# Patient Record
Sex: Male | Born: 1935 | Race: White | Hispanic: No | Marital: Married | Smoking: Never smoker
Health system: Southern US, Community
[De-identification: ages and names within clinical notes are randomized; demographics above are authoritative.]

## PROBLEM LIST (undated history)

## (undated) DIAGNOSIS — E785 Hyperlipidemia, unspecified: Secondary | ICD-10-CM

## (undated) DIAGNOSIS — I2510 Atherosclerotic heart disease of native coronary artery without angina pectoris: Secondary | ICD-10-CM

## (undated) DIAGNOSIS — H269 Unspecified cataract: Secondary | ICD-10-CM

## (undated) DIAGNOSIS — K219 Gastro-esophageal reflux disease without esophagitis: Secondary | ICD-10-CM

## (undated) DIAGNOSIS — C801 Malignant (primary) neoplasm, unspecified: Secondary | ICD-10-CM

## (undated) DIAGNOSIS — I10 Essential (primary) hypertension: Secondary | ICD-10-CM

## (undated) DIAGNOSIS — C9110 Chronic lymphocytic leukemia of B-cell type not having achieved remission: Secondary | ICD-10-CM

## (undated) DIAGNOSIS — I509 Heart failure, unspecified: Secondary | ICD-10-CM

## (undated) HISTORY — DX: Heart failure, unspecified: I50.9

## (undated) HISTORY — PX: OTHER SURGICAL HISTORY: SHX169

## (undated) HISTORY — DX: Hyperlipidemia, unspecified: E78.5

## (undated) HISTORY — PX: HERNIA REPAIR: SHX51

## (undated) HISTORY — DX: Gastro-esophageal reflux disease without esophagitis: K21.9

## (undated) HISTORY — DX: Essential (primary) hypertension: I10

## (undated) HISTORY — DX: Malignant (primary) neoplasm, unspecified: C80.1

## (undated) HISTORY — PX: EYE SURGERY: SHX253

## (undated) HISTORY — PX: APPENDECTOMY: SHX54

## (undated) HISTORY — DX: Chronic lymphocytic leukemia of B-cell type not having achieved remission: C91.10

## (undated) HISTORY — DX: Unspecified cataract: H26.9

## (undated) HISTORY — DX: Atherosclerotic heart disease of native coronary artery without angina pectoris: I25.10

---

## 1978-05-30 DIAGNOSIS — M130 Polyarthritis, unspecified: Secondary | ICD-10-CM | POA: Insufficient documentation

## 1998-07-03 ENCOUNTER — Inpatient Hospital Stay (HOSPITAL_COMMUNITY): Admission: AD | Admit: 1998-07-03 | Discharge: 1998-07-08 | Payer: Self-pay | Admitting: Cardiology

## 1998-07-03 ENCOUNTER — Encounter: Payer: Self-pay | Admitting: Cardiology

## 1998-07-07 ENCOUNTER — Encounter: Payer: Self-pay | Admitting: Cardiology

## 2008-02-26 DIAGNOSIS — E559 Vitamin D deficiency, unspecified: Secondary | ICD-10-CM | POA: Insufficient documentation

## 2011-04-14 DIAGNOSIS — D7289 Other specified disorders of white blood cells: Secondary | ICD-10-CM | POA: Insufficient documentation

## 2014-12-09 DIAGNOSIS — C801 Malignant (primary) neoplasm, unspecified: Secondary | ICD-10-CM | POA: Insufficient documentation

## 2014-12-09 DIAGNOSIS — I428 Other cardiomyopathies: Secondary | ICD-10-CM | POA: Insufficient documentation

## 2014-12-09 DIAGNOSIS — E785 Hyperlipidemia, unspecified: Secondary | ICD-10-CM | POA: Insufficient documentation

## 2014-12-09 DIAGNOSIS — I10 Essential (primary) hypertension: Secondary | ICD-10-CM | POA: Insufficient documentation

## 2014-12-09 DIAGNOSIS — I2510 Atherosclerotic heart disease of native coronary artery without angina pectoris: Secondary | ICD-10-CM | POA: Insufficient documentation

## 2014-12-09 DIAGNOSIS — I429 Cardiomyopathy, unspecified: Secondary | ICD-10-CM | POA: Insufficient documentation

## 2014-12-09 HISTORY — DX: Atherosclerotic heart disease of native coronary artery without angina pectoris: I25.10

## 2014-12-09 HISTORY — DX: Hyperlipidemia, unspecified: E78.5

## 2014-12-09 HISTORY — DX: Essential (primary) hypertension: I10

## 2014-12-09 HISTORY — DX: Cardiomyopathy, unspecified: I42.9

## 2016-05-31 DIAGNOSIS — J0100 Acute maxillary sinusitis, unspecified: Secondary | ICD-10-CM | POA: Diagnosis not present

## 2016-09-05 DIAGNOSIS — I2510 Atherosclerotic heart disease of native coronary artery without angina pectoris: Secondary | ICD-10-CM | POA: Diagnosis not present

## 2016-09-05 DIAGNOSIS — E785 Hyperlipidemia, unspecified: Secondary | ICD-10-CM | POA: Diagnosis not present

## 2016-09-05 DIAGNOSIS — I429 Cardiomyopathy, unspecified: Secondary | ICD-10-CM | POA: Diagnosis not present

## 2016-09-05 DIAGNOSIS — I10 Essential (primary) hypertension: Secondary | ICD-10-CM | POA: Diagnosis not present

## 2016-09-13 DIAGNOSIS — E785 Hyperlipidemia, unspecified: Secondary | ICD-10-CM | POA: Diagnosis not present

## 2016-09-13 DIAGNOSIS — I429 Cardiomyopathy, unspecified: Secondary | ICD-10-CM | POA: Diagnosis not present

## 2016-09-13 DIAGNOSIS — I10 Essential (primary) hypertension: Secondary | ICD-10-CM | POA: Diagnosis not present

## 2016-09-13 DIAGNOSIS — I2510 Atherosclerotic heart disease of native coronary artery without angina pectoris: Secondary | ICD-10-CM | POA: Diagnosis not present

## 2016-09-19 DIAGNOSIS — E875 Hyperkalemia: Secondary | ICD-10-CM | POA: Diagnosis not present

## 2016-10-03 DIAGNOSIS — H353121 Nonexudative age-related macular degeneration, left eye, early dry stage: Secondary | ICD-10-CM | POA: Diagnosis not present

## 2016-10-04 DIAGNOSIS — K219 Gastro-esophageal reflux disease without esophagitis: Secondary | ICD-10-CM | POA: Diagnosis not present

## 2016-10-04 DIAGNOSIS — Z79899 Other long term (current) drug therapy: Secondary | ICD-10-CM | POA: Diagnosis not present

## 2016-10-04 DIAGNOSIS — I5023 Acute on chronic systolic (congestive) heart failure: Secondary | ICD-10-CM | POA: Diagnosis not present

## 2016-10-04 DIAGNOSIS — R200 Anesthesia of skin: Secondary | ICD-10-CM | POA: Diagnosis not present

## 2016-10-04 DIAGNOSIS — I429 Cardiomyopathy, unspecified: Secondary | ICD-10-CM | POA: Diagnosis not present

## 2016-10-04 DIAGNOSIS — Z7982 Long term (current) use of aspirin: Secondary | ICD-10-CM | POA: Diagnosis not present

## 2016-10-04 DIAGNOSIS — R0602 Shortness of breath: Secondary | ICD-10-CM | POA: Diagnosis not present

## 2016-10-04 DIAGNOSIS — I509 Heart failure, unspecified: Secondary | ICD-10-CM | POA: Diagnosis not present

## 2016-10-04 DIAGNOSIS — I110 Hypertensive heart disease with heart failure: Secondary | ICD-10-CM | POA: Diagnosis not present

## 2016-10-04 DIAGNOSIS — R008 Other abnormalities of heart beat: Secondary | ICD-10-CM | POA: Diagnosis not present

## 2016-10-04 DIAGNOSIS — I2510 Atherosclerotic heart disease of native coronary artery without angina pectoris: Secondary | ICD-10-CM | POA: Diagnosis not present

## 2016-10-04 DIAGNOSIS — I252 Old myocardial infarction: Secondary | ICD-10-CM | POA: Diagnosis not present

## 2016-10-04 DIAGNOSIS — E7800 Pure hypercholesterolemia, unspecified: Secondary | ICD-10-CM | POA: Diagnosis not present

## 2016-10-04 DIAGNOSIS — I493 Ventricular premature depolarization: Secondary | ICD-10-CM | POA: Diagnosis not present

## 2016-10-04 DIAGNOSIS — E876 Hypokalemia: Secondary | ICD-10-CM | POA: Diagnosis not present

## 2016-10-05 DIAGNOSIS — R079 Chest pain, unspecified: Secondary | ICD-10-CM | POA: Diagnosis not present

## 2016-10-05 DIAGNOSIS — Z8249 Family history of ischemic heart disease and other diseases of the circulatory system: Secondary | ICD-10-CM | POA: Diagnosis not present

## 2016-10-05 DIAGNOSIS — I2510 Atherosclerotic heart disease of native coronary artery without angina pectoris: Secondary | ICD-10-CM | POA: Diagnosis not present

## 2016-10-05 DIAGNOSIS — I493 Ventricular premature depolarization: Secondary | ICD-10-CM | POA: Diagnosis not present

## 2016-10-05 DIAGNOSIS — I252 Old myocardial infarction: Secondary | ICD-10-CM | POA: Diagnosis not present

## 2016-10-05 DIAGNOSIS — R0602 Shortness of breath: Secondary | ICD-10-CM | POA: Diagnosis not present

## 2016-10-05 DIAGNOSIS — E876 Hypokalemia: Secondary | ICD-10-CM | POA: Diagnosis not present

## 2016-10-05 DIAGNOSIS — I509 Heart failure, unspecified: Secondary | ICD-10-CM | POA: Diagnosis not present

## 2016-10-06 DIAGNOSIS — I2510 Atherosclerotic heart disease of native coronary artery without angina pectoris: Secondary | ICD-10-CM | POA: Diagnosis not present

## 2016-10-06 DIAGNOSIS — I493 Ventricular premature depolarization: Secondary | ICD-10-CM | POA: Diagnosis not present

## 2016-10-06 DIAGNOSIS — R079 Chest pain, unspecified: Secondary | ICD-10-CM | POA: Diagnosis not present

## 2016-10-06 DIAGNOSIS — E876 Hypokalemia: Secondary | ICD-10-CM | POA: Diagnosis not present

## 2016-10-06 DIAGNOSIS — R0602 Shortness of breath: Secondary | ICD-10-CM | POA: Diagnosis not present

## 2016-10-06 DIAGNOSIS — I509 Heart failure, unspecified: Secondary | ICD-10-CM | POA: Diagnosis not present

## 2016-10-06 DIAGNOSIS — I252 Old myocardial infarction: Secondary | ICD-10-CM | POA: Diagnosis not present

## 2016-10-06 DIAGNOSIS — I428 Other cardiomyopathies: Secondary | ICD-10-CM | POA: Diagnosis not present

## 2016-10-07 DIAGNOSIS — I509 Heart failure, unspecified: Secondary | ICD-10-CM | POA: Diagnosis not present

## 2016-10-07 DIAGNOSIS — Z79899 Other long term (current) drug therapy: Secondary | ICD-10-CM | POA: Diagnosis not present

## 2016-10-07 DIAGNOSIS — R9430 Abnormal result of cardiovascular function study, unspecified: Secondary | ICD-10-CM | POA: Diagnosis not present

## 2016-10-07 DIAGNOSIS — I252 Old myocardial infarction: Secondary | ICD-10-CM | POA: Diagnosis not present

## 2016-10-07 DIAGNOSIS — I5020 Unspecified systolic (congestive) heart failure: Secondary | ICD-10-CM | POA: Diagnosis not present

## 2016-10-07 DIAGNOSIS — R0602 Shortness of breath: Secondary | ICD-10-CM | POA: Diagnosis not present

## 2016-10-07 DIAGNOSIS — R9439 Abnormal result of other cardiovascular function study: Secondary | ICD-10-CM

## 2016-10-07 DIAGNOSIS — I429 Cardiomyopathy, unspecified: Secondary | ICD-10-CM | POA: Diagnosis not present

## 2016-10-07 DIAGNOSIS — E669 Obesity, unspecified: Secondary | ICD-10-CM | POA: Diagnosis not present

## 2016-10-07 DIAGNOSIS — H9190 Unspecified hearing loss, unspecified ear: Secondary | ICD-10-CM | POA: Diagnosis not present

## 2016-10-07 DIAGNOSIS — R9431 Abnormal electrocardiogram [ECG] [EKG]: Secondary | ICD-10-CM | POA: Diagnosis not present

## 2016-10-07 DIAGNOSIS — I493 Ventricular premature depolarization: Secondary | ICD-10-CM | POA: Insufficient documentation

## 2016-10-07 DIAGNOSIS — I2510 Atherosclerotic heart disease of native coronary artery without angina pectoris: Secondary | ICD-10-CM | POA: Diagnosis not present

## 2016-10-07 DIAGNOSIS — E785 Hyperlipidemia, unspecified: Secondary | ICD-10-CM | POA: Diagnosis not present

## 2016-10-07 DIAGNOSIS — Z7982 Long term (current) use of aspirin: Secondary | ICD-10-CM | POA: Diagnosis not present

## 2016-10-07 DIAGNOSIS — E876 Hypokalemia: Secondary | ICD-10-CM | POA: Diagnosis not present

## 2016-10-07 DIAGNOSIS — I447 Left bundle-branch block, unspecified: Secondary | ICD-10-CM | POA: Diagnosis not present

## 2016-10-07 DIAGNOSIS — Z8249 Family history of ischemic heart disease and other diseases of the circulatory system: Secondary | ICD-10-CM | POA: Diagnosis not present

## 2016-10-07 DIAGNOSIS — I5040 Unspecified combined systolic (congestive) and diastolic (congestive) heart failure: Secondary | ICD-10-CM | POA: Diagnosis not present

## 2016-10-07 DIAGNOSIS — I110 Hypertensive heart disease with heart failure: Secondary | ICD-10-CM | POA: Diagnosis not present

## 2016-10-07 HISTORY — DX: Abnormal result of other cardiovascular function study: R94.39

## 2016-10-07 HISTORY — DX: Ventricular premature depolarization: I49.3

## 2016-10-08 DIAGNOSIS — Z955 Presence of coronary angioplasty implant and graft: Secondary | ICD-10-CM | POA: Diagnosis not present

## 2016-10-08 DIAGNOSIS — E784 Other hyperlipidemia: Secondary | ICD-10-CM | POA: Diagnosis not present

## 2016-10-08 DIAGNOSIS — I428 Other cardiomyopathies: Secondary | ICD-10-CM | POA: Diagnosis not present

## 2016-10-08 DIAGNOSIS — I2510 Atherosclerotic heart disease of native coronary artery without angina pectoris: Secondary | ICD-10-CM | POA: Diagnosis not present

## 2016-10-08 DIAGNOSIS — I119 Hypertensive heart disease without heart failure: Secondary | ICD-10-CM | POA: Diagnosis not present

## 2016-10-08 DIAGNOSIS — I493 Ventricular premature depolarization: Secondary | ICD-10-CM | POA: Diagnosis not present

## 2016-10-11 ENCOUNTER — Encounter: Payer: Self-pay | Admitting: *Deleted

## 2016-10-11 ENCOUNTER — Other Ambulatory Visit: Payer: Self-pay | Admitting: *Deleted

## 2016-10-11 DIAGNOSIS — I2510 Atherosclerotic heart disease of native coronary artery without angina pectoris: Secondary | ICD-10-CM

## 2016-10-11 NOTE — Patient Outreach (Signed)
[facility]) Care Management  [place]  10/12/2016   Nathan Hall 03/28/1935 [id number]  Subjective:  HTA referral; recent discharge 10/08/2016 from [facility]:  Subjective/objective: Telephone call to patient who was advised of reason for call & [facility] care management services. HIPPA verification received from patient. Patient voices that he was recently in hospital for heart problem & had 2 stents placed. States he is feeling well now & is walking without problems. States he looks forward to going back to rehab center at hospital where he was going 4-5 times weekly and exercising on treadmill.   Voices that he has not had any problems since hospital discharge. States he has all of medications & he is taking as prescribed. States he has follow up appointment scheduled for heart follow up & plans to make appointment for primary care follow up. States spouse will provide transportation. Patient states he also attend appointments at [facility] in [place] twice yearly where he sees primary doctor & hematologist (states he has CLL which is under control). States other medical conditions include HTN, GERD, Macular degeneration, HF, CLL.  Patient voices that he is checking blood pressure, pulse, & weight and recording daily. States he has not had any chest pain or shortness of breath. Patient knows when to call 911 and when to reports symptoms that require that he call his MD.  Patient has support from spouse and other family members as needed.   Objective: See completed health assessments:  Encounter Medications:  Outpatient Encounter Prescriptions as of 10/11/2016  Medication Sig Note  . aspirin EC 81 MG tablet Take 81 mg by mouth daily.   Marland Kitchen atorvastatin (LIPITOR) 80 MG tablet Take 80 mg by mouth daily.   . carvedilol (COREG) 3.125 MG tablet Take 3.125 mg by mouth 2 (two) times daily with a meal.   . furosemide (LASIX) 40 MG tablet Take 40 mg by mouth  daily as needed for edema.   . multivitamin-lutein (OCUVITE-LUTEIN) CAPS capsule Take 1 capsule by mouth daily.   . Omega-3 Fatty Acids (FISH OIL) 1000 MG CAPS Take 1 capsule by mouth once.   . pantoprazole (PROTONIX) 40 MG tablet Take 40 mg by mouth daily.   . ranitidine (ZANTAC) 150 MG tablet Take 150 mg by mouth at bedtime.   Marland Kitchen spironolactone (ALDACTONE) 25 MG tablet Take 25 mg by mouth daily. 10/11/2016: Patient states he is taking once daily if needed  . ticagrelor (BRILINTA) 90 MG TABS tablet Take by mouth 2 (two) times daily.    No facility-administered encounter medications on file as of 10/11/2016.     Functional Status:  In your present state of health, do you have any difficulty performing the following activities: 10/11/2016 10/11/2016  Hearing? (No Data) Y  Vision? - N  Difficulty concentrating or making decisions? - N  Walking or climbing stairs? - N  Dressing or bathing? - N  Doing errands, shopping? - N  Some recent data might be hidden    Fall/Depression Screening: Fall Risk  10/11/2016  Falls in the past year? No  Risk for fall due to : Impaired balance/gait  Risk for fall due to (comments): some balance trouble   PHQ 2/9 Scores 10/11/2016  PHQ - 2 Score 0    Assessment: patient with CAD; stents placed during hospital stay. Transition of care calls would be of value to patient. Patient agrees to [facility] care management services.  Plan: Care plan completed; see below  [facility]  CM Care Plan Problem One     Most Recent Value  Care Plan Problem One  At risk for hospital readmission due to recent admission for heart procedure  Role Documenting the Problem One  Care Management Telephonic Coordinator  Care Plan for Problem One  Active  THN Long Term Goal (31-90 days)  Avoid readmission within 2 weeks of patient's recent discharge  THN Long Term Goal Start Date  10/11/16  Interventions for Problem One Long Term Goal  Explain to pt importance of hospital f/u MD appointments, med  adherence, early reporting of abnormal signs & symptoms  THN CM Short Term Goal #1 (0-30 days)  Pt will report f/u Appt date with PCP within 2 weeks   THN CM Short Term Goal #1 Start Date  10/11/16  Interventions for Short Term Goal #1  Verbal explanation of importance of f/u appt following hospital stay,  provide MD phone contact if needed  [facility] CM Short Term Goal #2 (0-30 days)  Pt will report that he is taking meds as prescribed within 2 weeks  THN CM Short Term Goal #2 Start Date  10/11/16  Interventions for Short Term Goal #2  review meds with pt,  advise of importance of med adherence  THN CM Short Term Goal #3 (0-30 days)  Patient will report 2-3 reportable signs to MD within 2 weeks  THN CM Short Term Goal #3 Start Date  10/11/16  Interventions for Short Tern Goal #3  Review abnormal signs that should be reported following stent placement      Involvement letter sent to MD. Introductory packet sent to patient; which includes consent form. Will follow up with patient next week. Appointment set with patient agreement.  Sherrin Daisy, RN BSN CCM Care Management Coordinator [facility] Care Management  [phone]     .

## 2016-10-12 ENCOUNTER — Encounter: Payer: Self-pay | Admitting: *Deleted

## 2016-10-12 DIAGNOSIS — R55 Syncope and collapse: Secondary | ICD-10-CM | POA: Diagnosis not present

## 2016-10-12 DIAGNOSIS — I2510 Atherosclerotic heart disease of native coronary artery without angina pectoris: Secondary | ICD-10-CM | POA: Diagnosis not present

## 2016-10-12 DIAGNOSIS — R0602 Shortness of breath: Secondary | ICD-10-CM | POA: Diagnosis not present

## 2016-10-12 DIAGNOSIS — R079 Chest pain, unspecified: Secondary | ICD-10-CM | POA: Diagnosis not present

## 2016-10-12 DIAGNOSIS — R002 Palpitations: Secondary | ICD-10-CM | POA: Diagnosis not present

## 2016-10-12 DIAGNOSIS — R42 Dizziness and giddiness: Secondary | ICD-10-CM | POA: Diagnosis not present

## 2016-10-12 DIAGNOSIS — I429 Cardiomyopathy, unspecified: Secondary | ICD-10-CM | POA: Diagnosis not present

## 2016-10-12 DIAGNOSIS — J329 Chronic sinusitis, unspecified: Secondary | ICD-10-CM | POA: Diagnosis not present

## 2016-10-12 DIAGNOSIS — H9190 Unspecified hearing loss, unspecified ear: Secondary | ICD-10-CM | POA: Diagnosis not present

## 2016-10-12 DIAGNOSIS — Z8249 Family history of ischemic heart disease and other diseases of the circulatory system: Secondary | ICD-10-CM | POA: Diagnosis not present

## 2016-10-12 DIAGNOSIS — I420 Dilated cardiomyopathy: Secondary | ICD-10-CM | POA: Diagnosis not present

## 2016-10-12 DIAGNOSIS — I252 Old myocardial infarction: Secondary | ICD-10-CM | POA: Diagnosis not present

## 2016-10-12 DIAGNOSIS — I5022 Chronic systolic (congestive) heart failure: Secondary | ICD-10-CM | POA: Diagnosis not present

## 2016-10-12 DIAGNOSIS — E785 Hyperlipidemia, unspecified: Secondary | ICD-10-CM | POA: Diagnosis not present

## 2016-10-12 DIAGNOSIS — Z6828 Body mass index (BMI) 28.0-28.9, adult: Secondary | ICD-10-CM | POA: Diagnosis not present

## 2016-10-12 DIAGNOSIS — Z7982 Long term (current) use of aspirin: Secondary | ICD-10-CM | POA: Diagnosis not present

## 2016-10-12 DIAGNOSIS — E669 Obesity, unspecified: Secondary | ICD-10-CM | POA: Diagnosis not present

## 2016-10-12 DIAGNOSIS — Z955 Presence of coronary angioplasty implant and graft: Secondary | ICD-10-CM | POA: Diagnosis not present

## 2016-10-12 DIAGNOSIS — E784 Other hyperlipidemia: Secondary | ICD-10-CM | POA: Diagnosis not present

## 2016-10-12 DIAGNOSIS — R008 Other abnormalities of heart beat: Secondary | ICD-10-CM | POA: Diagnosis not present

## 2016-10-12 DIAGNOSIS — I119 Hypertensive heart disease without heart failure: Secondary | ICD-10-CM | POA: Diagnosis not present

## 2016-10-12 DIAGNOSIS — R9431 Abnormal electrocardiogram [ECG] [EKG]: Secondary | ICD-10-CM | POA: Diagnosis not present

## 2016-10-12 DIAGNOSIS — I493 Ventricular premature depolarization: Secondary | ICD-10-CM | POA: Diagnosis not present

## 2016-10-12 DIAGNOSIS — I110 Hypertensive heart disease with heart failure: Secondary | ICD-10-CM | POA: Diagnosis not present

## 2016-10-12 DIAGNOSIS — J019 Acute sinusitis, unspecified: Secondary | ICD-10-CM | POA: Diagnosis not present

## 2016-10-12 DIAGNOSIS — I472 Ventricular tachycardia: Secondary | ICD-10-CM | POA: Diagnosis not present

## 2016-10-13 ENCOUNTER — Encounter: Payer: Self-pay | Admitting: *Deleted

## 2016-10-13 DIAGNOSIS — I493 Ventricular premature depolarization: Secondary | ICD-10-CM | POA: Diagnosis not present

## 2016-10-13 DIAGNOSIS — I501 Left ventricular failure: Secondary | ICD-10-CM | POA: Diagnosis not present

## 2016-10-13 DIAGNOSIS — Z8249 Family history of ischemic heart disease and other diseases of the circulatory system: Secondary | ICD-10-CM | POA: Diagnosis not present

## 2016-10-13 DIAGNOSIS — R42 Dizziness and giddiness: Secondary | ICD-10-CM | POA: Insufficient documentation

## 2016-10-13 DIAGNOSIS — Z955 Presence of coronary angioplasty implant and graft: Secondary | ICD-10-CM | POA: Diagnosis not present

## 2016-10-13 DIAGNOSIS — I2510 Atherosclerotic heart disease of native coronary artery without angina pectoris: Secondary | ICD-10-CM | POA: Diagnosis not present

## 2016-10-13 DIAGNOSIS — I6523 Occlusion and stenosis of bilateral carotid arteries: Secondary | ICD-10-CM | POA: Diagnosis not present

## 2016-10-13 DIAGNOSIS — I119 Hypertensive heart disease without heart failure: Secondary | ICD-10-CM | POA: Diagnosis not present

## 2016-10-13 DIAGNOSIS — E784 Other hyperlipidemia: Secondary | ICD-10-CM | POA: Diagnosis not present

## 2016-10-13 DIAGNOSIS — I252 Old myocardial infarction: Secondary | ICD-10-CM | POA: Diagnosis not present

## 2016-10-13 DIAGNOSIS — R55 Syncope and collapse: Secondary | ICD-10-CM | POA: Diagnosis not present

## 2016-10-13 HISTORY — DX: Dizziness and giddiness: R42

## 2016-10-14 DIAGNOSIS — R55 Syncope and collapse: Secondary | ICD-10-CM | POA: Diagnosis not present

## 2016-10-14 DIAGNOSIS — R42 Dizziness and giddiness: Secondary | ICD-10-CM | POA: Diagnosis not present

## 2016-10-14 DIAGNOSIS — I501 Left ventricular failure: Secondary | ICD-10-CM | POA: Diagnosis not present

## 2016-10-14 DIAGNOSIS — Z955 Presence of coronary angioplasty implant and graft: Secondary | ICD-10-CM | POA: Diagnosis not present

## 2016-10-14 DIAGNOSIS — I119 Hypertensive heart disease without heart failure: Secondary | ICD-10-CM | POA: Diagnosis not present

## 2016-10-14 DIAGNOSIS — I2510 Atherosclerotic heart disease of native coronary artery without angina pectoris: Secondary | ICD-10-CM | POA: Diagnosis not present

## 2016-10-14 DIAGNOSIS — I491 Atrial premature depolarization: Secondary | ICD-10-CM | POA: Diagnosis not present

## 2016-10-14 DIAGNOSIS — E784 Other hyperlipidemia: Secondary | ICD-10-CM | POA: Diagnosis not present

## 2016-10-17 DIAGNOSIS — Z6827 Body mass index (BMI) 27.0-27.9, adult: Secondary | ICD-10-CM | POA: Diagnosis not present

## 2016-10-17 DIAGNOSIS — I519 Heart disease, unspecified: Secondary | ICD-10-CM | POA: Diagnosis not present

## 2016-10-17 DIAGNOSIS — I499 Cardiac arrhythmia, unspecified: Secondary | ICD-10-CM | POA: Diagnosis not present

## 2016-10-19 ENCOUNTER — Other Ambulatory Visit: Payer: Self-pay | Admitting: *Deleted

## 2016-10-19 NOTE — Patient Outreach (Signed)
[facility]) Care Management  10/19/2016  Nathan Hall 1934-06-18 [id number]  #2 Transition of Care call follow up. Telephone call to patient: HIPPA verification received. Patient advised that he was recently readmitted to [facility] from 5/16-5/18/2018.  States he had symptoms of shortness of breath, lightheadedness, and irregular heart beat(PVC's).  States he went to [facility] in [place] then to emergency department at [facility] where he was admitted. States he is home now & has not had any problems with shortness of breath, dizziness or chest pain since discharge . States he had hospital follow up appointment with Dr. Ann Held 5/21 and heart monitor was placed which he will wear for 10 days to detect irregular heart beat.  He is currently restricted from driving, mowing grass, exercise at cardiac rehab classes. States does a lot of walking and has not had any problems with that.   Patient states several new medications were added to his list since last admission (Amoxicillin, Cozaar, Claritin, Buspirone  & he has had no problems getting medications.. States he is managing own medications. Reviewed medications with patient.    Patient states he continues to check blood pressure, pulse and weight daily.   Assessment:  Pt with hospital readmission (5/16-5/18) within 30 days of previous discharge (5/12). Recent cardiac incident that required stent placement at [facility] following transfer from [facility]. Currently wearing cardiac monitoring device x 10 days. Patient has support from spouse. Other medical hx includes -HTN, GERD, Macular degeneration, HF, CLL, CAD Patient agrees to [facility] care management services.  Plan: Close out previous telephonic Transition of care case due to readmission . Refer to [facility] care coordinator for complex case management. Send to care management assistant to assign.   Sherrin Daisy, RN BSN  [place] Management Coordinator [facility] Care Management  [phone]

## 2016-10-25 DIAGNOSIS — I493 Ventricular premature depolarization: Secondary | ICD-10-CM | POA: Diagnosis not present

## 2016-10-25 DIAGNOSIS — E785 Hyperlipidemia, unspecified: Secondary | ICD-10-CM | POA: Diagnosis not present

## 2016-10-25 DIAGNOSIS — I2510 Atherosclerotic heart disease of native coronary artery without angina pectoris: Secondary | ICD-10-CM | POA: Diagnosis not present

## 2016-10-25 DIAGNOSIS — I10 Essential (primary) hypertension: Secondary | ICD-10-CM | POA: Diagnosis not present

## 2016-10-25 DIAGNOSIS — I420 Dilated cardiomyopathy: Secondary | ICD-10-CM | POA: Diagnosis not present

## 2016-10-25 DIAGNOSIS — R42 Dizziness and giddiness: Secondary | ICD-10-CM | POA: Diagnosis not present

## 2016-10-26 ENCOUNTER — Other Ambulatory Visit: Payer: Self-pay

## 2016-10-26 NOTE — Patient Outreach (Signed)
Transition of care call: Patient returned call. States that he is doing well. Reports the he had a follow up appointment with cardiology yesterday. Reports that he has turned in his heart monitor. Reports he has another follow in 2 week. States that he is taking his medications as prescribed. States that he was drinking coffee with his medication to decrease his dizziness.  States the coffee caused PVCs.  Reports that he feels he is doing well.    Offered home visit for tomorrow. Patient has tentatively accepted but wants a call in the morning. States that his sister in law is real sick.  PLAN: will call patient in the am. If he plans to be home will see patient at 10:30am.  Tomasa Rand, RN, BSN, CEN [place] Coordinator [phone]

## 2016-10-26 NOTE — Patient Outreach (Signed)
Transition of care: New referral from telephonic case manager for readmission.  Placed call to patient. No answer. Left a message requesting a call back. PLAN: Will continue outreach efforts.  Tomasa Rand, RN, BSN, CEN [facility] [employer] [phone]

## 2016-10-27 ENCOUNTER — Other Ambulatory Visit: Payer: Self-pay

## 2016-10-27 NOTE — Patient Outreach (Signed)
[facility]) Care Management   10/27/2016  Nathan Hall [birth year] [id number]  JSON Nathan Hall is an 82 y.o. male  10:30 am Arrived for home visit. Wife [name] present. Subjective:  Patient reports that he was doing well after stent placements until he developed dizziness a couple days post discharge.  Patient reports that he thinks the [place].  States every time he takes this medications he gets dizzy. Reports he feels bad for about an hour after he takes it.  Also states that he was told at [facility] to take it with coffee.  He states that did not help. Reports that he took the brilinta last night with M&M's and felt like he was smoothering.  Reports that he saw primary MD and cardiology and reported his concerns. Primary MD placed patient on heart monitor which patient mailed back on 10/26/2016.   Objective:  Awake and alert. Home neat and clean. Ambulating without difficulty.  Vitals:   10/27/16 1059  BP: 138/60  Pulse: (!) 58  Resp: 18  SpO2: 97%  Weight: 176 lb (79.8 kg)  Height: 1.702 m (5\' 7" )    Review of Systems  Constitutional: Negative.   HENT: Negative.   Eyes:       Wears glasses  Respiratory:       Smothering feeling last night  Cardiovascular: Negative.   Gastrointestinal: Negative.   Genitourinary: Positive for frequency.  Musculoskeletal: Negative.        Recent right wrist pain  Skin: Negative.   Neurological: Positive for dizziness.  Endo/Heme/Allergies: Bruises/bleeds easily.  Psychiatric/Behavioral: Negative.     Physical Exam  Constitutional: He is oriented to person, place, and time. He appears well-developed and well-nourished.  Neck: Normal range of motion.  Cardiovascular: Intact distal pulses.   Irregular rate  Respiratory: Effort normal and breath sounds normal.  GI: Soft. Bowel sounds are normal.  Musculoskeletal: Normal range of motion. He exhibits no edema.  Neurological: He is alert and oriented to person, place,  and time.  Skin: Skin is warm and dry.  Psychiatric: He has a normal mood and affect. His behavior is normal. Judgment and thought content normal.    Encounter Medications:   Outpatient Encounter Prescriptions as of 10/27/2016  Medication Sig Note  . amoxicillin (AMOXIL) 875 MG tablet Take 875 mg by mouth 2 (two) times daily.   Marland Kitchen aspirin EC 81 MG tablet Take 81 mg by mouth daily.   Marland Kitchen atorvastatin (LIPITOR) 80 MG tablet Take 80 mg by mouth daily.   . busPIRone (BUSPAR) 5 MG tablet Take 5 mg by mouth 3 (three) times daily as needed.   . carvedilol (COREG) 3.125 MG tablet Take 6.25 mg by mouth 2 (two) times daily with a meal.    . furosemide (LASIX) 40 MG tablet Take 40 mg by mouth daily as needed for edema.   Marland Kitchen loratadine (CLARITIN) 10 MG tablet Take 10 mg by mouth daily.   Marland Kitchen losartan (COZAAR) 25 MG tablet Take 25 mg by mouth daily.   . multivitamin-lutein (OCUVITE-LUTEIN) CAPS capsule Take 1 capsule by mouth daily.   . Omega-3 Fatty Acids (FISH OIL) 1000 MG CAPS Take 1 capsule by mouth once.   . pantoprazole (PROTONIX) 40 MG tablet Take 40 mg by mouth daily.   . potassium chloride SA (K-DUR,KLOR-CON) 20 MEQ tablet Take 20 mEq by mouth daily.   . ranitidine (ZANTAC) 150 MG tablet Take 150 mg by mouth at bedtime.   . ranolazine (RANEXA)  500 MG 12 hr tablet Take 500 mg by mouth.   . spironolactone (ALDACTONE) 25 MG tablet Take 25 mg by mouth daily. 10/11/2016: Patient states he is taking once daily if needed  . ticagrelor (BRILINTA) 90 MG TABS tablet Take by mouth 2 (two) times daily.    No facility-administered encounter medications on file as of 10/27/2016.     Functional Status:   In your present state of health, do you have any difficulty performing the following activities: 10/27/2016 10/11/2016  Hearing? N (No Data)  Vision? N -  Difficulty concentrating or making decisions? N -  Walking or climbing stairs? Y -  Dressing or bathing? N -  Doing errands, shopping? N -  Preparing Food  and eating ? N -  Using the Toilet? N -  In the past six months, have you accidently leaked urine? N -  Do you have problems with loss of bowel control? N -  Managing your Medications? N -  Managing your Finances? N -  Some recent data might be hidden    Fall/Depression Screening:    Fall Risk  10/27/2016 10/11/2016  Falls in the past year? No No  Risk for fall due to : - Impaired balance/gait  Risk for fall due to (comments): - some balance trouble   PHQ 2/9 Scores 10/27/2016 10/11/2016  PHQ - 2 Score 0 0    Assessment:   (1) reviewed [facility]N transition of care program.  Provided new patient packet. Reviewed 24 hour nurse line and [facility] calendar.  Consent obtained.  (2) new onset of dizziness since hospital discharge.  (3) CHF:  Weighing daily and following low salt diet. (4) concerned about medication side effects and interactions.   Plan:  (1) consent scanned into chart. (2) will send this note to MD. Cardiology and Primary MD already aware.  Reviewed signs and symptoms of heart attack and stroke with patient. Encouraged patient to get emergency attention for any of those symptoms. (3) Reviewed importance of continuing daily weights and following low salt diet.  Reviewed heart failure zones. (4) placed referral to [place] to assist with medication concerns.   Care planning and goal setting during home visit and patients primary goal is to avoid a readmission.  Will contact patient in 1 week for transition of care follow up. Will send this note and barrier letter to MD.   [facility] CM Care Plan Problem One     Most Recent Value  Care Plan Problem One  At risk for hospital readmission due to recent admission for heart procedure  Role Documenting the Problem One  Care Management [place] for Problem One  Active  [facility] Long Term Goal   Patient will report no readmissions related to heart procedure in the next 60 days.   THN Long Term Goal Start Date  10/27/16  Interventions  for Problem One Long Term Goal  Reviewed THN transiton of care program. Reviewed importance of reporting signs and symptoms to MD.   [facility] CM Short Term Goal #1   Patient will report decrease in dizziness in the next 14 days.   THN CM Short Term Goal #1 Start Date  10/27/16  Interventions for Short Term Goal #1  Reviewed early recognition of problems and symptoms.  Pharmacy referral placed to review patients concern for medications. Reviewed signs and symptoms of MI and CVA.    THN CM Short Term Goal #2   Patient will weigh daily and record in [facility]  calendar for the next 30 days.   THN CM Short Term Goal #2 Start Date  10/27/16  Interventions for Short Term Goal #2  Reviewed CHF zones. Reviewed with patient the importance of taking all medications as prescribed.      Tomasa Rand, RN, BSN, CEN [facility] [employer] [phone]

## 2016-10-27 NOTE — Patient Outreach (Signed)
Care Coordination: 0900 Placed call to patient to confirm his appointment later today. Patient reports he will be home.  PLAN: will see patient for home visit today.   Tomasa Rand, RN, BSN, CEN [facility] [employer] [phone]

## 2016-10-28 ENCOUNTER — Other Ambulatory Visit: Payer: Self-pay

## 2016-10-28 ENCOUNTER — Ambulatory Visit: Payer: Self-pay | Admitting: Pharmacist

## 2016-10-28 ENCOUNTER — Other Ambulatory Visit: Payer: Self-pay | Admitting: Pharmacist

## 2016-10-28 NOTE — Patient Outreach (Addendum)
[facility]) Care Management  10/28/2016  Nathan Hall 02/10/1935 [id number]   82 year old male with CAD, cardiomyopathy (EF 30% 5/'18) PVCs, HTN, and HLD with recent cardiac catheterization s/p drug-eluting stents to LAD and circumflex artery in May 2018.  Patient started on ticagrelor and is now complaining of dizziness with administration.  Patient also started on several other new medications after hospitalization.  [facility] pharmacy consulted for medication review.    Per review of Care Everywhere notes:  Cardiologist office visit on 10/25/2016 due to concerns with SOB, pain, palpitations, and dizziness after catheterization.Patient had been told previously by someone to take coffee to prevent these side effects.  Cardiologist counseled patient to stop taking coffee.  Magnesium and potassium WNL.  Carvedilol dose increased. Patient has follow-up visit in 2 weeks.      Reviewed patient also with [facility] RN Tomasa Rand who visited patient yesterday.  Relayed that patient is very concerned about side effects from ticagrelor of feeling numb and dizzy.    Subjective:   10:37AM Successful outreach phone-call to patient this morning.  HIPAA identifiers verified. Medication reconciliation performed with patient. Patient stated he was still having side effects when taking ticagrelor including feeling "kind of numb" in the legs and "feeling funny, it's hard to describe."  He stated that these side effects used to last an hour after each dose but now they only lasted about 30 minutes.  Patient also stated he had tried taking his medication with or without food and it made no difference in how he felt.  He also said he had experienced shortness of breath initially with ticagrelor but it had resolved.  Patient unsure of how often he was taking spironolactone.     Objective:    Outpatient Encounter Prescriptions as of 10/28/2016  Medication Sig Note  . amoxicillin (AMOXIL) 875 MG tablet Take 875  mg by mouth 2 (two) times daily.   Marland Kitchen aspirin EC 81 MG tablet Take 81 mg by mouth daily.   Marland Kitchen atorvastatin (LIPITOR) 80 MG tablet Take 80 mg by mouth daily.   . busPIRone (BUSPAR) 5 MG tablet Take 5 mg by mouth 3 (three) times daily as needed.   . carvedilol (COREG) 3.125 MG tablet Take 6.25 mg by mouth 2 (two) times daily with a meal.    . furosemide (LASIX) 40 MG tablet Take 40 mg by mouth daily as needed for edema.   Marland Kitchen loratadine (CLARITIN) 10 MG tablet Take 10 mg by mouth daily.   Marland Kitchen losartan (COZAAR) 25 MG tablet Take 25 mg by mouth daily.   . multivitamin-lutein (OCUVITE-LUTEIN) CAPS capsule Take 1 capsule by mouth daily.   . Omega-3 Fatty Acids (FISH OIL) 1000 MG CAPS Take 1 capsule by mouth once.   . pantoprazole (PROTONIX) 40 MG tablet Take 40 mg by mouth daily.   . potassium chloride SA (K-DUR,KLOR-CON) 20 MEQ tablet Take 20 mEq by mouth daily.   . ranitidine (ZANTAC) 150 MG tablet Take 150 mg by mouth at bedtime.   . ranolazine (RANEXA) 500 MG 12 hr tablet Take 500 mg by mouth.   . spironolactone (ALDACTONE) 25 MG tablet Take 25 mg by mouth daily. 10/11/2016: Patient states he is taking once daily if needed  . ticagrelor (BRILINTA) 90 MG TABS tablet Take by mouth 2 (two) times daily.    No facility-administered encounter medications on file as of 10/28/2016.    Assessment:  Drugs sorted by system: Cardiovascular: Aspirin, ticagrelor, atorvastatin, carvedilol,  furosemide, losartan, fish oil, ranolazine, spironolactone  Pulmonary/Allergy: Loratadine  Gastrointestinal: Pantoprazole, ranitidine  Vitamins/Minerals: Ocuvite-lutein  Issues noted:   Patient unsure if taking antibiotic and buspirone.   Patient unsure how he takes spironolactone  Side effects from ticagrelor (dizziness, numbness, SOB)  I counseled patient on signs and symptoms of a stroke and to monitor for bleeding with ticagrelor.  We discussed that the side effects he was experiencing may continue to resolve over  time but if they do not, he should tell his doctor at his follow-up visit in 2 weeks or sooner if needed.  Counseled patient to review the spironolactone instructions on his bottle to clarify how often to take (per refill reports, the last instructions were BID).  Patient will check his bottles regarding antibiotic and buspirone.   Plan: Route note to cardiologist Call patient next week to follow-up on ticagrelor side effects and to complete medication reconciliation  Ralene Bathe, PharmD, [place] [phone]

## 2016-10-28 NOTE — Patient Outreach (Signed)
Telephone call from patient: Patient called and states that he is feeling worse today. Reports jaw pain and dizziness.  Reports BP 142/80's.  PLAN: recommend patient call MD or go to the ED.  Tomasa Rand, RN, BSN, CEN [facility] [employer] [phone]

## 2016-10-31 DIAGNOSIS — Z955 Presence of coronary angioplasty implant and graft: Secondary | ICD-10-CM | POA: Diagnosis not present

## 2016-10-31 DIAGNOSIS — Z789 Other specified health status: Secondary | ICD-10-CM | POA: Diagnosis not present

## 2016-11-01 ENCOUNTER — Other Ambulatory Visit: Payer: Self-pay | Admitting: Pharmacist

## 2016-11-01 ENCOUNTER — Ambulatory Visit: Payer: Self-pay | Admitting: Pharmacist

## 2016-11-01 NOTE — Patient Outreach (Signed)
[facility]) Care Management  11/01/2016  Nathan Hall 11-10-1934 [id number]  Successful phone-call to patient this afternoon. HIPAA identifiers verified. Patient stated he had an office visit with his primary provider, Dr. Nicki Reaper, yesterday, June 4, and relayed his concerns regarding side effects from ticagrelor (dizziness, SOB, numbness).  Per patient, his provider is going to discuss with his cardiologist about optimizing therapy for him.  I was not able to find any progress notes in Care Everywhere or [facility] from his office visit yesterday.  Patient states he has a cardiologist appointment on June 14th as well to follow-up on his ticagrelor.    Patient stated he felt very bad last Friday afternoon with neck numbness and pain and considered going to the ER but his symptoms improved after taking his "anxiety" medication.  He also noticed he has a new bruise after bumping his arm a few days ago.    Patient states that over the weekend, his side effects from ticagrelor were about the same but that last night and today, he actually felt much better after taking it.  He stated it "didn't bother him too much."  His main concern is that he wants to be up and moving around and dislikes being so inactive.    Counseled patient on resting after his recent stent placement and monitoring closely for signs and symptoms of bleeding with his aspirin and ticagrelor.  Patient aware he can call me with any questions or concerns regarding his medications.      Plan: Call patient in the next 1-2 weeks to follow-up on ticagrelor and updates from his cardiology appointment.    Ralene Bathe, PharmD, [place] [phone]

## 2016-11-03 ENCOUNTER — Other Ambulatory Visit: Payer: Self-pay

## 2016-11-03 DIAGNOSIS — R002 Palpitations: Secondary | ICD-10-CM | POA: Diagnosis not present

## 2016-11-03 NOTE — Patient Outreach (Signed)
Transition of care: Placed call to patient who reports that he is doing great. States that his brilinta was discontinued yesterday and he started taking effient 10mg  once a day. ( starting today)  Reports he feels GREAT. Did not take a dose of brilinta last night.  States he is so happy he is feeling back to normal.   Reports that he saw Dr. Nicki Reaper this week and Dr. Nicki Reaper call the cardiologist.   Patient reports no new concerns.  PLAN: will continue weekly transition of care calls. Encouraged patient to report any concerns to MD.  Reviewed with patient a different case manager would call him next week. Will update [facility] pharmacist.  Tomasa Rand, RN, BSN, CEN [place] Coordinator [phone]

## 2016-11-10 ENCOUNTER — Ambulatory Visit: Payer: Self-pay

## 2016-11-10 ENCOUNTER — Other Ambulatory Visit: Payer: Self-pay | Admitting: *Deleted

## 2016-11-10 DIAGNOSIS — E785 Hyperlipidemia, unspecified: Secondary | ICD-10-CM | POA: Diagnosis not present

## 2016-11-10 DIAGNOSIS — I10 Essential (primary) hypertension: Secondary | ICD-10-CM | POA: Diagnosis not present

## 2016-11-10 DIAGNOSIS — I2510 Atherosclerotic heart disease of native coronary artery without angina pectoris: Secondary | ICD-10-CM | POA: Diagnosis not present

## 2016-11-10 DIAGNOSIS — I429 Cardiomyopathy, unspecified: Secondary | ICD-10-CM | POA: Diagnosis not present

## 2016-11-10 DIAGNOSIS — I493 Ventricular premature depolarization: Secondary | ICD-10-CM | POA: Diagnosis not present

## 2016-11-10 NOTE — Patient Outreach (Signed)
[facility]) Care Management  11/10/2016  Nathan Hall 1935/05/30 [id number]   RN outreached to pt today and introduced RN case Freight forwarder and coverage for Tomasa Rand, Therapist, sports. Pt identifiers confirmed prior to the the transition of care update.   Pt very receptive to case manager and reiterated on the recent medication changes indicating he feels "a whole lot better". Pt states he feels well enough to start her cardiac rehabilitation starting the 1st week of July now that the price has been reduced to $40 a month. Pt will follow up with the cardiologist at 1:30pm. Confirmed pt adherent to all his medications and medical appointments with sufficient transportation. No complaints or request today as RN informed pt his RN case manager Tomasa Rand, RN would follow up with him next week and continue to assist him with managing his medical issues. Will update CM accordingly.  Raina Mina, RN Care Management Coordinator [place] Office [phone]

## 2016-11-11 ENCOUNTER — Other Ambulatory Visit: Payer: Self-pay | Admitting: Pharmacist

## 2016-11-11 ENCOUNTER — Ambulatory Visit: Payer: Self-pay | Admitting: Pharmacist

## 2016-11-11 NOTE — Patient Outreach (Signed)
[facility]) Care Management  11/11/2016  Nathan Hall 11-Apr-1935 [id number]   Unsuccessful telephone call to patient today to follow-up on his antiplatelet therapy.  Left HIPAA compliant voicemail.   Will reach out to patient again next week.   Ralene Bathe, PharmD, [place] [phone]

## 2016-11-14 ENCOUNTER — Ambulatory Visit: Payer: Self-pay | Admitting: Pharmacist

## 2016-11-14 ENCOUNTER — Other Ambulatory Visit: Payer: Self-pay | Admitting: Pharmacist

## 2016-11-14 NOTE — Patient Outreach (Signed)
[facility]) Care Management  11/14/2016  Nathan Hall [birth year] [id number]   Successful outreach phone call to patient this morning.  HIPAA identifiers verified. Reviewed medications telephonically and updated medication list.  Patient's cardiologist, Dr. Donnetta Hutching, has changed patient from [place] to [place] due to side effects from [facility].  Patient reports that he feels much better with his new anti-platelet medication and is able to afford it.  He states his cardiologist also stopped the ranolazine and increased the carvedilol dose. He has an ECHO scheduled for tomorrow.  Patient states he still has occasional episodes of neck pain and stiffness which goes away after 30-60 minutes.  He takes a buspirone tablet which he thinks help.  I recommended that he report these symptoms to his provider and to not push himself too hard so soon after his heart procedure.  Patient is looking forward to starting back on cardiac rehab in July.    [facility] pharmacy will close case but am happy to help in the future as needed.    Ralene Bathe, PharmD, [place] [phone]

## 2016-11-15 DIAGNOSIS — R9431 Abnormal electrocardiogram [ECG] [EKG]: Secondary | ICD-10-CM | POA: Diagnosis not present

## 2016-11-18 ENCOUNTER — Other Ambulatory Visit: Payer: Self-pay

## 2016-11-18 NOTE — Patient Outreach (Signed)
Transition of care: Patient returned call and states that [place] is doing well[place]  States no shortness of breath and no swelling.  States that he is slowly getting his energy back.  Denies any new problems or concerns today.  PLAN: Will continue weekly transition of care calls.  Tomasa Rand, RN, BSN, CEN [facility] [employer] [phone]

## 2016-11-24 ENCOUNTER — Other Ambulatory Visit: Payer: Self-pay

## 2016-11-24 NOTE — Patient Outreach (Signed)
Transition of care: Placed call to patient for weekly transition of care. No answer. Left a message requesting a call back.  PLAN: Will await a call back. If no response will attempt again.  Tomasa Rand, RN, BSN, CEN [facility] [employer] [phone]

## 2016-11-25 ENCOUNTER — Other Ambulatory Visit: Payer: Self-pay

## 2016-11-25 NOTE — Patient Outreach (Signed)
Transition of care:  Unsuccessful attempt to reach patient for transition of care.  Left a message requesting a call back.  PLAN: Will continue outreach efforts.  Tomasa Rand, RN, BSN, CEN [facility] [employer] [phone]

## 2016-11-28 DIAGNOSIS — J0100 Acute maxillary sinusitis, unspecified: Secondary | ICD-10-CM | POA: Diagnosis not present

## 2016-11-28 DIAGNOSIS — Z6828 Body mass index (BMI) 28.0-28.9, adult: Secondary | ICD-10-CM | POA: Diagnosis not present

## 2016-11-29 ENCOUNTER — Other Ambulatory Visit: Payer: Self-pay

## 2016-11-29 NOTE — Patient Outreach (Signed)
Transition of care: Voicemail left from patient to call [place] another phone number[place]  Returned call to patient who reports that he is doing well. Reports that he has a sinus infection and has been at the MD office. Reports bad allergies. States today's weight of 181. Denies any swelling or shortness of breath. States no new problems with his medications.   PLAN: reviewed with patient that he has completed his 30 day transition of care program.  Reviewed with patient that I would follow up with him via phone in 30 days. Requested patient to call me sooner if needed. He agreed.   Tomasa Rand, RN, BSN, CEN [facility] [employer] [phone]

## 2016-12-05 DIAGNOSIS — Z6828 Body mass index (BMI) 28.0-28.9, adult: Secondary | ICD-10-CM | POA: Diagnosis not present

## 2016-12-05 DIAGNOSIS — R042 Hemoptysis: Secondary | ICD-10-CM | POA: Diagnosis not present

## 2016-12-26 DIAGNOSIS — I493 Ventricular premature depolarization: Secondary | ICD-10-CM | POA: Diagnosis not present

## 2016-12-26 DIAGNOSIS — E785 Hyperlipidemia, unspecified: Secondary | ICD-10-CM | POA: Diagnosis not present

## 2016-12-26 DIAGNOSIS — I2510 Atherosclerotic heart disease of native coronary artery without angina pectoris: Secondary | ICD-10-CM | POA: Diagnosis not present

## 2016-12-26 DIAGNOSIS — I429 Cardiomyopathy, unspecified: Secondary | ICD-10-CM | POA: Diagnosis not present

## 2016-12-26 DIAGNOSIS — I10 Essential (primary) hypertension: Secondary | ICD-10-CM | POA: Diagnosis not present

## 2016-12-27 ENCOUNTER — Other Ambulatory Visit: Payer: Self-pay

## 2016-12-27 NOTE — Patient Outreach (Signed)
Case closure- 60 day follow up  Vitals:   12/27/16 1136  Weight: 179 lb (81.2 kg)    Placed call to patient who reports that he is doing well. Reports that he is managing his medications without problems.  States is has started cardiac rehab and that it is going well.  Reports no new problems or concerns. Reports recent follow up with cardiology.  PLAN: Reviewed plan of care and that patient has met his goals. Denies any new goals at this time.   Will plan to close case and patient is in agreement to call for any future needs. Will notify MD of case closure. Will notify [facility] care management assistant to close case.   THN CM Care Plan Problem One     Most Recent Value  Care Plan Problem One  At risk for hospital readmission due to recent admission for heart procedure  Role Documenting the Problem One  Care Management [place] for Problem One  Active  [facility] Long Term Goal   Patient will report no readmissions related to heart procedure in the next 60 days.   [facility] Long Term Goal Start Date  10/27/16  [facility] Long Term Goal Met Date  12/27/16  Interventions for Problem One Long Term Goal  Reviewed completion of transiton of care program. Reviewed plan to call patient back in 30 days. reviewed heart failure zones and when to call MD.   [facility] CM Short Term Goal #1   Patient will report decrease in dizziness in the next 14 days.   [facility]N CM Short Term Goal #1 Start Date  10/27/16  [facility] CM Short Term Goal #1 Met Date  11/03/16  Interventions for Short Term Goal #1  Reviewed early recognition of problems and symptoms.  Pharmacy referral placed to review patients concern for medications. Reviewed signs and symptoms of MI and CVA.    [facility] CM Short Term Goal #2   Patient will weigh daily and record in [facility] calendar for the next 30 days.   THN CM Short Term Goal #2 Start Date  10/27/16  [facility] CM Short Term Goal #2 Met Date  11/29/16 Barrie Folk met]  Interventions for Short Term Goal #2  Encouraged patient  to continue to weigh daily.       Tomasa Rand, RN, BSN, CEN [facility] [employer] [phone]

## 2017-01-18 DIAGNOSIS — L821 Other seborrheic keratosis: Secondary | ICD-10-CM | POA: Diagnosis not present

## 2017-01-18 DIAGNOSIS — D485 Neoplasm of uncertain behavior of skin: Secondary | ICD-10-CM | POA: Diagnosis not present

## 2017-01-18 DIAGNOSIS — D1801 Hemangioma of skin and subcutaneous tissue: Secondary | ICD-10-CM | POA: Diagnosis not present

## 2017-01-18 DIAGNOSIS — L570 Actinic keratosis: Secondary | ICD-10-CM | POA: Diagnosis not present

## 2017-01-26 ENCOUNTER — Ambulatory Visit (INDEPENDENT_AMBULATORY_CARE_PROVIDER_SITE_OTHER): Payer: PPO | Admitting: Internal Medicine

## 2017-01-26 ENCOUNTER — Other Ambulatory Visit (INDEPENDENT_AMBULATORY_CARE_PROVIDER_SITE_OTHER): Payer: PPO

## 2017-01-26 ENCOUNTER — Ambulatory Visit (INDEPENDENT_AMBULATORY_CARE_PROVIDER_SITE_OTHER)
Admission: RE | Admit: 2017-01-26 | Discharge: 2017-01-26 | Disposition: A | Discharge status: Home / Self Care | Payer: PPO | Source: Ambulatory Visit | Attending: Internal Medicine | Admitting: Internal Medicine

## 2017-01-26 ENCOUNTER — Encounter: Payer: Self-pay | Admitting: Internal Medicine

## 2017-01-26 VITALS — BP 120/60 | HR 75 | Ht 67.0 in | Wt 183.8 lb

## 2017-01-26 DIAGNOSIS — R05 Cough: Secondary | ICD-10-CM | POA: Diagnosis not present

## 2017-01-26 DIAGNOSIS — R918 Other nonspecific abnormal finding of lung field: Secondary | ICD-10-CM | POA: Diagnosis not present

## 2017-01-26 DIAGNOSIS — J310 Chronic rhinitis: Secondary | ICD-10-CM | POA: Diagnosis not present

## 2017-01-26 HISTORY — DX: Other nonspecific abnormal finding of lung field: R91.8

## 2017-01-26 LAB — CBC WITH DIFFERENTIAL/PLATELET
BASOS PCT: 1.4 % (ref 0.0–3.0)
Basophils Absolute: 0.2 10*3/uL — ABNORMAL HIGH (ref 0.0–0.1)
EOS PCT: 0.6 % (ref 0.0–5.0)
Eosinophils Absolute: 0.1 10*3/uL (ref 0.0–0.7)
HEMATOCRIT: 43.8 % (ref 39.0–52.0)
HEMOGLOBIN: 14.5 g/dL (ref 13.0–17.0)
Lymphocytes Relative: 39.5 % (ref 12.0–46.0)
Lymphs Abs: 4.8 10*3/uL — ABNORMAL HIGH (ref 0.7–4.0)
MCHC: 33.1 g/dL (ref 30.0–36.0)
MCV: 95.7 fl (ref 78.0–100.0)
MONOS PCT: 8.6 % (ref 3.0–12.0)
Monocytes Absolute: 1.1 10*3/uL — ABNORMAL HIGH (ref 0.1–1.0)
Neutro Abs: 6.1 10*3/uL (ref 1.4–7.7)
Neutrophils Relative %: 49.9 % (ref 43.0–77.0)
Platelets: 219 10*3/uL (ref 150.0–400.0)
RBC: 4.58 Mil/uL (ref 4.22–5.81)
RDW: 14 % (ref 11.5–15.5)
WBC: 12.2 10*3/uL — AB (ref 4.0–10.5)

## 2017-01-26 LAB — SEDIMENTATION RATE: Sed Rate: 4 mm/hr (ref 0–20)

## 2017-01-26 NOTE — Progress Notes (Signed)
Spoke with pt and notified of results per Dr. Wert. Pt verbalized understanding and denied any questions. 

## 2017-01-26 NOTE — Progress Notes (Signed)
Subjective:     Patient ID: Nathan Hall, male   DOB: 13-Oct-1934,     MRN: [id number]  HPI  43 yowm never smoker  With IHD s/p MI around 2000 and participating in rehab sev times a week and doing fine until May 2018 onset of doe > dx as IHD and 100% back to baseline and doing rehab then "typical cough" assoc nose getting clogged up that  usually comes on June/july yearly "forever" and attributes to allergy but improves with abx "or I end up with Pna"  - this year abrupt onset late June early July 2018 with nasal congestion / cough > initially yellow then slt bloody failed first abx then took levaquin and 100% back to baseline since   last CT scan was done 12/05/16 suggesting LLL as dz  referred to pulmonary clinic 01/26/2017 by Dr   Venetia Maxon    01/26/2017 1st [facility] office visit/ Amitai Delaughter   Chief Complaint  Patient presents with  . Pulmonary Consult    Referred by Dr. Venetia Maxon. Pt states "I had an allergy"- caused him to cough and produced some dark red sputum for 3 consecutive days in July 2018.   says 100% back to baseline p treated for "allergies" and extremely vague and difficult hx as pt doesn't easily focus on symptoms but rather diagnoses rendered by previous md's but at this point I could not detect any ongoing sympotms at all and back doing the same level rehab ex he was prior to his "allergies"  Note amt of hemoptysis was very small, only lasted a few days, and occurred while on asa and effient which he has continued s recurrence   No obvious day to day or daytime variability or assoc excess/ purulent sputum or mucus plugs or hemoptysis or cp or chest tightness, subjective wheeze or overt sinus or hb symptoms. No unusual exp hx or h/o childhood pna/ asthma or knowledge of premature birth.  Sleeping ok without nocturnal  or early am exacerbation  of respiratory  c/o's or need for noct saba. Also denies any obvious fluctuation of symptoms with weather or environmental changes or other  aggravating or alleviating factors except as outlined above   Current Medications, Allergies, Complete Past Medical History, Past Surgical History, Family History, and Social History were reviewed in [employer] record.  ROS  The following are not active complaints unless bolded sore throat, dysphagia, dental problems, itching, sneezing,  nasal congestion or excess/ purulent secretions, ear ache,   fever, chills, sweats, unintended wt loss, classically pleuritic or exertional cp,  orthopnea pnd or leg swelling, presyncope, palpitations, abdominal pain, anorexia, nausea, vomiting, diarrhea  or change in bowel or bladder habits, change in stools or urine, dysuria,hematuria,  rash, arthralgias, visual complaints, headache, numbness, weakness or ataxia or problems with walking or coordination,  change in mood/affect or memory.         Review of Systems     Objective:   Physical Exam     amb wm slt nasal tone to voice  Wt Readings from Last 3 Encounters:  01/26/17 183 lb 12.8 oz (83.4 kg)  12/27/16 179 lb (81.2 kg)  11/29/16 181 lb (82.1 kg)    Vital signs reviewed  - Note on arrival 02 sats  96% on RA     HEENT: nl dentition, turbinates bilaterally, and oropharynx. Nl external ear canals without cough reflex   NECK :  without JVD/Nodes/TM/ nl carotid upstrokes bilaterally   LUNGS: no  acc muscle use,  Nl contour chest which is clear to A and P bilaterally without cough on insp or exp maneuvers   CV:  RRR  no s3 or murmur or increase in P2, and  Trace bilateral pitting sym lower ext edema   ABD:  soft and nontender with nl inspiratory excursion in the supine position. No bruits or organomegaly appreciated, bowel sounds nl  MS:  Nl gait/ ext warm without deformities, calf tenderness, cyanosis or clubbing No obvious joint restrictions   SKIN: warm and dry with ecchymoses both forearms   NEURO:  alert, approp, nl sensorium with  no motor or cerebellar  deficits apparent.    CXR PA and Lateral:   01/26/2017 :    I personally reviewed images and agree with radiology impression as follows:    The heart size and mediastinal contours are within normal limits. No pneumothorax or pleural effusion is noted. Stable eventration of right hemidiaphragm is noted. Mild bibasilar subsegmental atelectasis or scarring is noted. The visualized skeletal structures are unremarkable. My impression:  Eventration is on the Left  and the area of concern seen on lateral previously is not viz though there is still evidence of some sreaky atx bilaterally in bases     Labs ordered 01/26/2017   Allergy profile   Lab Results  Component Value Date   WBC 12.2 (H) 01/26/2017   HGB 14.5 01/26/2017   HCT 43.8 01/26/2017   MCV 95.7 01/26/2017   PLT 219.0 01/26/2017       EOS                       0.1                                                                   01/26/2017     Lab Results  Component Value Date   ESRSEDRATE 4 01/26/2017       Assessment:

## 2017-01-26 NOTE — Patient Instructions (Signed)
Please remember to go to the lab and x-ray department downstairs in the basement  for your tests - we will call you with the results when they are available.     Call if worse breathing/ coughing  Or pain with breathing

## 2017-01-27 DIAGNOSIS — J310 Chronic rhinitis: Secondary | ICD-10-CM | POA: Insufficient documentation

## 2017-01-27 HISTORY — DX: Chronic rhinitis: J31.0

## 2017-01-27 LAB — RESPIRATORY ALLERGY PROFILE REGION II ~~LOC~~
Allergen, A. alternata, m6: <0.1 kU/L
Allergen, Cedar tree, t12: <0.1 kU/L
Allergen, D pternoyssinus,d7: 0.43 kU/L — ABNORMAL HIGH
Allergen, Mouse Urine Protein, e78: <0.1 kU/L
Allergen, Oak,t7: <0.1 kU/L
Allergen, P. notatum, m1: <0.1 kU/L
Aspergillus fumigatus, m3: <0.1 kU/L
Box Elder IgE: <0.1 kU/L
Cat Dander: <0.1 kU/L
Common Ragweed: <0.1 kU/L
D. FARINAE: 0.46 kU/L — AB
IgE (Immunoglobulin E), Serum: 53 kU/L (ref <115)
Rough Pigweed  IgE: <0.1 kU/L
Sheep Sorrel IgE: <0.1 kU/L

## 2017-01-27 NOTE — Assessment & Plan Note (Addendum)
Onset symptoms was late June 2018 and CT scan 12/05/16 c/w as dz LLL/ clear on f/u 01/26/2017   He is a never smoker with apparent chronic changes on cxr c/w eventration on L with air space dz/ hemptysis that cleared in a few days p rx for presumed CAP and no residual acute changes on cxr so no need for directed f/u at this point/ confirmed with Dr Nyoka Cowden who read the cxr (mislabeled as R eventration when it is on the L)  His h/o "allergies" every summer suggest perhaps rhinitis/sinusitis as the initial recurrent sinus dz as the primary problem so see chronic rhinitis a/p  Total time devoted to counseling  > 50 % of initial 45 min office visit:  review case with pt/ discussion of options/alternatives/ personally creating written customized instructions  in presence of pt  then going over those specific  Instructions directly with the pt including how to use all of the meds but in particular covering each new medication in detail and the difference between the maintenance= "automatic" meds and the prns using an action plan format for the latter (If this problem/symptom => do that organization reading Left to right).  Please see AVS from this visit for a full list of these instructions which I personally wrote for this pt and  are unique to this visit.

## 2017-01-27 NOTE — Assessment & Plan Note (Signed)
Allergy profile 01/26/2017 >  Eos 0.1 /  IgE pending   May be able to rx seasonally for rhinitis and pre-empt his recurrent "allergies" in future from "pna every time" and would consider sinus ct as well if this pattern which pt says "is forever" continues

## 2017-01-31 NOTE — Progress Notes (Signed)
Spoke with pt and notified of results per Dr. Wert. Pt verbalized understanding and denied any questions. 

## 2017-03-29 DIAGNOSIS — Z23 Encounter for immunization: Secondary | ICD-10-CM | POA: Diagnosis not present

## 2017-04-11 DIAGNOSIS — I519 Heart disease, unspecified: Secondary | ICD-10-CM

## 2017-04-11 DIAGNOSIS — I2510 Atherosclerotic heart disease of native coronary artery without angina pectoris: Secondary | ICD-10-CM | POA: Diagnosis not present

## 2017-04-11 DIAGNOSIS — I110 Hypertensive heart disease with heart failure: Secondary | ICD-10-CM | POA: Diagnosis not present

## 2017-04-11 DIAGNOSIS — I493 Ventricular premature depolarization: Secondary | ICD-10-CM | POA: Diagnosis not present

## 2017-04-11 HISTORY — DX: Heart disease, unspecified: I51.9

## 2017-05-09 DIAGNOSIS — H524 Presbyopia: Secondary | ICD-10-CM | POA: Diagnosis not present

## 2017-05-09 DIAGNOSIS — H31012 Macula scars of posterior pole (postinflammatory) (post-traumatic), left eye: Secondary | ICD-10-CM | POA: Diagnosis not present

## 2017-05-09 DIAGNOSIS — H353121 Nonexudative age-related macular degeneration, left eye, early dry stage: Secondary | ICD-10-CM | POA: Diagnosis not present

## 2017-08-10 DIAGNOSIS — E785 Hyperlipidemia, unspecified: Secondary | ICD-10-CM | POA: Diagnosis not present

## 2017-08-10 DIAGNOSIS — I493 Ventricular premature depolarization: Secondary | ICD-10-CM | POA: Diagnosis not present

## 2017-08-10 DIAGNOSIS — I119 Hypertensive heart disease without heart failure: Secondary | ICD-10-CM | POA: Diagnosis not present

## 2017-08-10 DIAGNOSIS — I2510 Atherosclerotic heart disease of native coronary artery without angina pectoris: Secondary | ICD-10-CM | POA: Diagnosis not present

## 2017-08-10 DIAGNOSIS — I519 Heart disease, unspecified: Secondary | ICD-10-CM | POA: Diagnosis not present

## 2017-10-09 DIAGNOSIS — I493 Ventricular premature depolarization: Secondary | ICD-10-CM | POA: Diagnosis not present

## 2017-10-09 DIAGNOSIS — I2510 Atherosclerotic heart disease of native coronary artery without angina pectoris: Secondary | ICD-10-CM | POA: Diagnosis not present

## 2017-10-09 DIAGNOSIS — I519 Heart disease, unspecified: Secondary | ICD-10-CM | POA: Diagnosis not present

## 2017-10-31 DIAGNOSIS — H353121 Nonexudative age-related macular degeneration, left eye, early dry stage: Secondary | ICD-10-CM | POA: Diagnosis not present

## 2017-12-04 DIAGNOSIS — Z79899 Other long term (current) drug therapy: Secondary | ICD-10-CM | POA: Diagnosis not present

## 2017-12-04 DIAGNOSIS — E782 Mixed hyperlipidemia: Secondary | ICD-10-CM | POA: Diagnosis not present

## 2017-12-06 DIAGNOSIS — Z0000 Encounter for general adult medical examination without abnormal findings: Secondary | ICD-10-CM | POA: Diagnosis not present

## 2017-12-06 DIAGNOSIS — Z6827 Body mass index (BMI) 27.0-27.9, adult: Secondary | ICD-10-CM | POA: Diagnosis not present

## 2017-12-06 DIAGNOSIS — Z1331 Encounter for screening for depression: Secondary | ICD-10-CM | POA: Diagnosis not present

## 2017-12-06 DIAGNOSIS — M47812 Spondylosis without myelopathy or radiculopathy, cervical region: Secondary | ICD-10-CM | POA: Diagnosis not present

## 2017-12-06 DIAGNOSIS — I420 Dilated cardiomyopathy: Secondary | ICD-10-CM | POA: Diagnosis not present

## 2017-12-06 DIAGNOSIS — M791 Myalgia, unspecified site: Secondary | ICD-10-CM | POA: Diagnosis not present

## 2017-12-06 DIAGNOSIS — Z1339 Encounter for screening examination for other mental health and behavioral disorders: Secondary | ICD-10-CM | POA: Diagnosis not present

## 2017-12-12 DIAGNOSIS — I493 Ventricular premature depolarization: Secondary | ICD-10-CM | POA: Diagnosis not present

## 2017-12-12 DIAGNOSIS — I420 Dilated cardiomyopathy: Secondary | ICD-10-CM | POA: Diagnosis not present

## 2017-12-12 DIAGNOSIS — I2510 Atherosclerotic heart disease of native coronary artery without angina pectoris: Secondary | ICD-10-CM | POA: Diagnosis not present

## 2017-12-12 DIAGNOSIS — I445 Left posterior fascicular block: Secondary | ICD-10-CM | POA: Diagnosis not present

## 2017-12-12 DIAGNOSIS — I119 Hypertensive heart disease without heart failure: Secondary | ICD-10-CM | POA: Diagnosis not present

## 2017-12-12 DIAGNOSIS — E785 Hyperlipidemia, unspecified: Secondary | ICD-10-CM | POA: Diagnosis not present

## 2017-12-12 DIAGNOSIS — I519 Heart disease, unspecified: Secondary | ICD-10-CM | POA: Diagnosis not present

## 2017-12-12 DIAGNOSIS — R002 Palpitations: Secondary | ICD-10-CM | POA: Diagnosis not present

## 2017-12-27 DIAGNOSIS — I499 Cardiac arrhythmia, unspecified: Secondary | ICD-10-CM | POA: Diagnosis not present

## 2017-12-27 DIAGNOSIS — M791 Myalgia, unspecified site: Secondary | ICD-10-CM | POA: Diagnosis not present

## 2017-12-27 DIAGNOSIS — M47812 Spondylosis without myelopathy or radiculopathy, cervical region: Secondary | ICD-10-CM | POA: Diagnosis not present

## 2018-01-01 DIAGNOSIS — I499 Cardiac arrhythmia, unspecified: Secondary | ICD-10-CM | POA: Diagnosis not present

## 2018-01-08 DIAGNOSIS — M791 Myalgia, unspecified site: Secondary | ICD-10-CM | POA: Diagnosis not present

## 2018-01-18 DIAGNOSIS — I499 Cardiac arrhythmia, unspecified: Secondary | ICD-10-CM | POA: Diagnosis not present

## 2018-01-22 DIAGNOSIS — G720 Drug-induced myopathy: Secondary | ICD-10-CM | POA: Diagnosis not present

## 2018-01-22 DIAGNOSIS — Z23 Encounter for immunization: Secondary | ICD-10-CM | POA: Diagnosis not present

## 2018-01-22 DIAGNOSIS — Z6826 Body mass index (BMI) 26.0-26.9, adult: Secondary | ICD-10-CM | POA: Diagnosis not present

## 2018-01-22 DIAGNOSIS — Z9181 History of falling: Secondary | ICD-10-CM | POA: Diagnosis not present

## 2018-01-22 DIAGNOSIS — I472 Ventricular tachycardia: Secondary | ICD-10-CM | POA: Diagnosis not present

## 2018-01-23 DIAGNOSIS — R233 Spontaneous ecchymoses: Secondary | ICD-10-CM | POA: Diagnosis not present

## 2018-01-23 DIAGNOSIS — L821 Other seborrheic keratosis: Secondary | ICD-10-CM | POA: Diagnosis not present

## 2018-01-23 DIAGNOSIS — C44319 Basal cell carcinoma of skin of other parts of face: Secondary | ICD-10-CM | POA: Diagnosis not present

## 2018-01-23 DIAGNOSIS — L578 Other skin changes due to chronic exposure to nonionizing radiation: Secondary | ICD-10-CM | POA: Diagnosis not present

## 2018-01-23 DIAGNOSIS — L570 Actinic keratosis: Secondary | ICD-10-CM | POA: Diagnosis not present

## 2018-01-24 ENCOUNTER — Encounter: Payer: Self-pay | Admitting: Cardiology

## 2018-01-25 ENCOUNTER — Encounter: Payer: Self-pay | Admitting: Cardiology

## 2018-01-25 ENCOUNTER — Ambulatory Visit: Payer: PPO | Admitting: Cardiology

## 2018-01-25 VITALS — BP 124/70 | HR 67 | Ht 67.0 in | Wt 177.4 lb

## 2018-01-25 DIAGNOSIS — I2510 Atherosclerotic heart disease of native coronary artery without angina pectoris: Secondary | ICD-10-CM | POA: Diagnosis not present

## 2018-01-25 DIAGNOSIS — I10 Essential (primary) hypertension: Secondary | ICD-10-CM | POA: Diagnosis not present

## 2018-01-25 DIAGNOSIS — I493 Ventricular premature depolarization: Secondary | ICD-10-CM

## 2018-01-25 DIAGNOSIS — I5022 Chronic systolic (congestive) heart failure: Secondary | ICD-10-CM

## 2018-01-25 MED ORDER — MEXILETINE HCL 150 MG PO CAPS: 150.0000 mg | ORAL_CAPSULE | Freq: Two times a day (BID) | ORAL | 3 refills | Status: DC

## 2018-01-25 NOTE — Progress Notes (Signed)
Electrophysiology Office Note   Date:  01/25/2018   ID:  Nathan Hall, DOB 1934/07/08, MRN [id number]  PCP:  Myer Peer, MD  Cardiologist:  Andrey Spearman Primary Electrophysiologist:  Erol Flanagin Meredith Leeds, MD    No chief complaint on file.    History of Present Illness: Nathan Hall is a 83 y.o. male who is being seen today for the evaluation of PVCs at the request of [name]. Presenting today for electrophysiology evaluation.  He has a history of CHF, CLL, coronary artery disease status post circumflex and LAD stents, hypertension, and hyperlipidemia.  He also has palpitations and was noted to have frequent PVCs.  He was found to be fatigued and thus his carvedilol was decreased.  Echo shows some improvement in LV function.  His main complaint today is of palpitations, weakness, and fatigue.  He brings in heart rate and blood pressure results and his heart rate gets into the 40s at times.  He feels these occasionally.  He wore a cardiac monitor that showed episodic ventricular bigeminy with some short ventricular runs.  He stopped his losartan recently due to hypotension and weakness as well.  He had continued to have issues with his palpitations.  Today, he denies symptoms of palpitations, chest pain, shortness of breath, orthopnea, PND, lower extremity edema, claudication, dizziness, presyncope, syncope, bleeding, or neurologic sequela. [name].    Past Medical History:  Diagnosis Date  . Cancer ([place])   . Cataract    were removed  . CHF (congestive heart failure) ([place])   . CLL (chronic lymphocytic leukemia) ([place])   . Coronary artery disease   . GERD (gastroesophageal reflux disease)   . Hyperlipidemia   . Hypertension    Past Surgical History:  Procedure Laterality Date  . APPENDECTOMY    . cardiac stents    . EYE SURGERY    . HERNIA REPAIR       Current Outpatient Medications  Medication Sig  Dispense Refill  . aspirin EC 81 MG tablet Take 81 mg by mouth daily.    Marland Kitchen atorvastatin (LIPITOR) 80 MG tablet Take 80 mg by mouth daily.    . busPIRone (BUSPAR) 5 MG tablet Take 5 mg by mouth 3 (three) times daily as needed.    . carvedilol (COREG) 12.5 MG tablet Take 12.5 mg by mouth 2 (two) times daily with a meal.    . Cholecalciferol (VITAMIN D3) 2000 units capsule Take 1 capsule by mouth daily.    . clopidogrel (PLAVIX) 75 MG tablet Take 1 tablet by mouth daily.    . CO ENZYME Q-10 PO Take 200 mg by mouth daily.    . furosemide (LASIX) 40 MG tablet Take 40 mg by mouth daily as needed for edema.    . multivitamin-lutein (OCUVITE-LUTEIN) CAPS capsule Take 1 capsule by mouth daily.    . nitroGLYCERIN (NITROSTAT) 0.4 MG SL tablet Place 1 tablet under the tongue as needed for chest pain.    . pantoprazole (PROTONIX) 40 MG tablet Take 40 mg by mouth daily.    . potassium chloride SA (K-DUR,KLOR-CON) 20 MEQ tablet Take 20 mEq by mouth daily.    . prasugrel (EFFIENT) 10 MG TABS tablet Take 10 mg by mouth daily.    . ranitidine (ZANTAC) 150 MG capsule Take 150 mg by mouth daily.    Marland Kitchen spironolactone (ALDACTONE) 25 MG tablet Take 12.5 mg by mouth daily.     No current  facility-administered medications for this visit.     Allergies:   Lisinopril; Niaspan [niacin er]; and Simvastatin   Social History:  The patient  reports that he has never smoked. He has never used smokeless tobacco. He reports that he does not drink alcohol or use drugs.   Family History:  The patient's family history includes Alzheimer's disease in his maternal grandfather; Cancer in his paternal grandmother; Congestive Heart Failure in his father; Heart Problems in his sister; Heart attack in his maternal grandmother; Pneumonia in his mother; Stomach cancer in his father.    ROS:  Please see the history of present illness.   Otherwise, review of systems is positive for patient's, anxiety.   All other systems are reviewed and  negative.    PHYSICAL EXAM: VS:  BP 124/70   Pulse 67   Ht 5\' 7"  (1.702 m)   Wt 177 lb 6.4 oz (80.5 kg)   SpO2 96%   BMI 27.78 kg/m  , BMI Body mass index is 27.78 kg/m. GEN: Well nourished, well developed, in no acute distress  HEENT: normal  Neck: no JVD, carotid bruits, or masses Cardiac: RRR; no murmurs, rubs, or gallops,no edema  Respiratory:  clear to auscultation bilaterally, normal work of breathing GI: soft, nontender, nondistended, + BS MS: no deformity or atrophy  Skin: warm and dry Neuro:  Strength and sensation are intact Psych: euthymic mood, full affect  EKG:  EKG is ordered today. Personal review of the ekg ordered shows this rhythm, rate 67, lateral T wave inversions   Recent Labs: 01/26/2017: Hemoglobin 14.5; Platelets 219.0    Lipid Panel  No results found for: CHOL, TRIG, HDL, CHOLHDL, VLDL, LDLCALC, LDLDIRECT   Wt Readings from Last 3 Encounters:  01/25/18 177 lb 6.4 oz (80.5 kg)  01/26/17 183 lb 12.8 oz (83.4 kg)  12/27/16 179 lb (81.2 kg)      Other studies Reviewed: Additional studies/ records that were reviewed today include: TTE  10/09/17 Review of the above records today demonstrates:  Normal left ventricular systolic function Ejection fraction is visually estimated at 55% Moderately dilated left atrium. Mild mitral, aortic and tricuspid regurgitation. RVSP 51 mm Hg.   ASSESSMENT AND PLAN:  1.  PVCs: Has seen bigeminy on EKGs.  Holter monitor shows episodes of ventricular bigeminy with symptoms of weakness and fatigue.  Due to that, start him on mexiletine 150 mg twice a day.  2.  Congestive heart failure: Ejection fraction has improved to normal on most recent echo.  No changes.  3.  Coronary artery disease: Status post circumflex and LAD stenting.  No current chest pain  4.  Hypertension: Well-controlled today    Current medicines are reviewed at length with the patient today.   The patient does not have concerns regarding  his medicines.  The following changes were made today:  none  Labs/ tests ordered today include:  Orders Placed This Encounter  Procedures  . EKG 12-Lead     Disposition:   FU with Wandalene Abrams 3 months  Signed, Daison Braxton Meredith Leeds, MD  01/25/2018 10:13 AM     [facility] [address] [place] [address] [phone] (office) [phone] (fax)

## 2018-01-25 NOTE — Patient Instructions (Addendum)
Medication Instructions:  Your physician has recommended you make the following change in your medication:  1. START Mexiletine 150 mg twice daily  * If you need a refill on your cardiac medications before your next appointment, please call your pharmacy.   Labwork: None ordered  Testing/Procedures: None ordered  Follow-Up: Your physician recommends that you schedule a follow-up appointment in: 3 months with Dr. Curt Bears in [place]  *Please note that any paperwork needing to be filled out by the provider will need to be addressed at the front desk prior to seeing the provider. Please note that any FMLA, disability or other documents regarding health condition is subject to a $25.00 charge that must be received prior to completion of paperwork in the form of a money order or check.  Thank you for choosing [facility]!!   Trinidad Curet, RN [phone]  Any Other Special Instructions Will Be Listed Below (If Applicable).       Mexiletine capsules What is this medicine? MEXILETINE (mex [place] e teen) is an antiarrhythmic agent. This medicine is used to treat irregular heart rhythm and can slow rapid heartbeats. It can help your heart to return to and maintain a normal rhythm. Because of the side effects caused by this medicine, it is usually used for heartbeat problems that may be life-threatening. This medicine may be used for other purposes; ask your health care provider or pharmacist if you have questions. COMMON BRAND NAME(S): Mexitil What should I tell my health care provider before I take this medicine? They need to know if you have any of these conditions: -liver disease -other heart problems -previous heart attack -an unusual or allergic reaction to mexiletine, other medicines, foods, dyes, or preservatives -pregnant or trying to get pregnant -breast-feeding How should I use this medicine? Take this medicine by mouth with a glass of water. Follow the directions on the  prescription label. It is recommended that you take this medicine with food or an antacid. Take your doses at regular intervals. Do not take your medicine more often than directed. Do not stop taking except on the advice of your doctor or health care professional. Talk to your pediatrician regarding the use of this medicine in children. Special care may be needed. Overdosage: If you think you have taken too much of this medicine contact a poison control center or emergency room at once. NOTE: This medicine is only for you. Do not share this medicine with others. What if I miss a dose? If you miss a dose, take it as soon as you can. If it is almost time for your next dose, take only that dose. Do not take double or extra doses. What may interact with this medicine? Do not take this medicine with any of the following medications: -dofetilide This medicine may also interact with the following medications: -caffeine -cimetidine -medicines for depression, anxiety, or psychotic disturbances -medicines to control heart rhythm -phenobarbital -phenytoin -rifampin -theophylline This list may not describe all possible interactions. Give your health care provider a list of all the medicines, herbs, non-prescription drugs, or dietary supplements you use. Also tell them if you smoke, drink alcohol, or use illegal drugs. Some items may interact with your medicine. What should I watch for while using this medicine? Your condition will be monitored closely when you first begin therapy. Often, this drug is first started in a hospital or other monitored health care setting. Once you are on maintenance therapy, visit your doctor or health care professional for  regular checks on your progress. Because your condition and use of this medicine carry some risk, it is a good idea to carry an identification card, necklace or bracelet with details of your condition, medications, and doctor or health care professional. Dennis Bast  may get drowsy or dizzy. Do not drive, use machinery, or do anything that needs mental alertness until you know how this medicine affects you. Do not stand or sit up quickly, especially if you are an older patient. This reduces the risk of dizzy or fainting spells. Alcohol can make you more dizzy, increase flushing and rapid heartbeats. Avoid alcoholic drinks. What side effects may I notice from receiving this medicine? Side effects that you should report to your doctor or health care professional as soon as possible: -allergic reactions like skin rash, itching or hives, swelling of the face, lips, or tongue -breathing problems -chest pain, continued irregular heartbeats -redness, blistering, peeling or loosening of the skin, including inside the mouth -seizures -skin rash -trembling, shaking -unusual bleeding or bruising -unusually weak or tired Side effects that usually do not require medical attention (report to your doctor or health care professional if they continue or are bothersome): -blurred vision -difficulty walking -heartburn -nausea, vomiting -nervousness -numbness, or tingling in the fingers or toes This list may not describe all possible side effects. Call your doctor for medical advice about side effects. You may report side effects to FDA at 1-800-FDA-1088. Where should I keep my medicine? Keep out of reach of children. Store at room temperature between 15 and 30 degrees C (59 and 86 degrees F). Throw away any unused medicine after the expiration date. NOTE: This sheet is a summary. It may not cover all possible information. If you have questions about this medicine, talk to your doctor, pharmacist, or health care provider.  2018 Elsevier/Gold Standard (2007-12-03 13:59:49)

## 2018-02-05 DIAGNOSIS — I472 Ventricular tachycardia: Secondary | ICD-10-CM | POA: Diagnosis not present

## 2018-02-05 DIAGNOSIS — Z6826 Body mass index (BMI) 26.0-26.9, adult: Secondary | ICD-10-CM | POA: Diagnosis not present

## 2018-02-05 DIAGNOSIS — I493 Ventricular premature depolarization: Secondary | ICD-10-CM | POA: Diagnosis not present

## 2018-02-05 DIAGNOSIS — F419 Anxiety disorder, unspecified: Secondary | ICD-10-CM | POA: Diagnosis not present

## 2018-02-06 DIAGNOSIS — Z23 Encounter for immunization: Secondary | ICD-10-CM | POA: Diagnosis not present

## 2018-02-12 ENCOUNTER — Telehealth: Payer: Self-pay | Admitting: Cardiology

## 2018-02-12 NOTE — Telephone Encounter (Signed)
Reviewed w/ Dr. Curt Bears. Pt advised his c/o is not r/t Mexiletine. Pt will follow up w/ PCP. He appreciates the call.

## 2018-02-12 NOTE — Telephone Encounter (Signed)
New message   Pt c/o medication issue:  1. Name of Medication:mexiletine (MEXITIL) 150 MG capsule  2. How are you currently taking this medication (dosage and times per day)? Twice daily in morning and at night  3. Are you having a reaction (difficulty breathing--STAT)?no   4. What is your medication issue?patient states that he is congested in throat. He wants to know if this a side effect of the medication?

## 2018-02-13 DIAGNOSIS — Z6825 Body mass index (BMI) 25.0-25.9, adult: Secondary | ICD-10-CM | POA: Diagnosis not present

## 2018-02-13 DIAGNOSIS — J329 Chronic sinusitis, unspecified: Secondary | ICD-10-CM | POA: Diagnosis not present

## 2018-02-15 DIAGNOSIS — L600 Ingrowing nail: Secondary | ICD-10-CM

## 2018-02-15 HISTORY — DX: Ingrowing nail: L60.0

## 2018-02-21 DIAGNOSIS — C441121 Basal cell carcinoma of skin of right upper eyelid, including canthus: Secondary | ICD-10-CM | POA: Diagnosis not present

## 2018-02-22 DIAGNOSIS — Z4889 Encounter for other specified surgical aftercare: Secondary | ICD-10-CM | POA: Insufficient documentation

## 2018-02-22 HISTORY — DX: Encounter for other specified surgical aftercare: Z48.89

## 2018-02-26 DIAGNOSIS — Z6825 Body mass index (BMI) 25.0-25.9, adult: Secondary | ICD-10-CM | POA: Diagnosis not present

## 2018-02-26 DIAGNOSIS — J302 Other seasonal allergic rhinitis: Secondary | ICD-10-CM | POA: Diagnosis not present

## 2018-02-26 DIAGNOSIS — I420 Dilated cardiomyopathy: Secondary | ICD-10-CM | POA: Diagnosis not present

## 2018-03-29 ENCOUNTER — Encounter

## 2018-03-29 ENCOUNTER — Encounter: Payer: Self-pay | Admitting: Cardiology

## 2018-03-29 ENCOUNTER — Ambulatory Visit (INDEPENDENT_AMBULATORY_CARE_PROVIDER_SITE_OTHER): Payer: PPO | Admitting: Cardiology

## 2018-03-29 VITALS — BP 142/60 | HR 76 | Ht 67.0 in | Wt 163.8 lb

## 2018-03-29 DIAGNOSIS — I493 Ventricular premature depolarization: Secondary | ICD-10-CM | POA: Diagnosis not present

## 2018-03-29 DIAGNOSIS — I2510 Atherosclerotic heart disease of native coronary artery without angina pectoris: Secondary | ICD-10-CM

## 2018-03-29 DIAGNOSIS — I519 Heart disease, unspecified: Secondary | ICD-10-CM | POA: Diagnosis not present

## 2018-03-29 DIAGNOSIS — I10 Essential (primary) hypertension: Secondary | ICD-10-CM

## 2018-03-29 DIAGNOSIS — E785 Hyperlipidemia, unspecified: Secondary | ICD-10-CM | POA: Diagnosis not present

## 2018-03-29 NOTE — Progress Notes (Signed)
Cardiology Office Note:    Date:  03/29/2018   ID:  Nathan Hall, DOB [birth year], MRN [id number]  PCP:  Myer Peer, MD  Cardiologist:  Jenne Campus, MD    Referring MD: Myer Peer, MD   Chief Complaint  Patient presents with  . Follow-up  Doing better  History of Present Illness:    Nathan Hall is a 83 y.o. male with coronary artery disease, cardiomyopathy, frequent ventricular ectopy.  He was referred to EP team for frequent ventricle ectopy he was put on mexiletine and doing very well he said it helps a great deal he does not feel any palpitations there is no dizziness no passing out.  He still very active he goes to cardiac rehab few times a week and enjoyed no chest pain tightness squeezing pressure burning chest.  He does have CLL and he is concerned about the fact that he lost some weight since mexiletine has been started.  For some reason his cholesterol medication has been withdrawn by primary care physician we will try to investigate what it happened I will ask him to have fasting lipid profile as well as liver function test checked today he also asked me to check his CBC which I will do.  Past Medical History:  Diagnosis Date  . Cancer ([place])   . Cataract    were removed  . CHF (congestive heart failure) ([place])   . CLL (chronic lymphocytic leukemia) ([place])   . Coronary artery disease   . GERD (gastroesophageal reflux disease)   . Hyperlipidemia   . Hypertension     Past Surgical History:  Procedure Laterality Date  . APPENDECTOMY    . cardiac stents    . EYE SURGERY    . HERNIA REPAIR      Current Medications: Current Meds  Medication Sig  . aspirin EC 81 MG tablet Take 81 mg by mouth daily.  . busPIRone (BUSPAR) 5 MG tablet Take 5 mg by mouth 3 (three) times daily as needed.  . carvedilol (COREG) 12.5 MG tablet Take 12.5 mg by mouth 2 (two) times daily with a meal.  . Cholecalciferol (VITAMIN D3) 2000 units capsule Take 1 capsule by mouth  daily.  . CO ENZYME Q-10 PO Take 200 mg by mouth daily.  . furosemide (LASIX) 40 MG tablet Take 40 mg by mouth daily as needed for edema.  . mexiletine (MEXITIL) 150 MG capsule Take 1 capsule (150 mg total) by mouth 2 (two) times daily.  . multivitamin-lutein (OCUVITE-LUTEIN) CAPS capsule Take 1 capsule by mouth daily.  . nitroGLYCERIN (NITROSTAT) 0.4 MG SL tablet Place 1 tablet under the tongue as needed for chest pain.  . pantoprazole (PROTONIX) 40 MG tablet Take 40 mg by mouth daily.  . potassium chloride SA (K-DUR,KLOR-CON) 20 MEQ tablet Take 20 mEq by mouth daily.  . ranitidine (ZANTAC) 150 MG capsule Take 150 mg by mouth daily.  Marland Kitchen spironolactone (ALDACTONE) 25 MG tablet Take 12.5 mg by mouth daily.     Allergies:   Lisinopril; Niaspan [niacin er]; and Simvastatin   Social History   Socioeconomic History  . Marital status: Married    Spouse name: Not on file  . Number of children: Not on file  . Years of education: Not on file  . Highest education level: Not on file  Occupational History  . Not on file  Social Needs  . Financial resource strain: Not on file  . Food insecurity:    Worry:  Not on file    Inability: Not on file  . Transportation needs:    Medical: Not on file    Non-medical: Not on file  Tobacco Use  . Smoking status: Never Smoker  . Smokeless tobacco: Never Used  . Tobacco comment: States years ago/did not inhale/brief period/can't remeber dates  Substance and Sexual Activity  . Alcohol use: No  . Drug use: No  . Sexual activity: Not on file  Lifestyle  . Physical activity:    Days per week: Not on file    Minutes per session: Not on file  . Stress: Not on file  Relationships  . Social connections:    Talks on phone: Not on file    Gets together: Not on file    Attends religious service: Not on file    Active member of club or organization: Not on file    Attends meetings of clubs or organizations: Not on file    Relationship status: Not on file    Other Topics Concern  . Not on file  Social History Narrative  . Not on file     Family History: The patient's family history includes Alzheimer's disease in his maternal grandfather; Cancer in his paternal grandmother; Congestive Heart Failure in his father; Heart Problems in his sister; Heart attack in his maternal grandmother; Pneumonia in his mother; Stomach cancer in his father. ROS:   Please see the history of present illness.    All 14 point review of systems negative except as described per history of present illness  EKGs/Labs/Other Studies Reviewed:      Recent Labs: No results found for requested labs within last 8760 hours.  Recent Lipid Panel No results found for: CHOL, TRIG, HDL, CHOLHDL, VLDL, LDLCALC, LDLDIRECT  Physical Exam:    VS:  BP (!) 142/60   Pulse 76   Ht 5\' 7"  (1.702 m)   Wt 163 lb 12.8 oz (74.3 kg)   SpO2 98%   BMI 25.65 kg/m     Wt Readings from Last 3 Encounters:  03/29/18 163 lb 12.8 oz (74.3 kg)  01/25/18 177 lb 6.4 oz (80.5 kg)  01/26/17 183 lb 12.8 oz (83.4 kg)     GEN:  Well nourished, well developed in no acute distress HEENT: Normal NECK: No JVD; No carotid bruits LYMPHATICS: No lymphadenopathy CARDIAC: RRR, no murmurs, no rubs, no gallops RESPIRATORY:  Clear to auscultation without rales, wheezing or rhonchi  ABDOMEN: Soft, non-tender, non-distended MUSCULOSKELETAL:  No edema; No deformity  SKIN: Warm and dry LOWER EXTREMITIES: no swelling NEUROLOGIC:  Alert and oriented x 3 PSYCHIATRIC:  Normal affect   ASSESSMENT:    1. Coronary artery disease involving native coronary artery of native heart without angina pectoris   2. PVC's (premature ventricular contractions)   3. Essential hypertension   4. LV dysfunction   5. Dyslipidemia    PLAN:    In order of problems listed above:  1. Coronary artery disease doing well from that point review, asymptomatic we will continue present management. 2. PVCs successfully  suppressed with mexiletine which I will continue.  I will ask him to have liver function test done today 3. Essential hypertension blood pressure well controlled we will continue present management. 4. Dyslipidemia off his statin which is surprising to me we will check his liver function test as well as a fasting lipid profile.   Medication Adjustments/Labs and Tests Ordered: Current medicines are reviewed at length with the patient today.  Concerns  regarding medicines are outlined above.  No orders of the defined types were placed in this encounter.  Medication changes: No orders of the defined types were placed in this encounter.   Signed, Park Liter, MD, [facility] 03/29/2018 10:50 AM    [place]

## 2018-03-29 NOTE — Patient Instructions (Signed)
Medication Instructions:  Your physician recommends that you continue on your current medications as directed. Please refer to the Current Medication list given to you today.  If you need a refill on your cardiac medications before your next appointment, please call your pharmacy.   Lab work: Your physician recommends that you return for lab work today: Lipids, cbc w/ diff, tsh, CMP If you have labs (blood work) drawn today and your tests are completely normal, you will receive your results only by: Marland Kitchen MyChart Message (if you have MyChart) OR . A paper copy in the mail If you have any lab test that is abnormal or we need to change your treatment, we will call you to review the results.  Testing/Procedures: None.   Follow-Up: At [facility], you and your health needs are our priority.  As part of our continuing mission to provide you with exceptional heart care, we have created designated Provider Care Teams.  These Care Teams include your primary Cardiologist (physician) and Advanced Practice Providers (APPs -  Physician Assistants and Nurse Practitioners) who all work together to provide you with the care you need, when you need it. You will need a follow up appointment in 5 months.  Please call our office 2 months in advance to schedule this appointment.  You may see No primary care provider on file. or another member of our [employer] Provider Team in [place]: Shirlee More, MD . Jyl Heinz, MD  Any Other Special Instructions Will Be Listed Below (If Applicable).

## 2018-03-30 LAB — CBC WITH DIFFERENTIAL/PLATELET
BASOS: 0 %
Basophils Absolute: 0 10*3/uL (ref 0.0–0.2)
EOS (ABSOLUTE): 0.2 10*3/uL (ref 0.0–0.4)
EOS: 1 %
HEMOGLOBIN: 14.4 g/dL (ref 13.0–17.7)
Hematocrit: 43.6 % (ref 37.5–51.0)
IMMATURE GRANS (ABS): 0 10*3/uL (ref 0.0–0.1)
IMMATURE GRANULOCYTES: 0 %
LYMPHS: 56 %
Lymphocytes Absolute: 8 10*3/uL — ABNORMAL HIGH (ref 0.7–3.1)
MCH: 30.9 pg (ref 26.6–33.0)
MCHC: 33 g/dL (ref 31.5–35.7)
MCV: 94 fL (ref 79–97)
MONOCYTES: 6 %
Monocytes Absolute: 0.9 10*3/uL (ref 0.1–0.9)
NEUTROS PCT: 37 %
Neutrophils Absolute: 5.2 10*3/uL (ref 1.4–7.0)
Platelets: 244 10*3/uL (ref 150–450)
RBC: 4.66 x10E6/uL (ref 4.14–5.80)
RDW: 13 % (ref 12.3–15.4)
WBC: 14.3 10*3/uL — ABNORMAL HIGH (ref 3.4–10.8)

## 2018-03-30 LAB — LIPID PANEL
CHOL/HDL RATIO: 3.4 ratio (ref 0.0–5.0)
Cholesterol, Total: 188 mg/dL (ref 100–199)
HDL: 56 mg/dL (ref >39)
LDL CALC: 110 mg/dL — AB (ref 0–99)
Triglycerides: 109 mg/dL (ref 0–149)
VLDL CHOLESTEROL CAL: 22 mg/dL (ref 5–40)

## 2018-03-30 LAB — COMPREHENSIVE METABOLIC PANEL
ALT: 22 IU/L (ref 0–44)
AST: 29 IU/L (ref 0–40)
Albumin/Globulin Ratio: 2.2 (ref 1.2–2.2)
Albumin: 4.1 g/dL (ref 3.5–4.7)
Alkaline Phosphatase: 55 IU/L (ref 39–117)
BUN/Creatinine Ratio: 15 (ref 10–24)
BUN: 15 mg/dL (ref 8–27)
Bilirubin Total: 0.5 mg/dL (ref 0.0–1.2)
CALCIUM: 9.8 mg/dL (ref 8.6–10.2)
CO2: 27 mmol/L (ref 20–29)
CREATININE: 1 mg/dL (ref 0.76–1.27)
Chloride: 100 mmol/L (ref 96–106)
GFR calc non Af Amer: 70 mL/min/{1.73_m2} (ref >59)
GFR, EST AFRICAN AMERICAN: 81 mL/min/{1.73_m2} (ref >59)
GLUCOSE: 101 mg/dL — AB (ref 65–99)
Globulin, Total: 1.9 g/dL (ref 1.5–4.5)
Potassium: 4.9 mmol/L (ref 3.5–5.2)
Sodium: 140 mmol/L (ref 134–144)
TOTAL PROTEIN: 6 g/dL (ref 6.0–8.5)

## 2018-03-30 LAB — TSH: TSH: 1.28 u[IU]/mL (ref 0.450–4.500)

## 2018-04-03 ENCOUNTER — Telehealth: Payer: Self-pay | Admitting: Emergency Medicine

## 2018-04-03 DIAGNOSIS — I2510 Atherosclerotic heart disease of native coronary artery without angina pectoris: Secondary | ICD-10-CM

## 2018-04-03 DIAGNOSIS — E785 Hyperlipidemia, unspecified: Secondary | ICD-10-CM

## 2018-04-04 ENCOUNTER — Encounter: Payer: Self-pay | Admitting: Emergency Medicine

## 2018-04-04 MED ORDER — ATORVASTATIN CALCIUM 10 MG PO TABS: 10.0000 mg | ORAL_TABLET | Freq: Every day | ORAL | 3 refills | Status: DC

## 2018-04-04 NOTE — Telephone Encounter (Signed)
Patient informed to start lipitor 10 mg daily and to have fasting labs drawn in 4 week.s Patient verbally understands.

## 2018-04-22 NOTE — Progress Notes (Signed)
Electrophysiology Office Note   Date:  04/23/2018   ID:  Nathan Hall, DOB 09/22/1934, MRN [id number]  PCP:  Myer Peer, MD  Cardiologist:  Andrey Spearman Primary Electrophysiologist:  Milaya Hora Meredith Leeds, MD    No chief complaint on file.    History of Present Illness: Nathan Hall is a 83 y.o. male who is being seen today for the evaluation of PVCs at the request of [name]. Presenting today for electrophysiology evaluation.  He has a history of CHF, CLL, coronary artery disease status post circumflex and LAD stents, hypertension, and hyperlipidemia.  He also has palpitations and was noted to have frequent PVCs.  He was found to be fatigued and thus his carvedilol was decreased.  Echo shows some improvement in LV function.  His main complaint today is of palpitations, weakness, and fatigue.  He brings in heart rate and blood pressure results and his heart rate gets into the 40s at times.  He feels these occasionally.  He wore a cardiac monitor that showed episodic ventricular bigeminy with some short ventricular runs.  He stopped his losartan recently due to hypotension and weakness as well.  He had continued to have issues with his palpitations.  Today, denies symptoms of palpitations, chest pain, shortness of breath, orthopnea, PND, lower extremity edema, claudication, dizziness, presyncope, syncope, bleeding, or neurologic sequela. [name].  He is feeling well.  He is noted no further episodes since his mexiletine was started.  He has had much more energy and has been able to do work around the house.   Past Medical History:  Diagnosis Date  . Cancer ([place])   . Cataract    were removed  . CHF (congestive heart failure) ([place])   . CLL (chronic lymphocytic leukemia) ([place])   . Coronary artery disease   . GERD (gastroesophageal reflux disease)   . Hyperlipidemia   . Hypertension    Past Surgical History:    Procedure Laterality Date  . APPENDECTOMY    . cardiac stents    . EYE SURGERY    . HERNIA REPAIR       Current Outpatient Medications  Medication Sig Dispense Refill  . aspirin EC 81 MG tablet Take 81 mg by mouth daily.    Marland Kitchen atorvastatin (LIPITOR) 10 MG tablet Take 1 tablet (10 mg total) by mouth daily. 90 tablet 3  . busPIRone (BUSPAR) 5 MG tablet Take 5 mg by mouth 3 (three) times daily as needed.    . carvedilol (COREG) 12.5 MG tablet Take 12.5 mg by mouth 2 (two) times daily with a meal.    . Cholecalciferol (VITAMIN D3) 2000 units capsule Take 1 capsule by mouth daily.    . CO ENZYME Q-10 PO Take 200 mg by mouth daily.    . furosemide (LASIX) 40 MG tablet Take 40 mg by mouth daily as needed for edema.    . mexiletine (MEXITIL) 150 MG capsule Take 1 capsule (150 mg total) by mouth 2 (two) times daily. 60 capsule 3  . multivitamin-lutein (OCUVITE-LUTEIN) CAPS capsule Take 1 capsule by mouth daily.    . nitroGLYCERIN (NITROSTAT) 0.4 MG SL tablet Place 1 tablet under the tongue as needed for chest pain.    . pantoprazole (PROTONIX) 40 MG tablet Take 40 mg by mouth daily.    . potassium chloride SA (K-DUR,KLOR-CON) 20 MEQ tablet Take 20 mEq by mouth daily.    . ranitidine (ZANTAC) 150 MG  capsule Take 150 mg by mouth daily.    Marland Kitchen spironolactone (ALDACTONE) 25 MG tablet Take 12.5 mg by mouth daily.     No current facility-administered medications for this visit.     Allergies:   Lisinopril; Niaspan [niacin er]; and Simvastatin   Social History:  The patient  reports that he has never smoked. He has never used smokeless tobacco. He reports that he does not drink alcohol or use drugs.   Family History:  The patient's family history includes Alzheimer's disease in his maternal grandfather; Cancer in his paternal grandmother; Congestive Heart Failure in his father; Heart Problems in his sister; Heart attack in his maternal grandmother; Pneumonia in his mother; Stomach cancer in his  father.    ROS:  Please see the history of present illness.   Otherwise, review of systems is positive for none.   All other systems are reviewed and negative.   PHYSICAL EXAM: VS:  BP 126/60   Pulse 77   Ht 5\' 7"  (1.702 m)   Wt 165 lb (74.8 kg)   BMI 25.84 kg/m  , BMI Body mass index is 25.84 kg/m. GEN: Well nourished, well developed, in no acute distress  HEENT: normal  Neck: no JVD, carotid bruits, or masses Cardiac: RRR; no murmurs, rubs, or gallops,no edema  Respiratory:  clear to auscultation bilaterally, normal work of breathing GI: soft, nontender, nondistended, + BS MS: no deformity or atrophy  Skin: warm and dry Neuro:  Strength and sensation are intact Psych: euthymic mood, full affect  EKG:  EKG is ordered today. Personal review of the ekg ordered shows SR, rate 77, IVCD    Recent Labs: 03/29/2018: ALT 22; BUN 15; Creatinine, Ser 1.00; Hemoglobin 14.4; Platelets 244; Potassium 4.9; Sodium 140; TSH 1.280    Lipid Panel     Component Value Date/Time   CHOL 188 03/29/2018 1103   TRIG 109 03/29/2018 1103   HDL 56 03/29/2018 1103   CHOLHDL 3.4 03/29/2018 1103   LDLCALC 110 (H) 03/29/2018 1103     Wt Readings from Last 3 Encounters:  04/23/18 165 lb (74.8 kg)  03/29/18 163 lb 12.8 oz (74.3 kg)  01/25/18 177 lb 6.4 oz (80.5 kg)      Other studies Reviewed: Additional studies/ records that were reviewed today include: TTE  10/09/17 Review of the above records today demonstrates:  Normal left ventricular systolic function Ejection fraction is visually estimated at 55% Moderately dilated left atrium. Mild mitral, aortic and tricuspid regurgitation. RVSP 51 mm Hg.   ASSESSMENT AND PLAN:  1.  PVCs: Previously in bigeminy.  He was put on mexiletine at his last visit.  He has had a significant improvement in his symptoms.  No PVCs noted on EKG today.  2.  Congestive heart failure: Ejection fraction has improved on most recent echo.  No changes.  3.   Coronary artery disease: No current chest pain.  No changes.  4.  Hypertension: Well-controlled today.  No changes.    Current medicines are reviewed at length with the patient today.   The patient does not have concerns regarding his medicines.  The following changes were made today:  none  Labs/ tests ordered today include:  Orders Placed This Encounter  Procedures  . EKG 12-Lead     Disposition:   FU with Roxine [place] 6 months  Signed, Kayslee Furey Meredith Leeds, MD  04/23/2018 9:27 AM     [facility] [address] [place] [address] [phone] (  office) [phone] (fax)

## 2018-04-23 ENCOUNTER — Ambulatory Visit (INDEPENDENT_AMBULATORY_CARE_PROVIDER_SITE_OTHER): Payer: PPO | Admitting: Cardiology

## 2018-04-23 ENCOUNTER — Encounter: Payer: Self-pay | Admitting: Cardiology

## 2018-04-23 VITALS — BP 126/60 | HR 77 | Ht 67.0 in | Wt 165.0 lb

## 2018-04-23 DIAGNOSIS — I2510 Atherosclerotic heart disease of native coronary artery without angina pectoris: Secondary | ICD-10-CM | POA: Diagnosis not present

## 2018-04-23 DIAGNOSIS — I428 Other cardiomyopathies: Secondary | ICD-10-CM

## 2018-04-23 DIAGNOSIS — I493 Ventricular premature depolarization: Secondary | ICD-10-CM | POA: Diagnosis not present

## 2018-04-23 DIAGNOSIS — I10 Essential (primary) hypertension: Secondary | ICD-10-CM

## 2018-04-23 MED ORDER — MEXILETINE HCL 150 MG PO CAPS: 150.0000 mg | ORAL_CAPSULE | Freq: Two times a day (BID) | ORAL | 6 refills | Status: DC

## 2018-04-23 NOTE — Addendum Note (Signed)
Addended by: Stanton Kidney on: 04/23/2018 09:28 AM   Modules accepted: Orders

## 2018-04-23 NOTE — Patient Instructions (Signed)
Medication Instructions:  Your physician recommends that you continue on your current medications as directed. Please refer to the Current Medication list given to you today.  * If you need a refill on your cardiac medications before your next appointment, please call your pharmacy.   Labwork: None ordered  Testing/Procedures: None ordered  Follow-Up: Your physician wants you to follow-up in: 6 months with Dr. Camnitz.  You will receive a reminder letter in the mail two months in advance. If you don't receive a letter, please call our office to schedule the follow-up appointment.  *Please note that any paperwork needing to be filled out by the provider will need to be addressed at the front desk prior to seeing the provider. Please note that any FMLA, disability or other documents regarding health condition is subject to a $25.00 charge that must be received prior to completion of paperwork in the form of a money order or check.  Thank you for choosing [facility]!!   Ismahan Lippman, RN [phone]        

## 2018-05-02 DIAGNOSIS — E785 Hyperlipidemia, unspecified: Secondary | ICD-10-CM | POA: Diagnosis not present

## 2018-05-03 LAB — LIPID PANEL
Chol/HDL Ratio: 2.2 ratio (ref 0.0–5.0)
Cholesterol, Total: 126 mg/dL (ref 100–199)
HDL: 58 mg/dL (ref >39)
LDL Calculated: 56 mg/dL (ref 0–99)
TRIGLYCERIDES: 58 mg/dL (ref 0–149)
VLDL CHOLESTEROL CAL: 12 mg/dL (ref 5–40)

## 2018-05-14 DIAGNOSIS — M79644 Pain in right finger(s): Secondary | ICD-10-CM | POA: Diagnosis not present

## 2018-05-14 DIAGNOSIS — M7989 Other specified soft tissue disorders: Secondary | ICD-10-CM | POA: Diagnosis not present

## 2018-07-02 DIAGNOSIS — L578 Other skin changes due to chronic exposure to nonionizing radiation: Secondary | ICD-10-CM | POA: Diagnosis not present

## 2018-07-02 DIAGNOSIS — C44319 Basal cell carcinoma of skin of other parts of face: Secondary | ICD-10-CM | POA: Diagnosis not present

## 2018-07-02 DIAGNOSIS — L821 Other seborrheic keratosis: Secondary | ICD-10-CM | POA: Diagnosis not present

## 2018-07-02 DIAGNOSIS — L570 Actinic keratosis: Secondary | ICD-10-CM | POA: Diagnosis not present

## 2018-07-28 DIAGNOSIS — F419 Anxiety disorder, unspecified: Secondary | ICD-10-CM | POA: Diagnosis not present

## 2018-07-28 DIAGNOSIS — J302 Other seasonal allergic rhinitis: Secondary | ICD-10-CM | POA: Diagnosis not present

## 2018-08-14 DIAGNOSIS — H353122 Nonexudative age-related macular degeneration, left eye, intermediate dry stage: Secondary | ICD-10-CM | POA: Diagnosis not present

## 2018-08-14 DIAGNOSIS — H26493 Other secondary cataract, bilateral: Secondary | ICD-10-CM | POA: Diagnosis not present

## 2018-08-15 DIAGNOSIS — H26491 Other secondary cataract, right eye: Secondary | ICD-10-CM | POA: Diagnosis not present

## 2018-08-28 DIAGNOSIS — E782 Mixed hyperlipidemia: Secondary | ICD-10-CM | POA: Diagnosis not present

## 2018-08-28 DIAGNOSIS — I420 Dilated cardiomyopathy: Secondary | ICD-10-CM | POA: Diagnosis not present

## 2018-08-29 ENCOUNTER — Telehealth: Payer: Self-pay | Admitting: Cardiology

## 2018-08-29 NOTE — Telephone Encounter (Signed)
Left message for patient to call office for poss TELEVISIT/LBW 4/1

## 2018-08-30 ENCOUNTER — Telehealth: Payer: Self-pay | Admitting: Cardiology

## 2018-08-30 NOTE — Telephone Encounter (Signed)
Virtual Visit Pre-Appointment Phone Call  Steps For Call:  1. Confirm consent - "In the setting of the current Covid19 crisis, you are scheduled for a (phone or video) visit with your provider on (date) at (time).  Just as we do with many in-office visits, in order for you to participate in this visit, we must obtain consent.  If you'd like, I can send this to your mychart (if signed up) or email for you to review.  Otherwise, I can obtain your verbal consent now.  All virtual visits are billed to your insurance company just like a normal visit would be.  By agreeing to a virtual visit, we'd like you to understand that the technology does not allow for your provider to perform an examination, and thus may limit your provider's ability to fully assess your condition.  Finally, though the technology is pretty good, we cannot assure that it will always work on either your or our end, and in the setting of a video visit, we may have to convert it to a phone-only visit.  In either situation, we cannot ensure that we have a secure connection.  Are you willing to proceed?"  2. Give patient instructions for WebEx download to smartphone as below if video visit  3. Advise patient to be prepared with any vital sign or heart rhythm information, their current medicines, and a piece of paper and pen handy for any instructions they may receive the day of their visit  4. Inform patient they will receive a phone call 15 minutes prior to their appointment time (may be from unknown caller ID) so they should be prepared to answer  5. Confirm that appointment type is correct in Epic appointment notes (video vs telephone)    TELEPHONE CALL NOTE  Nathan Hall has been deemed a candidate for a follow-up tele-health visit to limit community exposure during the Covid-19 pandemic. I spoke with the patient via phone to ensure availability of phone/video source, confirm preferred email & phone number, and discuss  instructions and expectations.  I reminded Nathan Hall to be prepared with any vital sign and/or heart rhythm information that could potentially be obtained via home monitoring, at the time of his visit. I reminded Nathan Hall to expect a phone call at the time of his visit if his visit.  Did the patient verbally acknowledge consent to treatment? YES  Nathan Hall 08/30/2018 9:07 AM   DOWNLOADING THE [place]  - If Apple, go to [employer] and type in WebEx in the search bar. [place] [employer], the blue/green circle. The app is free but as with any other app downloads, their phone may require them to verify saved payment information or Apple password. The patient does NOT have to create an account.  - If Android, ask patient to go to [employer] and type in WebEx in the search bar. [place] [employer], the blue/green circle. The app is free but as with any other app downloads, their phone may require them to verify saved payment information or Android password. The patient does NOT have to create an account.   CONSENT FOR TELE-HEALTH VISIT - PLEASE REVIEW  I hereby voluntarily request, consent and authorize [facility] and its employed or contracted physicians, physician assistants, nurse practitioners or other licensed health care professionals (the Practitioner), to provide me with telemedicine health care services (the Services") as deemed necessary by the treating Practitioner. I  acknowledge and consent to receive the Services by the Practitioner via telemedicine. I understand that the telemedicine visit will involve communicating with the Practitioner through live audiovisual communication technology and the disclosure of certain medical information by electronic transmission. I acknowledge that I have been given the opportunity to request an in-person assessment or other available alternative prior to the telemedicine visit and am  voluntarily participating in the telemedicine visit.  I understand that I have the right to withhold or withdraw my consent to the use of telemedicine in the course of my care at any time, without affecting my right to future care or treatment, and that the Practitioner or I may terminate the telemedicine visit at any time. I understand that I have the right to inspect all information obtained and/or recorded in the course of the telemedicine visit and may receive copies of available information for a reasonable fee.  I understand that some of the potential risks of receiving the Services via telemedicine include:   Delay or interruption in medical evaluation due to technological equipment failure or disruption;  Information transmitted may not be sufficient (e.g. poor resolution of images) to allow for appropriate medical decision making by the Practitioner; and/or   In rare instances, security protocols could fail, causing a breach of personal health information.  Furthermore, I acknowledge that it is my responsibility to provide information about my medical history, conditions and care that is complete and accurate to the best of my ability. I acknowledge that Practitioner's advice, recommendations, and/or decision may be based on factors not within their control, such as incomplete or inaccurate data provided by me or distortions of diagnostic images or specimens that may result from electronic transmissions. I understand that the practice of medicine is not an exact science and that Practitioner makes no warranties or guarantees regarding treatment outcomes. I acknowledge that I will receive a copy of this consent concurrently upon execution via email to the email address I last provided but may also request a printed copy by calling the office of [facility].    I understand that my insurance will be billed for this visit.   I have read or had this consent read to me.  I understand the  contents of this consent, which adequately explains the benefits and risks of the Services being provided via telemedicine.   I have been provided ample opportunity to ask questions regarding this consent and the Services and have had my questions answered to my satisfaction.  I give my informed consent for the services to be provided through the use of telemedicine in my medical care  By participating in this telemedicine visit I agree to the above.

## 2018-09-05 ENCOUNTER — Other Ambulatory Visit: Payer: Self-pay

## 2018-09-05 ENCOUNTER — Telehealth (INDEPENDENT_AMBULATORY_CARE_PROVIDER_SITE_OTHER): Payer: PPO | Admitting: Cardiology

## 2018-09-05 ENCOUNTER — Encounter: Payer: Self-pay | Admitting: Cardiology

## 2018-09-05 VITALS — BP 100/59 | HR 73 | Wt 162.0 lb

## 2018-09-05 DIAGNOSIS — E785 Hyperlipidemia, unspecified: Secondary | ICD-10-CM

## 2018-09-05 DIAGNOSIS — I2510 Atherosclerotic heart disease of native coronary artery without angina pectoris: Secondary | ICD-10-CM | POA: Diagnosis not present

## 2018-09-05 DIAGNOSIS — I493 Ventricular premature depolarization: Secondary | ICD-10-CM

## 2018-09-05 DIAGNOSIS — I420 Dilated cardiomyopathy: Secondary | ICD-10-CM

## 2018-09-05 NOTE — Addendum Note (Signed)
Addended by: Particia Nearing B on: 09/05/2018 02:18 PM   Modules accepted: Orders

## 2018-09-05 NOTE — Progress Notes (Signed)
Virtual Visit via Telephone Note   This visit type was conducted due to national recommendations for restrictions regarding the COVID-19 Pandemic (e.g. social distancing) in an effort to limit this patient's exposure and mitigate transmission in our community.  Due to his co-morbid illnesses, this patient is at least at moderate risk for complications without adequate follow up.  This format is felt to be most appropriate for this patient at this time.  The patient did not have access to video technology/had technical difficulties with video requiring transitioning to audio format only (telephone).  All issues noted in this document were discussed and addressed.  No physical exam could be performed with this format.  Please refer to the patient's chart for his  consent to telehealth for [facility].  Evaluation Performed:  Follow-up visit  This visit type was conducted due to national recommendations for restrictions regarding the COVID-19 Pandemic (e.g. social distancing).  This format is felt to be most appropriate for this patient at this time.  All issues noted in this document were discussed and addressed.  No physical exam was performed (except for noted visual exam findings with Video Visits).  Please refer to the patient's chart (MyChart message for video visits and phone note for telephone visits) for the patient's consent to telehealth for [facility].  Date:  09/05/2018  ID: Nathan Hall, DOB July 16, 1934, MRN [id number]   Patient Location:  [place]  [address]   Provider location:   [place] Office  PCP:  Myer Peer, MD  Cardiologist:  Jenne Campus, MD     Chief Complaint: Doing well  History of Present Illness:    Nathan Hall is a 84 y.o. male  who presents via audio/video conferencing for a telehealth visit today.  Past medical history significant for cardiomyopathy however latest echocardiogram showed normalization of left  ventricular ejection fraction, frequent ventricular ectopy that being successfully managed with mexiletine.  Coronary disease, status post PTCA and stenting to circumflex and LAD years ago.  He also got chronic lymphocytic leukemia.  Overall he seems to be doing well denies having a chest pain tightness squeezing pressure burning chest he tells me that mexiletine helps a great deal and he is feeling much better with the medication.  Much less palpitations. interestingly problem he described to me he said that he gets some pain in his feet when he walks not in the calf not in the thighs does not look like a vascular pain I am worried he may have had some neuropathy.  Denies having any syncope no chest pain   The patient does not have symptoms concerning for COVID-19 infection (fever, chills, cough, or new SHORTNESS OF BREATH).    Prior CV studies:   The following studies were reviewed today:       Past Medical History:  Diagnosis Date  . Cancer ([place])   . Cataract    were removed  . CHF (congestive heart failure) ([place])   . CLL (chronic lymphocytic leukemia) ([place])   . Coronary artery disease   . GERD (gastroesophageal reflux disease)   . Hyperlipidemia   . Hypertension     Past Surgical History:  Procedure Laterality Date  . APPENDECTOMY    . cardiac stents    . EYE SURGERY    . HERNIA REPAIR       Current Meds  Medication Sig  . aspirin EC 81 MG tablet Take 81 mg by mouth daily.  Marland Kitchen  atorvastatin (LIPITOR) 10 MG tablet Take 1 tablet (10 mg total) by mouth daily.  . busPIRone (BUSPAR) 5 MG tablet Take 5 mg by mouth 3 (three) times daily as needed.  . carvedilol (COREG) 25 MG tablet Take 25 mg by mouth 2 (two) times daily with a meal.   . Cholecalciferol (VITAMIN D3) 2000 units capsule Take 1 capsule by mouth daily.  . CO ENZYME Q-10 PO Take 200 mg by mouth daily.  . furosemide (LASIX) 40 MG tablet Take 40 mg by mouth daily as needed for edema.  . mexiletine (MEXITIL) 150 MG  capsule Take 1 capsule (150 mg total) by mouth 2 (two) times daily.  . multivitamin-lutein (OCUVITE-LUTEIN) CAPS capsule Take 1 capsule by mouth daily.  . nitroGLYCERIN (NITROSTAT) 0.4 MG SL tablet Place 1 tablet under the tongue as needed for chest pain.  . pantoprazole (PROTONIX) 40 MG tablet Take 40 mg by mouth daily.  . potassium chloride SA (K-DUR,KLOR-CON) 20 MEQ tablet Take 20 mEq by mouth daily.  . ranitidine (ZANTAC) 150 MG capsule Take 150 mg by mouth daily.  Marland Kitchen spironolactone (ALDACTONE) 25 MG tablet Take 12.5 mg by mouth daily.      Family History: The patient's family history includes Alzheimer's disease in his maternal grandfather; Cancer in his paternal grandmother; Congestive Heart Failure in his father; Heart Problems in his sister; Heart attack in his maternal grandmother; Pneumonia in his mother; Stomach cancer in his father.   ROS:   Please see the history of present illness.     All other systems reviewed and are negative.   Labs/Other Tests and Data Reviewed:     Recent Labs: 03/29/2018: ALT 22; BUN 15; Creatinine, Ser 1.00; Hemoglobin 14.4; Platelets 244; Potassium 4.9; Sodium 140; TSH 1.280  Recent Lipid Panel    Component Value Date/Time   CHOL 126 05/02/2018 0802   TRIG 58 05/02/2018 0802   HDL 58 05/02/2018 0802   CHOLHDL 2.2 05/02/2018 0802   LDLCALC 56 05/02/2018 0802      Exam:    Vital Signs:  BP (!) 100/59   Pulse 73   Wt 162 lb (73.5 kg)   BMI 25.37 kg/m     Wt Readings from Last 3 Encounters:  09/05/18 162 lb (73.5 kg)  04/23/18 165 lb (74.8 kg)  03/29/18 163 lb 12.8 oz (74.3 kg)     Well nourished, well developed male in no acute distress. Alert oriented x3 actually quite cheerful on the phone we have no difficulty talking.   Diagnosis for this visit:   1. Coronary artery disease involving native coronary artery of native heart without angina pectoris   2. Dilated cardiomyopathy ([place])   3. PVC's (premature ventricular  contractions)   4. Dyslipidemia      ASSESSMENT & PLAN:    1.  Coronary artery disease doing well from that point review denies have any chest pain tightness squeezing pressure burning chest. 2.  Dilated cardiomyopathy last echocardiogram showed normal left ventricular ejection fraction.  Echocardiogram need to be repeated however because of coronavirus situation this test will be postponed until situation stabilized 3.  PVCs successfully managed with mexiletine we will sent refill for the medication 4.  Dyslipidemia we will continue with Lipitor.  COVID-19 Education: The signs and symptoms of COVID-19 were discussed with the patient and how to seek care for testing (follow up with PCP or arrange E-visit).  The importance of social distancing was discussed today.  Patient Risk:   After full  review of this patients clinical status, I feel that they are at least moderate risk at this time.  Time:   Today, I have spent 15 minutes with the patient with telehealth technology discussing pt health issues. Visit was finished at 2:11 PM.    Medication Adjustments/Labs and Tests Ordered: Current medicines are reviewed at length with the patient today.  Concerns regarding medicines are outlined above.  No orders of the defined types were placed in this encounter.  Medication changes: No orders of the defined types were placed in this encounter.    Disposition: Follow-up in 4 months.  Echocardiogram will be ordered acuity level 3  Signed, Park Liter, MD, [facility] 09/05/2018 2:07 PM    [place]

## 2018-09-05 NOTE — Patient Instructions (Addendum)
Medication Instructions:  Your physician recommends that you continue on your current medications as directed. Please refer to the Current Medication list given to you today.   If you need a refill on your cardiac medications before your next appointment, please call your pharmacy.   Lab work: None.  If you have labs (blood work) drawn today and your tests are completely normal, you will receive your results only by: . MyChart Message (if you have MyChart) OR . A paper copy in the mail If you have any lab test that is abnormal or we need to change your treatment, we will call you to review the results.  Testing/Procedures: Your physician has requested that you have an echocardiogram. Echocardiography is a painless test that uses sound waves to create images of your heart. It provides your doctor with information about the size and shape of your heart and how well your heart's chambers and valves are working. This procedure takes approximately one hour. There are no restrictions for this procedure.    Follow-Up: At [facility], you and your health needs are our priority.  As part of our continuing mission to provide you with exceptional heart care, we have created designated Provider Care Teams.  These Care Teams include your primary Cardiologist (physician) and Advanced Practice Providers (APPs -  Physician Assistants and Nurse Practitioners) who all work together to provide you with the care you need, when you need it. You will need a follow up appointment in 4 months.  Please call our office 2 months in advance to schedule this appointment.  You may see No primary care provider on file. or another member of our [facility] Provider Team in [place]: Brian Munley, MD . Rajan Revankar, MD  Any Other Special Instructions Will Be Listed Below (If Applicable).     Echocardiogram An echocardiogram is a procedure that uses painless sound waves (ultrasound) to produce an image of the  heart. Images from an echocardiogram can provide important information about:  Signs of coronary artery disease (CAD).  Aneurysm detection. An aneurysm is a weak or damaged part of an artery wall that bulges out from the normal force of blood pumping through the body.  Heart size and shape. Changes in the size or shape of the heart can be associated with certain conditions, including heart failure, aneurysm, and CAD.  Heart muscle function.  Heart valve function.  Signs of a past heart attack.  Fluid buildup around the heart.  Thickening of the heart muscle.  A tumor or infectious growth around the heart valves. Tell a health care provider about:  Any allergies you have.  All medicines you are taking, including vitamins, herbs, eye drops, creams, and over-the-counter medicines.  Any blood disorders you have.  Any surgeries you have had.  Any medical conditions you have.  Whether you are pregnant or may be pregnant. What are the risks? Generally, this is a safe procedure. However, problems may occur, including:  Allergic reaction to dye (contrast) that may be used during the procedure. What happens before the procedure? No specific preparation is needed. You may eat and drink normally. What happens during the procedure?   An IV tube may be inserted into one of your veins.  You may receive contrast through this tube. A contrast is an injection that improves the quality of the pictures from your heart.  A gel will be applied to your chest.  A wand-like tool (transducer) will be moved over your chest. The gel   will help to transmit the sound waves from the transducer.  The sound waves will harmlessly bounce off of your heart to allow the heart images to be captured in real-time motion. The images will be recorded on a computer. The procedure may vary among health care providers and hospitals. What happens after the procedure?  You may return to your normal, everyday  life, including diet, activities, and medicines, unless your health care provider tells you not to do that. Summary  An echocardiogram is a procedure that uses painless sound waves (ultrasound) to produce an image of the heart.  Images from an echocardiogram can provide important information about the size and shape of your heart, heart muscle function, heart valve function, and fluid buildup around your heart.  You do not need to do anything to prepare before this procedure. You may eat and drink normally.  After the echocardiogram is completed, you may return to your normal, everyday life, unless your health care provider tells you not to do that. This information is not intended to replace advice given to you by your health care provider. Make sure you discuss any questions you have with your health care provider. Document Released: 05/13/2000 Document Revised: 06/18/2016 Document Reviewed: 06/18/2016 Elsevier Interactive Patient Education  2019 Elsevier Inc.  

## 2018-09-27 DIAGNOSIS — K219 Gastro-esophageal reflux disease without esophagitis: Secondary | ICD-10-CM | POA: Diagnosis not present

## 2018-09-27 DIAGNOSIS — I10 Essential (primary) hypertension: Secondary | ICD-10-CM | POA: Diagnosis not present

## 2018-10-23 ENCOUNTER — Telehealth: Payer: Self-pay | Admitting: Cardiology

## 2018-10-23 NOTE — Telephone Encounter (Signed)
°*  STAT* If patient is at the pharmacy, call can be transferred to refill team.   1. Which medications need to be refilled? (please list name of each medication and dose if known)   mexiletine (MEXITIL) 150 MG capsule  2. Which pharmacy/location (including street and city if local pharmacy) is medication to be sent to?  [id number] - [place], [place] - [address]   3. Do they need a 30 day or 90 day supply? 74  Pt has a virtual visit scheduled for 06/01. He has one day of medication left

## 2018-10-24 MED ORDER — MEXILETINE HCL 150 MG PO CAPS: 150.0000 mg | ORAL_CAPSULE | Freq: Two times a day (BID) | ORAL | 6 refills | Status: DC

## 2018-10-24 NOTE — Telephone Encounter (Signed)
Rx sent 

## 2018-10-24 NOTE — Telephone Encounter (Signed)
This is a [facility] [place] pt °

## 2018-10-29 ENCOUNTER — Other Ambulatory Visit: Payer: Self-pay

## 2018-10-29 ENCOUNTER — Telehealth (INDEPENDENT_AMBULATORY_CARE_PROVIDER_SITE_OTHER): Payer: PPO | Admitting: Cardiology

## 2018-10-29 DIAGNOSIS — I493 Ventricular premature depolarization: Secondary | ICD-10-CM | POA: Diagnosis not present

## 2018-10-29 NOTE — Progress Notes (Signed)
Electrophysiology TeleHealth Note   Due to national recommendations of social distancing due to COVID 19, an audio/video telehealth visit is felt to be most appropriate for this patient at this time.  See Epic message for the patient's consent to telehealth for [facility].   Date:  10/29/2018   ID:  [name], DOB April 17, 1935, MRN [id number]  Location: patient's home  Provider location: [address], [place]  Evaluation Performed: Follow-up visit  PCP:  Myer Peer, MD  Cardiologist:  Agustin Cree Electrophysiologist:  Dr Curt Bears  Chief Complaint:  PVC  History of Present Illness:    MADEX SEALS is a 84 y.o. male who presents via audio/video conferencing for a telehealth visit today.  Since last being seen in our clinic, the patient reports doing very well.  Today, he denies symptoms of palpitations, chest pain, shortness of breath,  lower extremity edema, dizziness, presyncope, or syncope.  The patient is otherwise without complaint today.  The patient denies symptoms of fevers, chills, cough, or new SOB worrisome for COVID 19.  He has a history of CHF, CLL, coronary artery disease status post circumflex and LAD stents, hypertension, hyperlipidemia.  He was started on mexiletine.  Today, denies symptoms of palpitations, chest pain, shortness of breath, orthopnea, PND, lower extremity edema, claudication, dizziness, presyncope, syncope, bleeding, or neurologic sequela. [name].  Overall he is doing well.  He has noted no further palpitations.  He is able to do all of his daily activities.  He is tolerating the mexiletine without issue.  Past Medical History:  Diagnosis Date  . Cancer ([place])   . Cataract    were removed  . CHF (congestive heart failure) ([place])   . CLL (chronic lymphocytic leukemia) ([place])   . Coronary artery disease   . GERD (gastroesophageal reflux disease)   . Hyperlipidemia   .  Hypertension     Past Surgical History:  Procedure Laterality Date  . APPENDECTOMY    . cardiac stents    . EYE SURGERY    . HERNIA REPAIR      Current Outpatient Medications  Medication Sig Dispense Refill  . aspirin EC 81 MG tablet Take 81 mg by mouth daily.    Marland Kitchen atorvastatin (LIPITOR) 10 MG tablet Take 1 tablet (10 mg total) by mouth daily. 90 tablet 3  . busPIRone (BUSPAR) 5 MG tablet Take 5 mg by mouth 3 (three) times daily as needed.    . carvedilol (COREG) 25 MG tablet Take 25 mg by mouth 2 (two) times daily with a meal.     . Cholecalciferol (VITAMIN D3) 2000 units capsule Take 1 capsule by mouth daily.    . CO ENZYME Q-10 PO Take 200 mg by mouth daily.    . furosemide (LASIX) 40 MG tablet Take 40 mg by mouth daily as needed for edema.    . mexiletine (MEXITIL) 150 MG capsule Take 1 capsule (150 mg total) by mouth 2 (two) times daily. 60 capsule 6  . multivitamin-lutein (OCUVITE-LUTEIN) CAPS capsule Take 1 capsule by mouth daily.    . nitroGLYCERIN (NITROSTAT) 0.4 MG SL tablet Place 1 tablet under the tongue as needed for chest pain.    . pantoprazole (PROTONIX) 40 MG tablet Take 40 mg by mouth daily.    . potassium chloride SA (K-DUR,KLOR-CON) 20 MEQ tablet Take 20 mEq by mouth daily.    . ranitidine (ZANTAC) 150 MG capsule Take 150 mg  by mouth daily.    Marland Kitchen spironolactone (ALDACTONE) 25 MG tablet Take 12.5 mg by mouth daily.     No current facility-administered medications for this visit.     Allergies:   Lisinopril; Niaspan [niacin er]; and Simvastatin   Social History:  The patient  reports that he has never smoked. He has never used smokeless tobacco. He reports that he does not drink alcohol or use drugs.   Family History:  The patient's  family history includes Alzheimer's disease in his maternal grandfather; Cancer in his paternal grandmother; Congestive Heart Failure in his father; Heart Problems in his sister; Heart attack in his maternal grandmother; Pneumonia in  his mother; Stomach cancer in his father.   ROS:  Please see the history of present illness.   All other systems are personally reviewed and negative.    Exam:    Vital Signs:  BP 123/69   Pulse 63   Wt 170 lb (77.1 kg)   SpO2 97%   BMI 26.63 kg/m    Over the phone, no acute distress, no shortness of breath.  Labs/Other Tests and Data Reviewed:    Recent Labs: 03/29/2018: ALT 22; BUN 15; Creatinine, Ser 1.00; Hemoglobin 14.4; Platelets 244; Potassium 4.9; Sodium 140; TSH 1.280   Wt Readings from Last 3 Encounters:  10/29/18 170 lb (77.1 kg)  09/05/18 162 lb (73.5 kg)  04/23/18 165 lb (74.8 kg)     Other studies personally reviewed: Additional studies/ records that were reviewed today include: ECG 04/23/2018 personally reviewed Review of the above records today demonstrates: Sinus rhythm, lateral T wave inversions, anterior Q waves   ASSESSMENT & PLAN:    1.  PVCs: Currently on mexiletine.  Currently having no evidence of elevated PVCs.  No changes.  2.  Congestive heart failure: Ejection fraction improved to 55%.  No changes.  3.  Coronary artery disease: No chest pain.  No changes at this time.  4.  Hypertension: Currently well controlled   COVID 19 screen The patient denies symptoms of COVID 19 at this time.  The importance of social distancing was discussed today.  Follow-up: 6 months  Current medicines are reviewed at length with the patient today.   The patient does not have concerns regarding his medicines.  The following changes were made today:  none  Labs/ tests ordered today include:  No orders of the defined types were placed in this encounter.    Patient Risk:  after full review of this patients clinical status, I feel that they are at moderate risk at this time.  Today, I have spent 8 minutes with the patient with telehealth technology discussing PVCs.    Signed, Adair Lemar Meredith Leeds, MD  10/29/2018 9:25 AM     [facility] [address] [place] [address] [phone] (office) [phone] (fax)

## 2018-11-12 ENCOUNTER — Telehealth: Payer: Self-pay | Admitting: Emergency Medicine

## 2018-11-12 ENCOUNTER — Ambulatory Visit (INDEPENDENT_AMBULATORY_CARE_PROVIDER_SITE_OTHER): Payer: PPO

## 2018-11-12 ENCOUNTER — Other Ambulatory Visit: Payer: Self-pay

## 2018-11-12 DIAGNOSIS — I420 Dilated cardiomyopathy: Secondary | ICD-10-CM | POA: Diagnosis not present

## 2018-11-12 DIAGNOSIS — I2510 Atherosclerotic heart disease of native coronary artery without angina pectoris: Secondary | ICD-10-CM

## 2018-11-12 NOTE — Telephone Encounter (Signed)
Left message for patient to return call.

## 2018-11-12 NOTE — Progress Notes (Signed)
2D Echocardiogram performed     11/12/18  Cardell Peach RDCS, RVT

## 2018-11-21 ENCOUNTER — Other Ambulatory Visit: Payer: Self-pay | Admitting: Cardiology

## 2018-11-22 NOTE — Telephone Encounter (Signed)
Atorvastatin refill sent

## 2018-11-27 DIAGNOSIS — I420 Dilated cardiomyopathy: Secondary | ICD-10-CM | POA: Diagnosis not present

## 2018-11-27 DIAGNOSIS — F419 Anxiety disorder, unspecified: Secondary | ICD-10-CM | POA: Diagnosis not present

## 2018-12-03 DIAGNOSIS — Z79899 Other long term (current) drug therapy: Secondary | ICD-10-CM | POA: Diagnosis not present

## 2018-12-03 DIAGNOSIS — E782 Mixed hyperlipidemia: Secondary | ICD-10-CM | POA: Diagnosis not present

## 2018-12-14 DIAGNOSIS — E782 Mixed hyperlipidemia: Secondary | ICD-10-CM | POA: Diagnosis not present

## 2018-12-14 DIAGNOSIS — K219 Gastro-esophageal reflux disease without esophagitis: Secondary | ICD-10-CM | POA: Diagnosis not present

## 2018-12-14 DIAGNOSIS — Z6825 Body mass index (BMI) 25.0-25.9, adult: Secondary | ICD-10-CM | POA: Diagnosis not present

## 2018-12-14 DIAGNOSIS — F419 Anxiety disorder, unspecified: Secondary | ICD-10-CM | POA: Diagnosis not present

## 2018-12-14 DIAGNOSIS — I472 Ventricular tachycardia: Secondary | ICD-10-CM | POA: Diagnosis not present

## 2018-12-14 DIAGNOSIS — H3530 Unspecified macular degeneration: Secondary | ICD-10-CM | POA: Diagnosis not present

## 2018-12-14 DIAGNOSIS — C9110 Chronic lymphocytic leukemia of B-cell type not having achieved remission: Secondary | ICD-10-CM | POA: Diagnosis not present

## 2018-12-14 DIAGNOSIS — Z1331 Encounter for screening for depression: Secondary | ICD-10-CM | POA: Diagnosis not present

## 2018-12-14 DIAGNOSIS — I420 Dilated cardiomyopathy: Secondary | ICD-10-CM | POA: Diagnosis not present

## 2018-12-14 DIAGNOSIS — I10 Essential (primary) hypertension: Secondary | ICD-10-CM | POA: Diagnosis not present

## 2018-12-14 DIAGNOSIS — Z0000 Encounter for general adult medical examination without abnormal findings: Secondary | ICD-10-CM | POA: Diagnosis not present

## 2018-12-26 DIAGNOSIS — C9110 Chronic lymphocytic leukemia of B-cell type not having achieved remission: Secondary | ICD-10-CM | POA: Diagnosis not present

## 2018-12-28 DIAGNOSIS — K219 Gastro-esophageal reflux disease without esophagitis: Secondary | ICD-10-CM | POA: Diagnosis not present

## 2018-12-28 DIAGNOSIS — F419 Anxiety disorder, unspecified: Secondary | ICD-10-CM | POA: Diagnosis not present

## 2018-12-28 DIAGNOSIS — I10 Essential (primary) hypertension: Secondary | ICD-10-CM | POA: Diagnosis not present

## 2019-01-08 DIAGNOSIS — C44319 Basal cell carcinoma of skin of other parts of face: Secondary | ICD-10-CM | POA: Diagnosis not present

## 2019-01-08 DIAGNOSIS — C44729 Squamous cell carcinoma of skin of left lower limb, including hip: Secondary | ICD-10-CM | POA: Diagnosis not present

## 2019-01-08 DIAGNOSIS — L821 Other seborrheic keratosis: Secondary | ICD-10-CM | POA: Diagnosis not present

## 2019-01-08 DIAGNOSIS — L570 Actinic keratosis: Secondary | ICD-10-CM | POA: Diagnosis not present

## 2019-01-08 DIAGNOSIS — L578 Other skin changes due to chronic exposure to nonionizing radiation: Secondary | ICD-10-CM | POA: Diagnosis not present

## 2019-01-15 DIAGNOSIS — L570 Actinic keratosis: Secondary | ICD-10-CM | POA: Diagnosis not present

## 2019-01-15 DIAGNOSIS — C44729 Squamous cell carcinoma of skin of left lower limb, including hip: Secondary | ICD-10-CM | POA: Diagnosis not present

## 2019-01-16 ENCOUNTER — Telehealth: Payer: Self-pay | Admitting: Cardiology

## 2019-01-16 MED ORDER — CARVEDILOL 25 MG PO TABS: 25.0000 mg | ORAL_TABLET | Freq: Two times a day (BID) | ORAL | 1 refills | Status: DC

## 2019-01-16 NOTE — Telephone Encounter (Signed)
Carvedilol 25 mg twice daily refilled.

## 2019-01-16 NOTE — Addendum Note (Signed)
Addended by: [name] R on: 01/16/2019 11:01 AM   Modules accepted: Orders

## 2019-01-16 NOTE — Telephone Encounter (Signed)
[facility] [place]

## 2019-01-17 ENCOUNTER — Ambulatory Visit: Payer: PPO | Admitting: Cardiology

## 2019-02-01 ENCOUNTER — Encounter: Payer: Self-pay | Admitting: Cardiology

## 2019-02-01 ENCOUNTER — Ambulatory Visit (INDEPENDENT_AMBULATORY_CARE_PROVIDER_SITE_OTHER): Payer: PPO | Admitting: Cardiology

## 2019-02-01 ENCOUNTER — Other Ambulatory Visit: Payer: Self-pay

## 2019-02-01 VITALS — BP 128/66 | HR 77 | Ht 67.0 in | Wt 167.0 lb

## 2019-02-01 DIAGNOSIS — I10 Essential (primary) hypertension: Secondary | ICD-10-CM | POA: Diagnosis not present

## 2019-02-01 DIAGNOSIS — I2510 Atherosclerotic heart disease of native coronary artery without angina pectoris: Secondary | ICD-10-CM | POA: Diagnosis not present

## 2019-02-01 DIAGNOSIS — I493 Ventricular premature depolarization: Secondary | ICD-10-CM | POA: Diagnosis not present

## 2019-02-01 DIAGNOSIS — I420 Dilated cardiomyopathy: Secondary | ICD-10-CM

## 2019-02-01 NOTE — Progress Notes (Signed)
Cardiology Office Note:    Date:  02/01/2019   ID:  Nathan Hall, DOB Apr 24, 1935, MRN [id number]  PCP:  Myer Peer, MD  Cardiologist:  Jenne Campus, MD    Referring MD: Myer Peer, MD   Chief Complaint  Patient presents with  . Follow-up  Doing very well  History of Present Illness:    Nathan Hall is a 84 y.o. male with coronary artery disease, cardiomyopathy normalization of left ventricular ejection fraction, CLL, hypertension, dyslipidemia comes today to my office for follow-up overall doing great asymptomatic, no chest pain tightness squeezing pressure burning chest.  He is busy working the garden he has no difficulty doing it.  Overall feels good  Past Medical History:  Diagnosis Date  . Cancer ([place])   . Cataract    were removed  . CHF (congestive heart failure) ([place])   . CLL (chronic lymphocytic leukemia) ([place])   . Coronary artery disease   . GERD (gastroesophageal reflux disease)   . Hyperlipidemia   . Hypertension     Past Surgical History:  Procedure Laterality Date  . APPENDECTOMY    . cardiac stents    . EYE SURGERY    . HERNIA REPAIR      Current Medications: Current Meds  Medication Sig  . aspirin EC 81 MG tablet Take 81 mg by mouth daily.  Marland Kitchen atorvastatin (LIPITOR) 10 MG tablet TAKE 1 TABLET BY MOUTH EVERY DAY  . busPIRone (BUSPAR) 5 MG tablet Take 5 mg by mouth 3 (three) times daily as needed.  . carvedilol (COREG) 25 MG tablet Take 1 tablet (25 mg total) by mouth 2 (two) times daily with a meal.  . Cholecalciferol (VITAMIN D3) 2000 units capsule Take 1 capsule by mouth daily.  . CO ENZYME Q-10 PO Take 200 mg by mouth daily.  . furosemide (LASIX) 40 MG tablet Take 40 mg by mouth daily as needed for edema.  . mexiletine (MEXITIL) 150 MG capsule Take 1 capsule (150 mg total) by mouth 2 (two) times daily.  . multivitamin-lutein (OCUVITE-LUTEIN) CAPS capsule Take 1 capsule by mouth daily.  . nitroGLYCERIN (NITROSTAT) 0.4 MG SL tablet  Place 1 tablet under the tongue as needed for chest pain.  . pantoprazole (PROTONIX) 40 MG tablet Take 40 mg by mouth daily.  . potassium chloride SA (K-DUR,KLOR-CON) 20 MEQ tablet Take 20 mEq by mouth daily.  Marland Kitchen spironolactone (ALDACTONE) 25 MG tablet Take 12.5 mg by mouth daily.     Allergies:   Lisinopril, Niaspan [niacin er], and Simvastatin   Social History   Socioeconomic History  . Marital status: Married    Spouse name: Not on file  . Number of children: Not on file  . Years of education: Not on file  . Highest education level: Not on file  Occupational History  . Not on file  Social Needs  . Financial resource strain: Not on file  . Food insecurity    Worry: Not on file    Inability: Not on file  . Transportation needs    Medical: Not on file    Non-medical: Not on file  Tobacco Use  . Smoking status: Never Smoker  . Smokeless tobacco: Never Used  . Tobacco comment: States years ago/did not inhale/brief period/can't remeber dates  Substance and Sexual Activity  . Alcohol use: No  . Drug use: No  . Sexual activity: Not on file  Lifestyle  . Physical activity    Days per week: Not  on file    Minutes per session: Not on file  . Stress: Not on file  Relationships  . Social Herbalist on phone: Not on file    Gets together: Not on file    Attends religious service: Not on file    Active member of club or organization: Not on file    Attends meetings of clubs or organizations: Not on file    Relationship status: Not on file  Other Topics Concern  . Not on file  Social History Narrative  . Not on file     Family History: The patient's family history includes Alzheimer's disease in his maternal grandfather; Cancer in his paternal grandmother; Congestive Heart Failure in his father; Heart Problems in his sister; Heart attack in his maternal grandmother; Pneumonia in his mother; Stomach cancer in his father. ROS:   Please see the history of present  illness.    All 14 point review of systems negative except as described per history of present illness  EKGs/Labs/Other Studies Reviewed:      Recent Labs: 03/29/2018: ALT 22; BUN 15; Creatinine, Ser 1.00; Hemoglobin 14.4; Platelets 244; Potassium 4.9; Sodium 140; TSH 1.280  Recent Lipid Panel    Component Value Date/Time   CHOL 126 05/02/2018 0802   TRIG 58 05/02/2018 0802   HDL 58 05/02/2018 0802   CHOLHDL 2.2 05/02/2018 0802   LDLCALC 56 05/02/2018 0802    Physical Exam:    VS:  BP 128/66   Pulse 77   Ht 5\' 7"  (1.702 m)   Wt 167 lb (75.8 kg)   SpO2 98%   BMI 26.16 kg/m     Wt Readings from Last 3 Encounters:  02/01/19 167 lb (75.8 kg)  10/29/18 170 lb (77.1 kg)  09/05/18 162 lb (73.5 kg)     GEN:  Well nourished, well developed in no acute distress HEENT: Normal NECK: No JVD; No carotid bruits LYMPHATICS: No lymphadenopathy CARDIAC: RRR, no murmurs, no rubs, no gallops RESPIRATORY:  Clear to auscultation without rales, wheezing or rhonchi  ABDOMEN: Soft, non-tender, non-distended MUSCULOSKELETAL:  No edema; No deformity  SKIN: Warm and dry LOWER EXTREMITIES: no swelling NEUROLOGIC:  Alert and oriented x 3 PSYCHIATRIC:  Normal affect   ASSESSMENT:    1. PVC's (premature ventricular contractions)   2. Essential hypertension   3. Coronary artery disease involving native coronary artery of native heart without angina pectoris   4. Dilated cardiomyopathy ([place])    PLAN:    In order of problems listed above:  1. PVCs successfully managed with minoxidil which will continue. 2. Essential hypertension blood pressure well controlled. 3. Coronary artery disease stable asymptomatic continue present management. 4. History of cardiomyopathy with normalization stable compensated doing well   Medication Adjustments/Labs and Tests Ordered: Current medicines are reviewed at length with the patient today.  Concerns regarding medicines are outlined above.  No orders  of the defined types were placed in this encounter.  Medication changes: No orders of the defined types were placed in this encounter.   Signed, Park Liter, MD, [facility] 02/01/2019 3:16 PM    [place]

## 2019-02-01 NOTE — Patient Instructions (Signed)
Medication Instructions:  Your physician recommends that you continue on your current medications as directed. Please refer to the Current Medication list given to you today.  If you need a refill on your cardiac medications before your next appointment, please call your pharmacy.   Lab work: None.   If you have labs (blood work) drawn today and your tests are completely normal, you will receive your results only by: . MyChart Message (if you have MyChart) OR . A paper copy in the mail If you have any lab test that is abnormal or we need to change your treatment, we will call you to review the results.  Testing/Procedures: None.   Follow-Up: At [facility], you and your health needs are our priority.  As part of our continuing mission to provide you with exceptional heart care, we have created designated Provider Care Teams.  These Care Teams include your primary Cardiologist (physician) and Advanced Practice Providers (APPs -  Physician Assistants and Nurse Practitioners) who all work together to provide you with the care you need, when you need it. You will need a follow up appointment in 6 months.  Please call our office 2 months in advance to schedule this appointment.  You may see No primary care provider on file. or another member of our [facility] Provider Team in [place]: Brian Munley, MD . Rajan Revankar, MD  Any Other Special Instructions Will Be Listed Below (If Applicable).    

## 2019-02-13 ENCOUNTER — Other Ambulatory Visit: Payer: Self-pay | Admitting: Cardiology

## 2019-02-27 DIAGNOSIS — I10 Essential (primary) hypertension: Secondary | ICD-10-CM | POA: Diagnosis not present

## 2019-02-27 DIAGNOSIS — K219 Gastro-esophageal reflux disease without esophagitis: Secondary | ICD-10-CM | POA: Diagnosis not present

## 2019-02-27 DIAGNOSIS — H3530 Unspecified macular degeneration: Secondary | ICD-10-CM | POA: Diagnosis not present

## 2019-03-18 DIAGNOSIS — Z23 Encounter for immunization: Secondary | ICD-10-CM | POA: Diagnosis not present

## 2019-03-29 DIAGNOSIS — K219 Gastro-esophageal reflux disease without esophagitis: Secondary | ICD-10-CM | POA: Diagnosis not present

## 2019-03-29 DIAGNOSIS — E782 Mixed hyperlipidemia: Secondary | ICD-10-CM | POA: Diagnosis not present

## 2019-03-29 DIAGNOSIS — F419 Anxiety disorder, unspecified: Secondary | ICD-10-CM | POA: Diagnosis not present

## 2019-04-18 DIAGNOSIS — L570 Actinic keratosis: Secondary | ICD-10-CM | POA: Diagnosis not present

## 2019-04-18 DIAGNOSIS — C44729 Squamous cell carcinoma of skin of left lower limb, including hip: Secondary | ICD-10-CM | POA: Diagnosis not present

## 2019-04-24 DIAGNOSIS — H353122 Nonexudative age-related macular degeneration, left eye, intermediate dry stage: Secondary | ICD-10-CM | POA: Diagnosis not present

## 2019-04-24 DIAGNOSIS — H5203 Hypermetropia, bilateral: Secondary | ICD-10-CM | POA: Diagnosis not present

## 2019-04-29 DIAGNOSIS — E782 Mixed hyperlipidemia: Secondary | ICD-10-CM | POA: Diagnosis not present

## 2019-04-29 DIAGNOSIS — F419 Anxiety disorder, unspecified: Secondary | ICD-10-CM | POA: Diagnosis not present

## 2019-04-29 DIAGNOSIS — K219 Gastro-esophageal reflux disease without esophagitis: Secondary | ICD-10-CM | POA: Diagnosis not present

## 2019-05-06 ENCOUNTER — Encounter: Payer: Self-pay | Admitting: Cardiology

## 2019-05-06 ENCOUNTER — Ambulatory Visit (INDEPENDENT_AMBULATORY_CARE_PROVIDER_SITE_OTHER): Payer: PPO | Admitting: Cardiology

## 2019-05-06 ENCOUNTER — Other Ambulatory Visit: Payer: Self-pay

## 2019-05-06 VITALS — BP 158/70 | HR 65 | Ht 67.0 in | Wt 171.4 lb

## 2019-05-06 DIAGNOSIS — I493 Ventricular premature depolarization: Secondary | ICD-10-CM | POA: Diagnosis not present

## 2019-05-06 NOTE — Progress Notes (Signed)
Electrophysiology Office Note   Date:  05/06/2019   ID:  Nathan Hall, DOB 01/08/1935, MRN [id number]  PCP:  Myer Peer, MD  Cardiologist:  Andrey Spearman Primary Electrophysiologist:  Will Meredith Leeds, MD    No chief complaint on file.    History of Present Illness: Nathan Hall is a 84 y.o. male who is being seen today for the evaluation of PVCs at the request of [name]. Presenting today for electrophysiology evaluation.  He has a history of CHF, CLL, coronary artery disease status post circumflex and LAD stents, hypertension, and hyperlipidemia.  He also has palpitations and was noted to have frequent PVCs.  He was found to be fatigued and thus his carvedilol was decreased.  Echo shows some improvement in LV function.  His main complaint today is of palpitations, weakness, and fatigue.  He brings in heart rate and blood pressure results and his heart rate gets into the 40s at times.  He feels these occasionally.  He wore a cardiac monitor that showed episodic ventricular bigeminy with some short ventricular runs.  He stopped his losartan recently due to hypotension and weakness as well.  He had continued to have issues with his palpitations.  Today, denies symptoms of palpitations, chest pain, shortness of breath, orthopnea, PND, lower extremity edema, claudication, dizziness, presyncope, syncope, bleeding, or neurologic sequela. [name].  He is currently feeling well.  He has no chest pain or shortness of breath.  He is able to do all her daily activities without restriction.  He has been going to cardiac rehab without issue.  Past Medical History:  Diagnosis Date  . Cancer ([place])   . Cataract    were removed  . CHF (congestive heart failure) ([place])   . CLL (chronic lymphocytic leukemia) ([place])   . Coronary artery disease   . GERD (gastroesophageal reflux disease)   . Hyperlipidemia   . Hypertension    Past  Surgical History:  Procedure Laterality Date  . APPENDECTOMY    . cardiac stents    . EYE SURGERY    . HERNIA REPAIR       Current Outpatient Medications  Medication Sig Dispense Refill  . aspirin EC 81 MG tablet Take 81 mg by mouth daily.    Marland Kitchen atorvastatin (LIPITOR) 10 MG tablet TAKE 1 TABLET BY MOUTH EVERY DAY 90 tablet 1  . busPIRone (BUSPAR) 5 MG tablet Take 5 mg by mouth 3 (three) times daily as needed.    . carvedilol (COREG) 25 MG tablet Take 1 tablet (25 mg total) by mouth 2 (two) times daily with a meal. 180 tablet 1  . Cholecalciferol (VITAMIN D3) 2000 units capsule Take 1 capsule by mouth daily.    . CO ENZYME Q-10 PO Take 200 mg by mouth daily.    . furosemide (LASIX) 40 MG tablet Take 40 mg by mouth daily as needed for edema.    . mexiletine (MEXITIL) 150 MG capsule Take 1 capsule (150 mg total) by mouth 2 (two) times daily. 60 capsule 6  . multivitamin-lutein (OCUVITE-LUTEIN) CAPS capsule Take 1 capsule by mouth daily.    . nitroGLYCERIN (NITROSTAT) 0.4 MG SL tablet Place 1 tablet under the tongue as needed for chest pain.    . pantoprazole (PROTONIX) 40 MG tablet Take 40 mg by mouth daily.    . potassium chloride SA (K-DUR,KLOR-CON) 20 MEQ tablet Take 20 mEq by mouth daily.    Marland Kitchen  spironolactone (ALDACTONE) 25 MG tablet Take 12.5 mg by mouth daily.     No current facility-administered medications for this visit.     Allergies:   Lisinopril, Niaspan [niacin er], and Simvastatin   Social History:  The patient  reports that he has never smoked. He has never used smokeless tobacco. He reports that he does not drink alcohol or use drugs.   Family History:  The patient's family history includes Alzheimer's disease in his maternal grandfather; Cancer in his paternal grandmother; Congestive Heart Failure in his father; Heart Problems in his sister; Heart attack in his maternal grandmother; Pneumonia in his mother; Stomach cancer in his father.    ROS:  Please see the history  of present illness.   Otherwise, review of systems is positive for none.   All other systems are reviewed and negative.   PHYSICAL EXAM: VS:  There were no vitals taken for this visit. , BMI There is no height or weight on file to calculate BMI. GEN: Well nourished, well developed, in no acute distress  HEENT: normal  Neck: no JVD, carotid bruits, or masses Cardiac: RRR; no murmurs, rubs, or gallops,no edema  Respiratory:  clear to auscultation bilaterally, normal work of breathing GI: soft, nontender, nondistended, + BS MS: no deformity or atrophy  Skin: warm and dry Neuro:  Strength and sensation are intact Psych: euthymic mood, full affect  EKG:  EKG is ordered today. Personal review of the ekg ordered shows sinus rhythm, IVCD, rate 65    Recent Labs: No results found for requested labs within last 8760 hours.    Lipid Panel     Component Value Date/Time   CHOL 126 05/02/2018 0802   TRIG 58 05/02/2018 0802   HDL 58 05/02/2018 0802   CHOLHDL 2.2 05/02/2018 0802   LDLCALC 56 05/02/2018 0802     Wt Readings from Last 3 Encounters:  02/01/19 167 lb (75.8 kg)  10/29/18 170 lb (77.1 kg)  09/05/18 162 lb (73.5 kg)      Other studies Reviewed: Additional studies/ records that were reviewed today include: TTE  10/09/17 Review of the above records today demonstrates:  Normal left ventricular systolic function Ejection fraction is visually estimated at 55% Moderately dilated left atrium. Mild mitral, aortic and tricuspid regurgitation. RVSP 51 mm Hg.   ASSESSMENT AND PLAN:  1.  PVCs: Currently on mexiletine.  Previously was in ventricular bigeminy but his symptoms have greatly improved within a normalization of his ejection fraction.  No changes.  2.  Congestive heart failure: Ejection fraction is improved on most recent echo.  No changes. 3.  Coronary artery disease: No current chest pain.  4.  Hypertension: elevated today but [id number] systolic at home. No changes.     Current medicines are reviewed at length with the patient today.   The patient does not have concerns regarding his medicines.  The following changes were made today:  none  Labs/ tests ordered today include:  No orders of the defined types were placed in this encounter.    Disposition:   FU with Will Camnitz 12 months  Signed, Will Meredith Leeds, MD  05/06/2019 9:32 AM     [facility] [address] [place] [address] [phone] (office) [phone] (fax)

## 2019-05-16 ENCOUNTER — Other Ambulatory Visit: Payer: Self-pay | Admitting: Cardiology

## 2019-06-29 DIAGNOSIS — F419 Anxiety disorder, unspecified: Secondary | ICD-10-CM | POA: Diagnosis not present

## 2019-06-29 DIAGNOSIS — K219 Gastro-esophageal reflux disease without esophagitis: Secondary | ICD-10-CM | POA: Diagnosis not present

## 2019-06-29 DIAGNOSIS — J302 Other seasonal allergic rhinitis: Secondary | ICD-10-CM | POA: Diagnosis not present

## 2019-07-03 ENCOUNTER — Other Ambulatory Visit: Payer: Self-pay | Admitting: Cardiology

## 2019-07-03 MED ORDER — CARVEDILOL 25 MG PO TABS: 25.0000 mg | ORAL_TABLET | Freq: Two times a day (BID) | ORAL | 0 refills | Status: DC

## 2019-07-03 NOTE — Telephone Encounter (Signed)
Carvedilol 25 mg twice daily refilled.

## 2019-07-03 NOTE — Telephone Encounter (Signed)
*  STAT* If patient is at the pharmacy, call can be transferred to refill team.   1. Which medications need to be refilled? (please list name of each medication and dose if known)  carvedilol (COREG) 25 MG tablet  2. Which pharmacy/location (including street and city if local pharmacy) is medication to be sent to? [id number] - [place], [place] - [address]  3. Do they need a 30 day or 90 day supply? 90 day

## 2019-07-28 DIAGNOSIS — I10 Essential (primary) hypertension: Secondary | ICD-10-CM | POA: Diagnosis not present

## 2019-07-28 DIAGNOSIS — E782 Mixed hyperlipidemia: Secondary | ICD-10-CM | POA: Diagnosis not present

## 2019-07-28 DIAGNOSIS — I509 Heart failure, unspecified: Secondary | ICD-10-CM | POA: Diagnosis not present

## 2019-07-31 ENCOUNTER — Encounter: Payer: Self-pay | Admitting: Cardiology

## 2019-07-31 ENCOUNTER — Ambulatory Visit (INDEPENDENT_AMBULATORY_CARE_PROVIDER_SITE_OTHER): Payer: PPO | Admitting: Cardiology

## 2019-07-31 ENCOUNTER — Other Ambulatory Visit: Payer: Self-pay

## 2019-07-31 VITALS — BP 148/74 | HR 83 | Temp 98.4°F | Ht 67.0 in | Wt 165.0 lb

## 2019-07-31 DIAGNOSIS — I10 Essential (primary) hypertension: Secondary | ICD-10-CM | POA: Diagnosis not present

## 2019-07-31 DIAGNOSIS — I420 Dilated cardiomyopathy: Secondary | ICD-10-CM | POA: Diagnosis not present

## 2019-07-31 DIAGNOSIS — I493 Ventricular premature depolarization: Secondary | ICD-10-CM

## 2019-07-31 DIAGNOSIS — E785 Hyperlipidemia, unspecified: Secondary | ICD-10-CM

## 2019-07-31 DIAGNOSIS — I2510 Atherosclerotic heart disease of native coronary artery without angina pectoris: Secondary | ICD-10-CM | POA: Diagnosis not present

## 2019-07-31 NOTE — Progress Notes (Signed)
Cardiology Office Note:    Date:  07/31/2019   ID:  Nathan Hall, DOB 11/17/1934, MRN [id number]  PCP:  Street, Nathan Mt, MD  Cardiologist:  Nathan Campus, MD    Referring MD: Nathan Peer, MD   No chief complaint on file. Doing well  History of Present Illness:    Nathan Hall is a 85 y.o. male with past medical history significant for remote myocardial infarction in 2000, dyslipidemia, cardiomyopathy with ejection fraction 45%, essential hypertension, PVCs, successfully suppressed with mexiletine and beta-blocker.  He comes today to my office for follow-up overall seems to be doing well but started complaining of having some left foot drop.  And I told him this is most likely orthopedic/neurological problems however he told me he had very similar sensation before his myocardial infarction.  That would be higher than usual symptoms after a minute but he is concerned about it.  He is scheduled already to see his primary care physician to investigate his and I told him if this investigation is negative then we can come back to potentially investigating his heart.  Also offered him stress test and he declined at this stage.  He is also participating in exercises and have no difficulty doing it.  Past Medical History:  Diagnosis Date  . Cancer ([place])   . Cataract    were removed  . CHF (congestive heart failure) ([place])   . CLL (chronic lymphocytic leukemia) ([place])   . Coronary artery disease   . GERD (gastroesophageal reflux disease)   . Hyperlipidemia   . Hypertension     Past Surgical History:  Procedure Laterality Date  . APPENDECTOMY    . cardiac stents    . EYE SURGERY    . HERNIA REPAIR      Current Medications: Current Meds  Medication Sig  . aspirin EC 81 MG tablet Take 81 mg by mouth daily.  Marland Kitchen atorvastatin (LIPITOR) 10 MG tablet TAKE 1 TABLET BY MOUTH EVERY DAY  . busPIRone (BUSPAR) 10 MG tablet Take 10 mg by mouth 3 (three) times daily as needed.   .  carvedilol (COREG) 25 MG tablet Take 1 tablet (25 mg total) by mouth 2 (two) times daily with a meal.  . Cholecalciferol (VITAMIN D3) 2000 units capsule Take 1 capsule by mouth daily.  . CO ENZYME Q-10 PO Take 200 mg by mouth daily.  . fluticasone (FLONASE) 50 MCG/ACT nasal spray Place 2 sprays into both nostrils daily.  . furosemide (LASIX) 40 MG tablet Take 40 mg by mouth daily as needed for edema.  . mexiletine (MEXITIL) 150 MG capsule TAKE 1 CAPSULE BY MOUTH TWICE A DAY  . multivitamin-lutein (OCUVITE-LUTEIN) CAPS capsule Take 1 capsule by mouth daily.  . nitroGLYCERIN (NITROSTAT) 0.4 MG SL tablet Place 1 tablet under the tongue as needed for chest pain.  . Omega-3 Fatty Acids (FISH OIL) 1000 MG CAPS Take by mouth.  . pantoprazole (PROTONIX) 40 MG tablet Take 40 mg by mouth daily.  . potassium chloride SA (K-DUR,KLOR-CON) 20 MEQ tablet Take 20 mEq by mouth daily.  Marland Kitchen spironolactone (ALDACTONE) 25 MG tablet Take 12.5 mg by mouth daily.     Allergies:   Lisinopril, Niaspan [niacin er], and Simvastatin   Social History   Socioeconomic History  . Marital status: Married    Spouse name: Not on file  . Number of children: Not on file  . Years of education: Not on file  . Highest education level: Not on  file  Occupational History  . Not on file  Tobacco Use  . Smoking status: Never Smoker  . Smokeless tobacco: Never Used  . Tobacco comment: States years ago/did not inhale/brief period/can't remeber dates  Substance and Sexual Activity  . Alcohol use: No  . Drug use: No  . Sexual activity: Not Currently  Other Topics Concern  . Not on file  Social History Narrative  . Not on file   Social Determinants of Health   Financial Resource Strain:   . Difficulty of Paying Living Expenses: Not on file  Food Insecurity:   . Worried About Charity fundraiser in the Last Year: Not on file  . Ran Out of Food in the Last Year: Not on file  Transportation Needs:   . Lack of Transportation  (Medical): Not on file  . Lack of Transportation (Non-Medical): Not on file  Physical Activity:   . Days of Exercise per Week: Not on file  . Minutes of Exercise per Session: Not on file  Stress:   . Feeling of Stress : Not on file  Social Connections:   . Frequency of Communication with Friends and Family: Not on file  . Frequency of Social Gatherings with Friends and Family: Not on file  . Attends Religious Services: Not on file  . Active Member of Clubs or Organizations: Not on file  . Attends Archivist Meetings: Not on file  . Marital Status: Not on file     Family History: The patient's family history includes Alzheimer's disease in his maternal grandfather; Cancer in his paternal grandmother; Congestive Heart Failure in his father; Heart Problems in his sister; Heart attack in his maternal grandmother; Pneumonia in his mother; Stomach cancer in his father. ROS:   Please see the history of present illness.    All 14 point review of systems negative except as described per history of present illness  EKGs/Labs/Other Studies Reviewed:      Recent Labs: No results found for requested labs within last 8760 hours.  Recent Lipid Panel    Component Value Date/Time   CHOL 126 05/02/2018 0802   TRIG 58 05/02/2018 0802   HDL 58 05/02/2018 0802   CHOLHDL 2.2 05/02/2018 0802   LDLCALC 56 05/02/2018 0802    Physical Exam:    VS:  BP (!) 148/74   Pulse 83   Temp 98.4 F (36.9 C)   Ht 5\' 7"  (1.702 m)   Wt 165 lb (74.8 kg)   SpO2 95%   BMI 25.84 kg/m     Wt Readings from Last 3 Encounters:  07/31/19 165 lb (74.8 kg)  05/06/19 171 lb 6.4 oz (77.7 kg)  02/01/19 167 lb (75.8 kg)     GEN:  Well nourished, well developed in no acute distress HEENT: Normal NECK: No JVD; No carotid bruits LYMPHATICS: No lymphadenopathy CARDIAC: RRR, no murmurs, no rubs, no gallops RESPIRATORY:  Clear to auscultation without rales, wheezing or rhonchi  ABDOMEN: Soft, non-tender,  non-distended MUSCULOSKELETAL:  No edema; No deformity  SKIN: Warm and dry LOWER EXTREMITIES: no swelling NEUROLOGIC:  Alert and oriented x 3 PSYCHIATRIC:  Normal affect   ASSESSMENT:    1. Coronary artery disease involving native coronary artery of native heart without angina pectoris   2. Dilated cardiomyopathy ([place])   3. Essential hypertension   4. Dyslipidemia   5. PVC's (premature ventricular contractions)    PLAN:    In order of problems listed above:  1. Coronary  disease with remote myocardial infarction 2000.  Doing well controlled continue. 2. History of dilated cardiomyopathy his ejection fraction 45% he is on beta-blocker Aldactone, intolerant to lisinopril in the past.  Therefore we cannot put ACE inhibitor or ARB. 3. PVCs are suppressed with mexiletine as well as Coreg which I will continue 4. Dyslipidemia on Lipitor 10 which I will continue.  His last fasting lipid profile have on K PN showed LDL 75 HDL 42.  We will make arrangements for repeating fasting lipid profile.   Medication Adjustments/Labs and Tests Ordered: Current medicines are reviewed at length with the patient today.  Concerns regarding medicines are outlined above.  No orders of the defined types were placed in this encounter.  Medication changes: No orders of the defined types were placed in this encounter.   Signed, Park Liter, MD, [facility] 07/31/2019 2:36 PM    [place]

## 2019-07-31 NOTE — Patient Instructions (Signed)
Medication Instructions:  Your physician recommends that you continue on your current medications as directed. Please refer to the Current Medication list given to you today.  *If you need a refill on your cardiac medications before your next appointment, please call your pharmacy*   Lab Work: None.  If you have labs (blood work) drawn today and your tests are completely normal, you will receive your results only by: Marland Kitchen MyChart Message (if you have MyChart) OR . A paper copy in the mail If you have any lab test that is abnormal or we need to change your treatment, we will call you to review the results.   Testing/Procedures: None.    Follow-Up: At [facility], you and your health needs are our priority.  As part of our continuing mission to provide you with exceptional heart care, we have created designated Provider Care Teams.  These Care Teams include your primary Cardiologist (physician) and Advanced Practice Providers (APPs -  Physician Assistants and Nurse Practitioners) who all work together to provide you with the care you need, when you need it.  We recommend signing up for the patient portal called "MyChart".  Sign up information is provided on this After Visit Summary.  MyChart is used to connect with patients for Virtual Visits (Telemedicine).  Patients are able to view lab/test results, encounter notes, upcoming appointments, etc.  Non-urgent messages can be sent to your provider as well.   To learn more about what you can do with MyChart, go to [email or web address].    Your next appointment:   3 month(s)  The format for your next appointment:   In Person  Provider:   Jenne Campus, MD   Other Instruction

## 2019-08-06 DIAGNOSIS — M542 Cervicalgia: Secondary | ICD-10-CM | POA: Diagnosis not present

## 2019-08-06 DIAGNOSIS — E663 Overweight: Secondary | ICD-10-CM | POA: Diagnosis not present

## 2019-08-06 DIAGNOSIS — Z9181 History of falling: Secondary | ICD-10-CM | POA: Diagnosis not present

## 2019-08-06 DIAGNOSIS — M21372 Foot drop, left foot: Secondary | ICD-10-CM | POA: Diagnosis not present

## 2019-08-06 DIAGNOSIS — Z6825 Body mass index (BMI) 25.0-25.9, adult: Secondary | ICD-10-CM | POA: Diagnosis not present

## 2019-08-06 DIAGNOSIS — M5410 Radiculopathy, site unspecified: Secondary | ICD-10-CM | POA: Diagnosis not present

## 2019-08-28 DIAGNOSIS — E782 Mixed hyperlipidemia: Secondary | ICD-10-CM | POA: Diagnosis not present

## 2019-08-28 DIAGNOSIS — I10 Essential (primary) hypertension: Secondary | ICD-10-CM | POA: Diagnosis not present

## 2019-08-29 DIAGNOSIS — C9110 Chronic lymphocytic leukemia of B-cell type not having achieved remission: Secondary | ICD-10-CM | POA: Diagnosis not present

## 2019-09-06 ENCOUNTER — Other Ambulatory Visit: Payer: Self-pay | Admitting: Cardiology

## 2019-09-28 ENCOUNTER — Other Ambulatory Visit: Payer: Self-pay | Admitting: Cardiology

## 2019-09-30 MED ORDER — CARVEDILOL 25 MG PO TABS: 25.0000 mg | ORAL_TABLET | Freq: Two times a day (BID) | ORAL | 0 refills | Status: DC

## 2019-09-30 NOTE — Addendum Note (Signed)
Addended by: Georgana Curio D on: 09/30/2019 10:08 AM   Modules accepted: Orders

## 2019-10-17 DIAGNOSIS — C44329 Squamous cell carcinoma of skin of other parts of face: Secondary | ICD-10-CM | POA: Diagnosis not present

## 2019-10-17 DIAGNOSIS — L820 Inflamed seborrheic keratosis: Secondary | ICD-10-CM | POA: Diagnosis not present

## 2019-10-17 DIAGNOSIS — L578 Other skin changes due to chronic exposure to nonionizing radiation: Secondary | ICD-10-CM | POA: Diagnosis not present

## 2019-10-17 DIAGNOSIS — C44519 Basal cell carcinoma of skin of other part of trunk: Secondary | ICD-10-CM | POA: Diagnosis not present

## 2019-10-17 DIAGNOSIS — L821 Other seborrheic keratosis: Secondary | ICD-10-CM | POA: Diagnosis not present

## 2019-10-25 ENCOUNTER — Other Ambulatory Visit: Payer: Self-pay

## 2019-10-31 ENCOUNTER — Ambulatory Visit: Payer: PPO | Admitting: Cardiology

## 2019-10-31 ENCOUNTER — Encounter: Payer: Self-pay | Admitting: Cardiology

## 2019-10-31 ENCOUNTER — Other Ambulatory Visit: Payer: Self-pay

## 2019-10-31 VITALS — BP 113/68 | HR 76 | Ht 66.0 in | Wt 167.0 lb

## 2019-10-31 DIAGNOSIS — I2510 Atherosclerotic heart disease of native coronary artery without angina pectoris: Secondary | ICD-10-CM | POA: Diagnosis not present

## 2019-10-31 DIAGNOSIS — I493 Ventricular premature depolarization: Secondary | ICD-10-CM

## 2019-10-31 DIAGNOSIS — E785 Hyperlipidemia, unspecified: Secondary | ICD-10-CM | POA: Diagnosis not present

## 2019-10-31 DIAGNOSIS — I420 Dilated cardiomyopathy: Secondary | ICD-10-CM

## 2019-10-31 DIAGNOSIS — I10 Essential (primary) hypertension: Secondary | ICD-10-CM

## 2019-10-31 DIAGNOSIS — C44519 Basal cell carcinoma of skin of other part of trunk: Secondary | ICD-10-CM | POA: Diagnosis not present

## 2019-10-31 HISTORY — PX: BASAL CELL CARCINOMA EXCISION: SHX1214

## 2019-10-31 NOTE — Progress Notes (Signed)
Cardiology Office Note:    Date:  10/31/2019   ID:  Nathan Hall, DOB 12-Sep-1934, MRN [id number]  PCP:  Street, Sharon Mt, MD  Cardiologist:  Jenne Campus, MD    Referring MD: [address], Sharon Mt, *   No chief complaint on file. Doing fine  History of Present Illness:    Nathan Hall is a 85 y.o. male with past medical history significant for coronary artery disease, status post remote myocardial infarction 2000, dyslipidemia, cardiomyopathy with ejection fraction 45%, essential hypertension, PVCs that being successfully suppressed with mexiletine as well as beta-blocker.  He comes today to my office for follow-up overall he is doing well the last time we spoke he was complaining of having some numbness and tingling and foot drop on the left foot.  And he also mention that this is exactly the same symptoms he had before his myocardial infarction.  However today he tells me different story he said that when he got his heart attack he was weak on his legs.  Regardless he did see his primary care physician for his foot drop and there is some plans to see neurologist regarding that issue.  Denies have any chest pain, no palpitation no tightness no squeezing no pressure no burning in the chest.  Past Medical History:  Diagnosis Date  . Cancer ([place])   . Cataract    were removed  . CHF (congestive heart failure) ([place])   . CLL (chronic lymphocytic leukemia) ([place])   . Coronary artery disease   . GERD (gastroesophageal reflux disease)   . Hyperlipidemia   . Hypertension     Past Surgical History:  Procedure Laterality Date  . APPENDECTOMY    . BASAL CELL CARCINOMA EXCISION  10/31/2019  . cardiac stents    . EYE SURGERY    . HERNIA REPAIR      Current Medications: Current Meds  Medication Sig  . aspirin EC 81 MG tablet Take 81 mg by mouth daily.  Marland Kitchen atorvastatin (LIPITOR) 10 MG tablet TAKE 1 TABLET BY MOUTH EVERY DAY  . busPIRone (BUSPAR) 10 MG tablet Take 10 mg by mouth  3 (three) times daily as needed.   . carvedilol (COREG) 25 MG tablet Take 1 tablet (25 mg total) by mouth 2 (two) times daily with a meal.  . Cholecalciferol (VITAMIN D3) 2000 units capsule Take 1 capsule by mouth daily.  . CO ENZYME Q-10 PO Take 200 mg by mouth daily.  . fluticasone (FLONASE) 50 MCG/ACT nasal spray Place 2 sprays into both nostrils daily.  . furosemide (LASIX) 40 MG tablet Take 40 mg by mouth daily as needed for edema.  . mexiletine (MEXITIL) 150 MG capsule TAKE 1 CAPSULE BY MOUTH TWICE A DAY  . multivitamin-lutein (OCUVITE-LUTEIN) CAPS capsule Take 1 capsule by mouth daily.  . nitroGLYCERIN (NITROSTAT) 0.4 MG SL tablet Place 1 tablet under the tongue as needed for chest pain.  . Omega-3 Fatty Acids (FISH OIL) 1000 MG CAPS Take by mouth.  . pantoprazole (PROTONIX) 40 MG tablet Take 40 mg by mouth daily.  . potassium chloride SA (K-DUR,KLOR-CON) 20 MEQ tablet Take 20 mEq by mouth daily.  Marland Kitchen spironolactone (ALDACTONE) 25 MG tablet Take 12.5 mg by mouth daily.     Allergies:   Lisinopril, Niaspan [niacin er], and Simvastatin   Social History   Socioeconomic History  . Marital status: Married    Spouse name: Not on file  . Number of children: Not on file  . Years  of education: Not on file  . Highest education level: Not on file  Occupational History  . Not on file  Tobacco Use  . Smoking status: Never Smoker  . Smokeless tobacco: Never Used  . Tobacco comment: States years ago/did not inhale/brief period/can't remeber dates  Substance and Sexual Activity  . Alcohol use: No  . Drug use: No  . Sexual activity: Not Currently  Other Topics Concern  . Not on file  Social History Narrative  . Not on file   Social Determinants of Health   Financial Resource Strain:   . Difficulty of Paying Living Expenses:   Food Insecurity:   . Worried About Charity fundraiser in the Last Year:   . Arboriculturist in the Last Year:   Transportation Needs:   . Lexicographer (Medical):   Marland Kitchen Lack of Transportation (Non-Medical):   Physical Activity:   . Days of Exercise per Week:   . Minutes of Exercise per Session:   Stress:   . Feeling of Stress :   Social Connections:   . Frequency of Communication with Friends and Family:   . Frequency of Social Gatherings with Friends and Family:   . Attends Religious Services:   . Active Member of Clubs or Organizations:   . Attends Archivist Meetings:   Marland Kitchen Marital Status:      Family History: The patient's family history includes Alzheimer's disease in his maternal grandfather; Cancer in his paternal grandmother; Congestive Heart Failure in his father; Heart Problems in his sister; Heart attack in his maternal grandmother; Pneumonia in his mother; Stomach cancer in his father. ROS:   Please see the history of present illness.    All 14 point review of systems negative except as described per history of present illness  EKGs/Labs/Other Studies Reviewed:      Recent Labs: No results found for requested labs within last 8760 hours.  Recent Lipid Panel    Component Value Date/Time   CHOL 126 05/02/2018 0802   TRIG 58 05/02/2018 0802   HDL 58 05/02/2018 0802   CHOLHDL 2.2 05/02/2018 0802   LDLCALC 56 05/02/2018 0802    Physical Exam:    VS:  BP 113/68   Pulse 76   Ht 5\' 6"  (1.676 m)   Wt 167 lb (75.8 kg)   SpO2 94%   BMI 26.95 kg/m     Wt Readings from Last 3 Encounters:  10/31/19 167 lb (75.8 kg)  07/31/19 165 lb (74.8 kg)  05/06/19 171 lb 6.4 oz (77.7 kg)     GEN:  Well nourished, well developed in no acute distress HEENT: Normal NECK: No JVD; No carotid bruits LYMPHATICS: No lymphadenopathy CARDIAC: RRR, no murmurs, no rubs, no gallops RESPIRATORY:  Clear to auscultation without rales, wheezing or rhonchi  ABDOMEN: Soft, non-tender, non-distended MUSCULOSKELETAL:  No edema; No deformity  SKIN: Warm and dry LOWER EXTREMITIES: no swelling NEUROLOGIC:  Alert and  oriented x 3 PSYCHIATRIC:  Normal affect   ASSESSMENT:    1. PVC's (premature ventricular contractions)   2. Coronary artery disease involving native coronary artery of native heart without angina pectoris   3. Dilated cardiomyopathy ([place])   4. Essential hypertension   5. Dyslipidemia    PLAN:    In order of problems listed above:  1. PVCs suppressed with mexiletine as well as beta-blocker which I will continue. 2. Remote coronary artery disease with myocardial infarction 21 years ago.  Stable  doing well. 3. History of dilated cardiomyopathy with ejection fraction 45%.  He is on Coreg which I will continue, he is allergic to ARB and ACE inhibitor therefore he is not on this medication.  We will schedule him to have echocardiogram to reassess left ventricle ejection fraction. 4. Essential hypertension blood pressure well controlled today. 5. Dyslipidemia: I did review his K PN last cholesterol seeing him is from year ago showing LDL of 75 and HDL of 42.  We will schedule him to have fasting lipid profile done.   Medication Adjustments/Labs and Tests Ordered: Current medicines are reviewed at length with the patient today.  Concerns regarding medicines are outlined above.  No orders of the defined types were placed in this encounter.  Medication changes: No orders of the defined types were placed in this encounter.   Signed, Park Liter, MD, [facility] 10/31/2019 2:44 PM    [place]

## 2019-10-31 NOTE — Addendum Note (Signed)
Addended by: Ashok Norris on: 10/31/2019 02:54 PM   Modules accepted: Orders

## 2019-10-31 NOTE — Patient Instructions (Signed)
Medication Instructions:  Your physician recommends that you continue on your current medications as directed. Please refer to the Current Medication list given to you today.  *If you need a refill on your cardiac medications before your next appointment, please call your pharmacy*   Lab Work: Your physician recommends that you return for lab work today: lipids    If you have labs (blood work) drawn today and your tests are completely normal, you will receive your results only by: Marland Kitchen MyChart Message (if you have MyChart) OR . A paper copy in the mail If you have any lab test that is abnormal or we need to change your treatment, we will call you to review the results.   Testing/Procedures: Your physician has requested that you have an echocardiogram. Echocardiography is a painless test that uses sound waves to create images of your heart. It provides your doctor with information about the size and shape of your heart and how well your heart's chambers and valves are working. This procedure takes approximately one hour. There are no restrictions for this procedure.     Follow-Up: At [facility], you and your health needs are our priority.  As part of our continuing mission to provide you with exceptional heart care, we have created designated Provider Care Teams.  These Care Teams include your primary Cardiologist (physician) and Advanced Practice Providers (APPs -  Physician Assistants and Nurse Practitioners) who all work together to provide you with the care you need, when you need it.  We recommend signing up for the patient portal called "MyChart".  Sign up information is provided on this After Visit Summary.  MyChart is used to connect with patients for Virtual Visits (Telemedicine).  Patients are able to view lab/test results, encounter notes, upcoming appointments, etc.  Non-urgent messages can be sent to your provider as well.   To learn more about what you can do with MyChart, go  to [email or web address].    Your next appointment:   6 month(s)  The format for your next appointment:   In Person  Provider:   Jenne Campus, MD   Other Instructions   Echocardiogram An echocardiogram is a procedure that uses painless sound waves (ultrasound) to produce an image of the heart. Images from an echocardiogram can provide important information about:  Signs of coronary artery disease (CAD).  Aneurysm detection. An aneurysm is a weak or damaged part of an artery wall that bulges out from the normal force of blood pumping through the body.  Heart size and shape. Changes in the size or shape of the heart can be associated with certain conditions, including heart failure, aneurysm, and CAD.  Heart muscle function.  Heart valve function.  Signs of a past heart attack.  Fluid buildup around the heart.  Thickening of the heart muscle.  A tumor or infectious growth around the heart valves. Tell a health care provider about:  Any allergies you have.  All medicines you are taking, including vitamins, herbs, eye drops, creams, and over-the-counter medicines.  Any blood disorders you have.  Any surgeries you have had.  Any medical conditions you have.  Whether you are pregnant or may be pregnant. What are the risks? Generally, this is a safe procedure. However, problems may occur, including:  Allergic reaction to dye (contrast) that may be used during the procedure. What happens before the procedure? No specific preparation is needed. You may eat and drink normally. What happens during the procedure?   An IV  tube may be inserted into one of your veins.  You may receive contrast through this tube. A contrast is an injection that improves the quality of the pictures from your heart.  A gel will be applied to your chest.  A wand-like tool (transducer) will be moved over your chest. The gel will help to transmit the sound waves from the  transducer.  The sound waves will harmlessly bounce off of your heart to allow the heart images to be captured in real-time motion. The images will be recorded on a computer. The procedure may vary among health care providers and hospitals. What happens after the procedure?  You may return to your normal, everyday life, including diet, activities, and medicines, unless your health care provider tells you not to do that. Summary  An echocardiogram is a procedure that uses painless sound waves (ultrasound) to produce an image of the heart.  Images from an echocardiogram can provide important information about the size and shape of your heart, heart muscle function, heart valve function, and fluid buildup around your heart.  You do not need to do anything to prepare before this procedure. You may eat and drink normally.  After the echocardiogram is completed, you may return to your normal, everyday life, unless your health care provider tells you not to do that. This information is not intended to replace advice given to you by your health care provider. Make sure you discuss any questions you have with your health care provider. Document Revised: 09/06/2018 Document Reviewed: 06/18/2016 Elsevier Patient Education  [place].

## 2019-11-05 DIAGNOSIS — E785 Hyperlipidemia, unspecified: Secondary | ICD-10-CM | POA: Diagnosis not present

## 2019-11-05 LAB — LIPID PANEL
Chol/HDL Ratio: 3.2 ratio (ref 0.0–5.0)
Cholesterol, Total: 152 mg/dL (ref 100–199)
HDL: 48 mg/dL (ref >39)
LDL Chol Calc (NIH): 88 mg/dL (ref 0–99)
Triglycerides: 81 mg/dL (ref 0–149)
VLDL Cholesterol Cal: 16 mg/dL (ref 5–40)

## 2019-11-11 ENCOUNTER — Telehealth: Payer: Self-pay | Admitting: Cardiology

## 2019-11-11 NOTE — Telephone Encounter (Signed)
Called and spoke to patient's wife per dpr. Advised her that we cannot mail results anymore but he can view if he signs up for mychart or comes by the office to sign a release form. She verbally understood and will inform patient. No further questions.

## 2019-11-11 NOTE — Telephone Encounter (Signed)
New Message  Patient would like a copy of his recent lab work sent to him via mail. Please assist.

## 2019-11-15 ENCOUNTER — Telehealth: Payer: Self-pay | Admitting: Emergency Medicine

## 2019-11-15 ENCOUNTER — Telehealth: Payer: Self-pay | Admitting: Cardiology

## 2019-11-15 DIAGNOSIS — E785 Hyperlipidemia, unspecified: Secondary | ICD-10-CM

## 2019-11-15 MED ORDER — ATORVASTATIN CALCIUM 20 MG PO TABS: 20.0000 mg | ORAL_TABLET | Freq: Every day | ORAL | 1 refills | Status: DC

## 2019-11-15 NOTE — Telephone Encounter (Signed)
-----   Message from Park Liter, MD sent at 11/06/2019 10:20 AM EDT ----- Chol still elevated, increase lipitor to 20 mg po qd  Flp ast alt in 6 weeks

## 2019-11-15 NOTE — Telephone Encounter (Signed)
Patient returning Nathan Hall call from yesterday in regards to lab results.

## 2019-11-15 NOTE — Telephone Encounter (Signed)
Transferred call to [place] 

## 2019-11-15 NOTE — Telephone Encounter (Signed)
Please see additional encounter.

## 2019-11-15 NOTE — Telephone Encounter (Signed)
Called patient. Informed him per Dr. Agustin Cree to increase lipitor to 20 mg daily and to have labs rechecked fasting in 6 weeks. Patient verbally understood. No further questions.

## 2019-11-20 ENCOUNTER — Other Ambulatory Visit: Payer: PPO

## 2019-11-27 ENCOUNTER — Other Ambulatory Visit: Payer: PPO

## 2019-12-19 DIAGNOSIS — E663 Overweight: Secondary | ICD-10-CM | POA: Diagnosis not present

## 2019-12-19 DIAGNOSIS — F411 Generalized anxiety disorder: Secondary | ICD-10-CM | POA: Diagnosis not present

## 2019-12-19 DIAGNOSIS — I472 Ventricular tachycardia: Secondary | ICD-10-CM | POA: Diagnosis not present

## 2019-12-19 DIAGNOSIS — E782 Mixed hyperlipidemia: Secondary | ICD-10-CM | POA: Diagnosis not present

## 2019-12-19 DIAGNOSIS — I493 Ventricular premature depolarization: Secondary | ICD-10-CM | POA: Diagnosis not present

## 2019-12-19 DIAGNOSIS — I10 Essential (primary) hypertension: Secondary | ICD-10-CM | POA: Diagnosis not present

## 2019-12-19 DIAGNOSIS — Z6825 Body mass index (BMI) 25.0-25.9, adult: Secondary | ICD-10-CM | POA: Diagnosis not present

## 2019-12-19 DIAGNOSIS — I420 Dilated cardiomyopathy: Secondary | ICD-10-CM | POA: Diagnosis not present

## 2019-12-19 DIAGNOSIS — K219 Gastro-esophageal reflux disease without esophagitis: Secondary | ICD-10-CM | POA: Diagnosis not present

## 2019-12-19 DIAGNOSIS — C9110 Chronic lymphocytic leukemia of B-cell type not having achieved remission: Secondary | ICD-10-CM | POA: Diagnosis not present

## 2019-12-19 DIAGNOSIS — Z0000 Encounter for general adult medical examination without abnormal findings: Secondary | ICD-10-CM | POA: Diagnosis not present

## 2019-12-23 ENCOUNTER — Other Ambulatory Visit: Payer: Self-pay

## 2019-12-23 ENCOUNTER — Ambulatory Visit (INDEPENDENT_AMBULATORY_CARE_PROVIDER_SITE_OTHER): Payer: PPO

## 2019-12-23 DIAGNOSIS — I493 Ventricular premature depolarization: Secondary | ICD-10-CM

## 2019-12-23 DIAGNOSIS — I420 Dilated cardiomyopathy: Secondary | ICD-10-CM

## 2019-12-23 DIAGNOSIS — I2510 Atherosclerotic heart disease of native coronary artery without angina pectoris: Secondary | ICD-10-CM | POA: Diagnosis not present

## 2019-12-23 LAB — ECHOCARDIOGRAM COMPLETE
Area-P 1/2: 4.68 cm2
Calc EF: 33.7 %
P 1/2 time: 339 msec
S' Lateral: 4.4 cm
Single Plane A2C EF: 31.5 %
Single Plane A4C EF: 35.4 %

## 2019-12-23 NOTE — Progress Notes (Signed)
Complete echocardiogram performed.  Jimmy Aaryn Sermon RDCS, RVT  

## 2019-12-28 DIAGNOSIS — F411 Generalized anxiety disorder: Secondary | ICD-10-CM | POA: Diagnosis not present

## 2019-12-28 DIAGNOSIS — I10 Essential (primary) hypertension: Secondary | ICD-10-CM | POA: Diagnosis not present

## 2019-12-28 DIAGNOSIS — I420 Dilated cardiomyopathy: Secondary | ICD-10-CM | POA: Diagnosis not present

## 2019-12-28 DIAGNOSIS — K219 Gastro-esophageal reflux disease without esophagitis: Secondary | ICD-10-CM | POA: Diagnosis not present

## 2020-01-08 ENCOUNTER — Other Ambulatory Visit: Payer: Self-pay | Admitting: Cardiology

## 2020-01-08 NOTE — Telephone Encounter (Signed)
Rx refill sent to pharmacy. 

## 2020-01-28 DIAGNOSIS — F411 Generalized anxiety disorder: Secondary | ICD-10-CM | POA: Diagnosis not present

## 2020-01-28 DIAGNOSIS — K219 Gastro-esophageal reflux disease without esophagitis: Secondary | ICD-10-CM | POA: Diagnosis not present

## 2020-01-28 DIAGNOSIS — I10 Essential (primary) hypertension: Secondary | ICD-10-CM | POA: Diagnosis not present

## 2020-03-09 ENCOUNTER — Other Ambulatory Visit: Payer: Self-pay | Admitting: Hematology and Oncology

## 2020-03-09 DIAGNOSIS — C9110 Chronic lymphocytic leukemia of B-cell type not having achieved remission: Secondary | ICD-10-CM | POA: Diagnosis not present

## 2020-03-09 LAB — CBC AND DIFFERENTIAL
HCT: 43 (ref 41–53)
Hemoglobin: 14.1 (ref 13.5–17.5)
Neutrophils Absolute: 4350
Platelets: 221 (ref 150–399)
WBC: 13.6

## 2020-03-09 LAB — CBC: RBC: 4.52 (ref 3.87–5.11)

## 2020-03-28 DIAGNOSIS — F411 Generalized anxiety disorder: Secondary | ICD-10-CM | POA: Diagnosis not present

## 2020-03-28 DIAGNOSIS — I10 Essential (primary) hypertension: Secondary | ICD-10-CM | POA: Diagnosis not present

## 2020-03-28 DIAGNOSIS — K219 Gastro-esophageal reflux disease without esophagitis: Secondary | ICD-10-CM | POA: Diagnosis not present

## 2020-04-06 ENCOUNTER — Ambulatory Visit: Payer: PPO | Admitting: Cardiology

## 2020-04-06 ENCOUNTER — Other Ambulatory Visit: Payer: Self-pay

## 2020-04-06 ENCOUNTER — Encounter: Payer: Self-pay | Admitting: Cardiology

## 2020-04-06 VITALS — BP 146/72 | HR 63 | Ht 66.0 in | Wt 171.0 lb

## 2020-04-06 DIAGNOSIS — I493 Ventricular premature depolarization: Secondary | ICD-10-CM

## 2020-04-06 NOTE — Patient Instructions (Signed)
Medication Instructions:  Your physician recommends that you continue on your current medications as directed. Please refer to the Current Medication list given to you today.  *If you need a refill on your cardiac medications before your next appointment, please call your pharmacy*   Lab Work: None ordered   Testing/Procedures: None ordered   Follow-Up: At [facility], you and your health needs are our priority.  As part of our continuing mission to provide you with exceptional heart care, we have created designated Provider Care Teams.  These Care Teams include your primary Cardiologist (physician) and Advanced Practice Providers (APPs -  Physician Assistants and Nurse Practitioners) who all work together to provide you with the care you need, when you need it.  We recommend signing up for the patient portal called "MyChart".  Sign up information is provided on this After Visit Summary.  MyChart is used to connect with patients for Virtual Visits (Telemedicine).  Patients are able to view lab/test results, encounter notes, upcoming appointments, etc.  Non-urgent messages can be sent to your provider as well.   To learn more about what you can do with MyChart, go to [email or web address].    Your next appointment:   6 month(s)  The format for your next appointment:   In Person  Provider:   Will Camnitz, MD   Thank you for choosing [facility]!!   Kule Gascoigne, RN [phone]     

## 2020-04-06 NOTE — Progress Notes (Signed)
Electrophysiology Office Note   Date:  04/06/2020   ID:  Nathan Hall, DOB 14-Apr-1935, MRN [id number]  PCP:  Street, Nathan Mt, MD  Cardiologist:  Nathan Hall Primary Electrophysiologist:  Nathan Keeven Meredith Leeds, MD    No chief complaint on file.    History of Present Illness: Nathan Hall is a 85 y.o. male who is being seen today for the evaluation of PVCs at the request of Nathan Hall. Presenting today for electrophysiology evaluation.    He has a history of CHF, CLL, coronary artery disease status post circumflex and LAD stents, hypertension, hyperlipidemia.  He was noted to have frequent PVCs and has since been put on mexiletine.  Fortunately after mexiletine, his PVC burden improved and his ejection is improved.  Today, denies symptoms of palpitations, chest pain, shortness of breath, orthopnea, PND, lower extremity edema, claudication, dizziness, presyncope, syncope, bleeding, or neurologic sequela. [name].  He continues to do well.  He has noted no further palpitations.  He has no chest pain.  He goes to cardiac rehab 4 days a week and has been doing so for the last 20 years.   Past Medical History:  Diagnosis Date   Cancer [facility])    Cataract    were removed   CHF (congestive heart failure) (HCC)    CLL (chronic lymphocytic leukemia) (HCC)    Coronary artery disease    GERD (gastroesophageal reflux disease)    Hyperlipidemia    Hypertension    Past Surgical History:  Procedure Laterality Date   APPENDECTOMY     BASAL CELL CARCINOMA EXCISION  10/31/2019   cardiac stents     EYE SURGERY     HERNIA REPAIR       Current Outpatient Medications  Medication Sig Dispense Refill   aspirin EC 81 MG tablet Take 81 mg by mouth daily.     atorvastatin (LIPITOR) 20 MG tablet Take 1 tablet (20 mg total) by mouth daily. 90 tablet 1   busPIRone (BUSPAR) 10 MG tablet Take 10 mg by mouth 3 (three) times  daily as needed.      carvedilol (COREG) 25 MG tablet TAKE 1 TABLET (25 MG TOTAL) BY MOUTH 2 (TWO) TIMES DAILY WITH A MEAL. 180 tablet 1   Cholecalciferol (VITAMIN D3) 2000 units capsule Take 1 capsule by mouth daily.     CO ENZYME Q-10 PO Take 200 mg by mouth daily.     fluticasone (FLONASE) 50 MCG/ACT nasal spray Place 2 sprays into both nostrils daily.     furosemide (LASIX) 40 MG tablet Take 40 mg by mouth daily as needed for edema.     mexiletine (MEXITIL) 150 MG capsule TAKE 1 CAPSULE BY MOUTH TWICE A DAY 180 capsule 1   multivitamin-lutein (OCUVITE-LUTEIN) CAPS capsule Take 1 capsule by mouth daily.     nitroGLYCERIN (NITROSTAT) 0.4 MG SL tablet Place 1 tablet under the tongue as needed for chest pain.     Omega-3 Fatty Acids (FISH OIL) 1000 MG CAPS Take by mouth.     pantoprazole (PROTONIX) 40 MG tablet Take 40 mg by mouth daily.     potassium chloride SA (K-DUR,KLOR-CON) 20 MEQ tablet Take 20 mEq by mouth daily.     spironolactone (ALDACTONE) 25 MG tablet Take 12.5 mg by mouth daily.     No current facility-administered medications for this visit.    Allergies:   Lisinopril, Niaspan [niacin er], and Simvastatin   Social History:  The patient  reports that he has never smoked. He has never used smokeless tobacco. He reports that he does not drink alcohol and does not use drugs.   Family History:  The patient's family history includes Alzheimer's disease in his maternal grandfather; Cancer in his paternal grandmother; Congestive Heart Failure in his father; Heart Problems in his sister; Heart attack in his maternal grandmother; Pneumonia in his mother; Stomach cancer in his father.   ROS:  Please see the history of present illness.   Otherwise, review of systems is positive for none.   All other systems are reviewed and negative.   PHYSICAL EXAM: VS:  BP (!) 146/72    Pulse 63    Ht 5\' 6"  (1.676 m)    Wt 171 lb (77.6 kg)    SpO2 96%    BMI 27.60 kg/m  , BMI Body mass  index is 27.6 kg/m. GEN: Well nourished, well developed, in no acute distress  HEENT: normal  Neck: no JVD, carotid bruits, or masses Cardiac: RRR; no murmurs, rubs, or gallops,no edema  Respiratory:  clear to auscultation bilaterally, normal work of breathing GI: soft, nontender, nondistended, + BS MS: no deformity or atrophy  Skin: warm and dry Neuro:  Strength and sensation are intact Psych: euthymic mood, full affect  EKG:  EKG is ordered today. Personal review of the ekg ordered shows sinus rhythm, rate 63, IVCD  Recent Labs: 03/09/2020: Hemoglobin 14.1; Platelets 221    Lipid Panel     Component Value Date/Time   CHOL 152 11/05/2019 0813   TRIG 81 11/05/2019 0813   HDL 48 11/05/2019 0813   CHOLHDL 3.2 11/05/2019 0813   LDLCALC 88 11/05/2019 0813     Wt Readings from Last 3 Encounters:  04/06/20 171 lb (77.6 kg)  10/31/19 167 lb (75.8 kg)  07/31/19 165 lb (74.8 kg)      Other studies Reviewed: Additional studies/ records that were reviewed today include: TTE  10/09/17 Review of the above records today demonstrates:  Normal left ventricular systolic function Ejection fraction is visually estimated at 55% Moderately dilated left atrium. Mild mitral, aortic and tricuspid regurgitation. RVSP 51 mm Hg.   ASSESSMENT AND PLAN:  1.  PVCs: Currently on mexiletine (monitoring for high risk medication).  PVCs have greatly improved as has his ejection fraction.  His PVC burden remains low.  We Nathan Hall continue mexiletine.  2.  CHF: Ejection fraction improved on most recent echo.  No changes.  He continues to go to cardiac rehab without issue.  3.  Coronary artery disease: No current chest pain  4.  Hypertension: Elevated today.  He says it is usually in the 120s.  No changes.   Current medicines are reviewed at length with the patient today.   The patient does not have concerns regarding his medicines.  The following changes were made today: None  Labs/ tests  ordered today include:  Orders Placed This Encounter  Procedures   EKG 12-Lead     Disposition:   FU with Nathan Hall 6 months  Signed, Nathan Scovell Meredith Leeds, MD  04/06/2020 10:40 AM     [facility] [address] [place] [address] [phone] (office) [phone] (fax)

## 2020-05-01 ENCOUNTER — Other Ambulatory Visit: Payer: Self-pay

## 2020-05-01 DIAGNOSIS — C9110 Chronic lymphocytic leukemia of B-cell type not having achieved remission: Secondary | ICD-10-CM | POA: Insufficient documentation

## 2020-05-01 DIAGNOSIS — E785 Hyperlipidemia, unspecified: Secondary | ICD-10-CM | POA: Insufficient documentation

## 2020-05-01 DIAGNOSIS — I10 Essential (primary) hypertension: Secondary | ICD-10-CM | POA: Insufficient documentation

## 2020-05-01 DIAGNOSIS — K219 Gastro-esophageal reflux disease without esophagitis: Secondary | ICD-10-CM | POA: Insufficient documentation

## 2020-05-01 DIAGNOSIS — H269 Unspecified cataract: Secondary | ICD-10-CM | POA: Insufficient documentation

## 2020-05-01 DIAGNOSIS — I509 Heart failure, unspecified: Secondary | ICD-10-CM | POA: Insufficient documentation

## 2020-05-04 ENCOUNTER — Other Ambulatory Visit: Payer: Self-pay

## 2020-05-04 ENCOUNTER — Encounter: Payer: Self-pay | Admitting: Cardiology

## 2020-05-04 ENCOUNTER — Ambulatory Visit: Payer: PPO | Admitting: Cardiology

## 2020-05-04 VITALS — BP 110/60 | HR 70 | Ht 66.0 in | Wt 170.0 lb

## 2020-05-04 DIAGNOSIS — C9110 Chronic lymphocytic leukemia of B-cell type not having achieved remission: Secondary | ICD-10-CM

## 2020-05-04 DIAGNOSIS — I493 Ventricular premature depolarization: Secondary | ICD-10-CM | POA: Diagnosis not present

## 2020-05-04 DIAGNOSIS — I10 Essential (primary) hypertension: Secondary | ICD-10-CM | POA: Diagnosis not present

## 2020-05-04 DIAGNOSIS — I2510 Atherosclerotic heart disease of native coronary artery without angina pectoris: Secondary | ICD-10-CM

## 2020-05-04 DIAGNOSIS — I519 Heart disease, unspecified: Secondary | ICD-10-CM | POA: Diagnosis not present

## 2020-05-04 NOTE — Progress Notes (Signed)
Cardiology Office Note:    Date:  05/04/2020   ID:  Nathan Hall, DOB March 04, 1935, MRN [id number]  PCP:  Street, Sharon Mt, MD  Cardiologist:  Jenne Campus, MD    Referring MD: Street, Sharon Mt, *   Chief Complaint  Patient presents with  . Follow-up  I am doing fine  History of Present Illness:    Nathan Hall is a 85 y.o. male with past medical history significant for remote coronary artery disease status post myocardial infarction 2000, dyslipidemia, cardiomyopathy with ejection fraction 45%, essential hypertension, frequent PVCs successfully treated with beta-blocker as well as mexiletine.  He comes today 2 months of follow-up.  Overall he is doing well denies have any chest pain tightness squeezing pressure burning chest.  He admits that he slowed down some the biggest problem he is being suffering from is the pain in the right knee.  Denies have any palpitations no dizziness no passing out no chest pain tightness squeezing pressure burning chest.  Past Medical History:  Diagnosis Date  . Abnormal nuclear stress test 10/07/2016  . Aftercare following surgery 02/22/2018  . Cancer ([place])   . Cardiomyopathy ([place]) 12/09/2014   Overview:  Ejection fraction 30% May 2018  . Cataract    were removed  . Cataract    were removed  . CHF (congestive heart failure) ([place])   . CLL (chronic lymphocytic leukemia) ([place])   . Coronary artery disease   . Coronary artery disease involving native coronary artery of native heart without angina pectoris 12/09/2014   Formatting of this note might be different from the original. LAD and Cx stents 2018 Formatting of this note might be different from the original. PTCA and drug-eluting stent to left anterior descending artery and circumflex artery in May 2018  . Dyslipidemia 12/09/2014  . Essential hypertension 12/09/2014  . GERD (gastroesophageal reflux disease)   . Hyperlipidemia   . Hypertension   . Ingrown nail 02/15/2018  .  Intermittent lightheadedness 10/13/2016  . LV dysfunction 04/11/2017  . Pulmonary infiltrates on CXR 01/26/2017   Onset symptoms was late June 2018 and CT scan 12/05/16 c/w as dz LLL/ clear on f/u 01/26/2017  -       . PVC's (premature ventricular contractions) 10/07/2016  . Rhinitis, chronic 01/27/2017   Allergy profile 01/26/2017 >  Eos 0.1 /  IgE  53 RAST pos dust only     Past Surgical History:  Procedure Laterality Date  . APPENDECTOMY    . BASAL CELL CARCINOMA EXCISION  10/31/2019  . cardiac stents    . EYE SURGERY    . HERNIA REPAIR      Current Medications: Current Meds  Medication Sig  . aspirin EC 81 MG tablet Take 81 mg by mouth daily.  Marland Kitchen atorvastatin (LIPITOR) 20 MG tablet Take 1 tablet (20 mg total) by mouth daily.  . busPIRone (BUSPAR) 10 MG tablet Take 10 mg by mouth 3 (three) times daily as needed.   . carvedilol (COREG) 25 MG tablet TAKE 1 TABLET (25 MG TOTAL) BY MOUTH 2 (TWO) TIMES DAILY WITH A MEAL.  Marland Kitchen Cholecalciferol (VITAMIN D3) 2000 units capsule Take 1 capsule by mouth daily.  . CO ENZYME Q-10 PO Take 200 mg by mouth daily.  Marland Kitchen FLUAD QUADRIVALENT 0.5 ML injection Inject 0.5 mLs into the muscle as needed. Once a year  . fluticasone (FLONASE) 50 MCG/ACT nasal spray Place 2 sprays into both nostrils daily.  . furosemide (LASIX) 40 MG tablet Take  40 mg by mouth daily as needed for edema.  . mexiletine (MEXITIL) 150 MG capsule TAKE 1 CAPSULE BY MOUTH TWICE A DAY  . multivitamin-lutein (OCUVITE-LUTEIN) CAPS capsule Take 1 capsule by mouth daily.  . nitroGLYCERIN (NITROSTAT) 0.4 MG SL tablet Place 1 tablet under the tongue as needed for chest pain.  . Omega-3 Fatty Acids (FISH OIL) 1000 MG CAPS Take by mouth.  . pantoprazole (PROTONIX) 40 MG tablet Take 40 mg by mouth daily.  . potassium chloride SA (K-DUR,KLOR-CON) 20 MEQ tablet Take 20 mEq by mouth daily.  Marland Kitchen spironolactone (ALDACTONE) 25 MG tablet Take 12.5 mg by mouth daily.     Allergies:   Lisinopril, Niaspan  [niacin er], and Simvastatin   Social History   Socioeconomic History  . Marital status: Married    Spouse name: Not on file  . Number of children: Not on file  . Years of education: Not on file  . Highest education level: Not on file  Occupational History  . Not on file  Tobacco Use  . Smoking status: Never Smoker  . Smokeless tobacco: Never Used  . Tobacco comment: States years ago/did not inhale/brief period/can't remeber dates  Vaping Use  . Vaping Use: Never used  Substance and Sexual Activity  . Alcohol use: No  . Drug use: No  . Sexual activity: Not Currently  Other Topics Concern  . Not on file  Social History Narrative  . Not on file   Social Determinants of Health   Financial Resource Strain:   . Difficulty of Paying Living Expenses: Not on file  Food Insecurity:   . Worried About Charity fundraiser in the Last Year: Not on file  . Ran Out of Food in the Last Year: Not on file  Transportation Needs:   . Lack of Transportation (Medical): Not on file  . Lack of Transportation (Non-Medical): Not on file  Physical Activity:   . Days of Exercise per Week: Not on file  . Minutes of Exercise per Session: Not on file  Stress:   . Feeling of Stress : Not on file  Social Connections:   . Frequency of Communication with Friends and Family: Not on file  . Frequency of Social Gatherings with Friends and Family: Not on file  . Attends Religious Services: Not on file  . Active Member of Clubs or Organizations: Not on file  . Attends Archivist Meetings: Not on file  . Marital Status: Not on file     Family History: The patient's family history includes Alzheimer's disease in his maternal grandfather; Cancer in his paternal grandmother; Congestive Heart Failure in his father; Heart Problems in his sister; Heart attack in his maternal grandmother; Pneumonia in his mother; Stomach cancer in his father. ROS:   Please see the history of present illness.    All  14 point review of systems negative except as described per history of present illness  EKGs/Labs/Other Studies Reviewed:      Recent Labs: 03/09/2020: Hemoglobin 14.1; Platelets 221  Recent Lipid Panel    Component Value Date/Time   CHOL 152 11/05/2019 0813   TRIG 81 11/05/2019 0813   HDL 48 11/05/2019 0813   CHOLHDL 3.2 11/05/2019 0813   LDLCALC 88 11/05/2019 0813    Physical Exam:    VS:  BP 110/60 (BP Location: Left Arm, Patient Position: Sitting)   Pulse 70   Ht 5\' 6"  (1.676 m)   Wt 170 lb (77.1 kg)  SpO2 95%   BMI 27.44 kg/m     Wt Readings from Last 3 Encounters:  05/04/20 170 lb (77.1 kg)  04/06/20 171 lb (77.6 kg)  10/31/19 167 lb (75.8 kg)     GEN:  Well nourished, well developed in no acute distress HEENT: Normal NECK: No JVD; No carotid bruits LYMPHATICS: No lymphadenopathy CARDIAC: RRR, no murmurs, no rubs, no gallops RESPIRATORY:  Clear to auscultation without rales, wheezing or rhonchi  ABDOMEN: Soft, non-tender, non-distended MUSCULOSKELETAL:  No edema; No deformity  SKIN: Warm and dry LOWER EXTREMITIES: no swelling NEUROLOGIC:  Alert and oriented x 3 PSYCHIATRIC:  Normal affect   ASSESSMENT:    1. PVC's (premature ventricular contractions)   2. LV dysfunction   3. Essential hypertension   4. Coronary artery disease involving native coronary artery of native heart without angina pectoris   5. CLL (chronic lymphocytic leukemia) (HCC)    PLAN:    In order of problems listed above:  1. PVCs successfully suppressed with mexiletine as well as PVCs.  Will continue. 2. History of LV dysfunction with ejection fraction 45% last assessment in summer 2021.  We will continue present management which include Coreg 25 twice daily as well as lisinopril. 3. Essential hypertension: Blood pressure well controlled continue present management. 4. Remote coronary artery disease, he did have myocardial infarction 2020, no symptoms to suggest reactivation of  the problem. 5. CLL: Stable. 6. Dyslipidemia: I did review his K PN from 11/05/2019 showing LDL 88 HDL 48.  We will continue present management which include Lipitor 20.   Medication Adjustments/Labs and Tests Ordered: Current medicines are reviewed at length with the patient today.  Concerns regarding medicines are outlined above.  No orders of the defined types were placed in this encounter.  Medication changes: No orders of the defined types were placed in this encounter.   Signed, Park Liter, MD, [facility] 05/04/2020 9:38 AM    [place]

## 2020-05-04 NOTE — Patient Instructions (Signed)
Medication Instructions:  Your physician recommends that you continue on your current medications as directed. Please refer to the Current Medication list given to you today.  *If you need a refill on your cardiac medications before your next appointment, please call your pharmacy*   Lab Work: Your physician recommends that you return for lab work today: bmp   If you have labs (blood work) drawn today and your tests are completely normal, you will receive your results only by: . MyChart Message (if you have MyChart) OR . A paper copy in the mail If you have any lab test that is abnormal or we need to change your treatment, we will call you to review the results.   Testing/Procedures: None.    Follow-Up: At [facility], you and your health needs are our priority.  As part of our continuing mission to provide you with exceptional heart care, we have created designated Provider Care Teams.  These Care Teams include your primary Cardiologist (physician) and Advanced Practice Providers (APPs -  Physician Assistants and Nurse Practitioners) who all work together to provide you with the care you need, when you need it.  We recommend signing up for the patient portal called "MyChart".  Sign up information is provided on this After Visit Summary.  MyChart is used to connect with patients for Virtual Visits (Telemedicine).  Patients are able to view lab/test results, encounter notes, upcoming appointments, etc.  Non-urgent messages can be sent to your provider as well.   To learn more about what you can do with MyChart, go to [email or web address].    Your next appointment:   6 month(s)  The format for your next appointment:   In Person  Provider:   Robert Krasowski, MD   Other Instructions    

## 2020-05-04 NOTE — Addendum Note (Signed)
Addended by: Senaida Ores on: 05/04/2020 09:46 AM   Modules accepted: Orders

## 2020-05-05 LAB — BASIC METABOLIC PANEL
BUN/Creatinine Ratio: 13 (ref 10–24)
BUN: 16 mg/dL (ref 8–27)
CO2: 26 mmol/L (ref 20–29)
Calcium: 9.4 mg/dL (ref 8.6–10.2)
Chloride: 99 mmol/L (ref 96–106)
Creatinine, Ser: 1.22 mg/dL (ref 0.76–1.27)
GFR calc Af Amer: 63 mL/min/{1.73_m2} (ref >59)
GFR calc non Af Amer: 54 mL/min/{1.73_m2} — ABNORMAL LOW (ref >59)
Glucose: 106 mg/dL — ABNORMAL HIGH (ref 65–99)
Potassium: 4.7 mmol/L (ref 3.5–5.2)
Sodium: 137 mmol/L (ref 134–144)

## 2020-05-08 ENCOUNTER — Telehealth: Payer: Self-pay | Admitting: Cardiology

## 2020-05-08 NOTE — Telephone Encounter (Signed)
Called patient informed him of results.

## 2020-05-08 NOTE — Telephone Encounter (Signed)
    Patient calling for lab results 

## 2020-05-08 NOTE — Telephone Encounter (Signed)
Left message for patient to return call.

## 2020-05-08 NOTE — Telephone Encounter (Signed)
Nathan Hall is returning Hayley's call. Please advise.

## 2020-05-19 ENCOUNTER — Other Ambulatory Visit: Payer: Self-pay | Admitting: Cardiology

## 2020-06-17 ENCOUNTER — Other Ambulatory Visit: Payer: Self-pay | Admitting: Cardiology

## 2020-06-18 DIAGNOSIS — D485 Neoplasm of uncertain behavior of skin: Secondary | ICD-10-CM | POA: Diagnosis not present

## 2020-06-18 DIAGNOSIS — L309 Dermatitis, unspecified: Secondary | ICD-10-CM | POA: Diagnosis not present

## 2020-06-24 ENCOUNTER — Other Ambulatory Visit: Payer: Self-pay | Admitting: Cardiology

## 2020-06-24 NOTE — Telephone Encounter (Signed)
Rx refill sent to pharmacy. 

## 2020-06-25 DIAGNOSIS — C44629 Squamous cell carcinoma of skin of left upper limb, including shoulder: Secondary | ICD-10-CM | POA: Diagnosis not present

## 2020-09-26 DIAGNOSIS — I10 Essential (primary) hypertension: Secondary | ICD-10-CM | POA: Diagnosis not present

## 2020-09-26 DIAGNOSIS — F411 Generalized anxiety disorder: Secondary | ICD-10-CM | POA: Diagnosis not present

## 2020-09-26 DIAGNOSIS — I420 Dilated cardiomyopathy: Secondary | ICD-10-CM | POA: Diagnosis not present

## 2020-09-26 DIAGNOSIS — K219 Gastro-esophageal reflux disease without esophagitis: Secondary | ICD-10-CM | POA: Diagnosis not present

## 2020-10-11 NOTE — Progress Notes (Signed)
Electrophysiology Office Note   Date:  10/12/2020   ID:  Nathan Hall, DOB 1934/06/26, MRN [id number]  PCP:  Street, Sharon Mt, MD  Cardiologist:  Agustin Cree Primary Electrophysiologist:  Zemira Zehring Meredith Leeds, MD    No chief complaint on file.    History of Present Illness: Nathan Hall is a 86 y.o. male who is being seen today for the evaluation of PVCs at the request of [name]. Presenting today for electrophysiology evaluation.    He has a history of chronic systolic heart failure, CLL, coronary artery disease status post circumflex and LAD stents, hypertension, hyperlipidemia.  He was noted to have frequent PVCs.  He has since been put on mexiletine.  Fortunately his PVC burden has improved as has his ejection fraction.  Today, denies symptoms of palpitations, chest pain, shortness of breath, orthopnea, PND, lower extremity edema, claudication, dizziness, presyncope, syncope, bleeding, or neurologic sequela. [name].  Since being seen he has done well.  He has no chest pain or shortness of breath.  Is able to do all of his daily activities without restriction.  He is working outside in his yard.  He has quite a bit more energy now that his PVCs have been suppressed.   Past Medical History:  Diagnosis Date  . Abnormal nuclear stress test 10/07/2016  . Aftercare following surgery 02/22/2018  . Cancer ([place])   . Cardiomyopathy ([place]) 12/09/2014   Overview:  Ejection fraction 30% May 2018  . Cataract    were removed  . Cataract    were removed  . CHF (congestive heart failure) ([place])   . CLL (chronic lymphocytic leukemia) ([place])   . Coronary artery disease   . Coronary artery disease involving native coronary artery of native heart without angina pectoris 12/09/2014   Formatting of this note might be different from the original. LAD and Cx stents 2018 Formatting of this note might be different from the original. PTCA and  drug-eluting stent to left anterior descending artery and circumflex artery in May 2018  . Dyslipidemia 12/09/2014  . Essential hypertension 12/09/2014  . GERD (gastroesophageal reflux disease)   . Hyperlipidemia   . Hypertension   . Ingrown nail 02/15/2018  . Intermittent lightheadedness 10/13/2016  . LV dysfunction 04/11/2017  . Pulmonary infiltrates on CXR 01/26/2017   Onset symptoms was late June 2018 and CT scan 12/05/16 c/w as dz LLL/ clear on f/u 01/26/2017  -       . PVC's (premature ventricular contractions) 10/07/2016  . Rhinitis, chronic 01/27/2017   Allergy profile 01/26/2017 >  Eos 0.1 /  IgE  53 RAST pos dust only    Past Surgical History:  Procedure Laterality Date  . APPENDECTOMY    . BASAL CELL CARCINOMA EXCISION  10/31/2019  . cardiac stents    . EYE SURGERY    . HERNIA REPAIR       Current Outpatient Medications  Medication Sig Dispense Refill  . aspirin EC 81 MG tablet Take 81 mg by mouth daily.    Marland Kitchen atorvastatin (LIPITOR) 20 MG tablet TAKE 1 TABLET BY MOUTH EVERY DAY 90 tablet 1  . busPIRone (BUSPAR) 10 MG tablet Take 10 mg by mouth 3 (three) times daily as needed.     . carvedilol (COREG) 25 MG tablet TAKE 1 TABLET (25 MG TOTAL) BY MOUTH 2 (TWO) TIMES DAILY WITH A MEAL. 180 tablet 1  . Cholecalciferol (VITAMIN D3) 2000 units capsule Take  1 capsule by mouth daily.    . CO ENZYME Q-10 PO Take 200 mg by mouth daily.    Marland Kitchen FLUAD QUADRIVALENT 0.5 ML injection Inject 0.5 mLs into the muscle as needed. Once a year    . fluticasone (FLONASE) 50 MCG/ACT nasal spray Place 2 sprays into both nostrils daily.    . furosemide (LASIX) 40 MG tablet Take 40 mg by mouth daily as needed for edema.    . mexiletine (MEXITIL) 150 MG capsule TAKE 1 CAPSULE BY MOUTH TWICE A DAY 180 capsule 2  . multivitamin-lutein (OCUVITE-LUTEIN) CAPS capsule Take 1 capsule by mouth daily.    . nitroGLYCERIN (NITROSTAT) 0.4 MG SL tablet Place 1 tablet under the tongue as needed for chest pain.    . Omega-3  Fatty Acids (FISH OIL) 1000 MG CAPS Take by mouth.    . pantoprazole (PROTONIX) 40 MG tablet Take 40 mg by mouth daily.    . potassium chloride SA (K-DUR,KLOR-CON) 20 MEQ tablet Take 20 mEq by mouth daily.    Marland Kitchen spironolactone (ALDACTONE) 25 MG tablet Take 12.5 mg by mouth daily.     No current facility-administered medications for this visit.    Allergies:   Lisinopril, Niaspan [niacin er], and Simvastatin   Social History:  The patient  reports that he has never smoked. He has never used smokeless tobacco. He reports that he does not drink alcohol and does not use drugs.   Family History:  The patient's family history includes Alzheimer's disease in his maternal grandfather; Cancer in his paternal grandmother; Congestive Heart Failure in his father; Heart Problems in his sister; Heart attack in his maternal grandmother; Pneumonia in his mother; Stomach cancer in his father.   ROS:  Please see the history of present illness.   Otherwise, review of systems is positive for none.   All other systems are reviewed and negative.   PHYSICAL EXAM: VS:  BP 132/60   Pulse 62   Ht 5\' 6"  (1.676 m)   Wt 171 lb 9.6 oz (77.8 kg)   SpO2 96%   BMI 27.70 kg/m  , BMI Body mass index is 27.7 kg/m. GEN: Well nourished, well developed, in no acute distress  HEENT: normal  Neck: no JVD, carotid bruits, or masses Cardiac: RRR; no murmurs, rubs, or gallops,no edema  Respiratory:  clear to auscultation bilaterally, normal work of breathing GI: soft, nontender, nondistended, + BS MS: no deformity or atrophy  Skin: warm and dry Neuro:  Strength and sensation are intact Psych: euthymic mood, full affect  EKG:  EKG is ordered today. Personal review of the ekg ordered shows sinus rhythm, rate 62.  Recent Labs: 03/09/2020: Hemoglobin 14.1; Platelets 221 05/04/2020: BUN 16; Creatinine, Ser 1.22; Potassium 4.7; Sodium 137    Lipid Panel     Component Value Date/Time   CHOL 152 11/05/2019 0813   TRIG 81  11/05/2019 0813   HDL 48 11/05/2019 0813   CHOLHDL 3.2 11/05/2019 0813   LDLCALC 88 11/05/2019 0813     Wt Readings from Last 3 Encounters:  10/12/20 171 lb 9.6 oz (77.8 kg)  05/04/20 170 lb (77.1 kg)  04/06/20 171 lb (77.6 kg)      Other studies Reviewed: Additional studies/ records that were reviewed today include: TTE  10/09/17 Review of the above records today demonstrates:  Normal left ventricular systolic function Ejection fraction is visually estimated at 55% Moderately dilated left atrium. Mild mitral, aortic and tricuspid regurgitation. RVSP 51 mm Hg.  ASSESSMENT AND PLAN:  1.  PVCs: Currently on mexiletine.  High risk medication monitoring.  PVCs greatly improved.  Ejection fraction is normalized.  Continue with current management.    2.  Chronic systolic heart failure: Ejection fraction improved on most recent echo.  No changes at this time.  Continues to go to exercise without issue.  3.  Coronary artery disease: No current chest pain  4.  Hypertension: Well controlled   Current medicines are reviewed at length with the patient today.   The patient does not have concerns regarding his medicines.  The following changes were made today: none  Labs/ tests ordered today include:  Orders Placed This Encounter  Procedures  . EKG 12-Lead     Disposition:   FU with Joelyn Lover 6 months  Signed, Lambros Cerro Meredith Leeds, MD  10/12/2020 10:00 AM     [facility] [address] [place] [address] [phone] (office) [phone] (fax)

## 2020-10-12 ENCOUNTER — Encounter: Payer: Self-pay | Admitting: Cardiology

## 2020-10-12 ENCOUNTER — Ambulatory Visit: Payer: PPO | Admitting: Cardiology

## 2020-10-12 ENCOUNTER — Other Ambulatory Visit: Payer: Self-pay

## 2020-10-12 VITALS — BP 132/60 | HR 62 | Ht 66.0 in | Wt 171.6 lb

## 2020-10-12 DIAGNOSIS — I493 Ventricular premature depolarization: Secondary | ICD-10-CM

## 2020-10-12 NOTE — Patient Instructions (Signed)
Medication Instructions:  °Your physician recommends that you continue on your current medications as directed. Please refer to the Current Medication list given to you today. ° °*If you need a refill on your cardiac medications before your next appointment, please call your pharmacy* ° ° °Lab Work: °None ordered ° ° °Testing/Procedures: °None ordered ° ° °Follow-Up: °At [facility], you and your health needs are our priority.  As part of our continuing mission to provide you with exceptional heart care, we have created designated Provider Care Teams.  These Care Teams include your primary Cardiologist (physician) and Advanced Practice Providers (APPs -  Physician Assistants and Nurse Practitioners) who all work together to provide you with the care you need, when you need it. ° °Your next appointment:   °6 month(s) ° °The format for your next appointment:   °In Person ° °Provider:   °Will Camnitz, MD ° ° ° °Thank you for choosing [facility]!! ° ° °Shenandoah Vandergriff, RN °[phone] °  °

## 2020-10-21 ENCOUNTER — Telehealth: Payer: Self-pay | Admitting: Cardiology

## 2020-10-21 NOTE — Telephone Encounter (Signed)
Called patient informed him to let pharmacy know what medications he needs refills on and they will send [place] the request he understood no further questions.

## 2020-10-21 NOTE — Telephone Encounter (Signed)
We had to reschedule his six month appt due to dr k being in the hospital rounding/ he will need his refills sent in   Thank you  [place]

## 2020-10-26 DIAGNOSIS — I420 Dilated cardiomyopathy: Secondary | ICD-10-CM | POA: Diagnosis not present

## 2020-10-26 DIAGNOSIS — F411 Generalized anxiety disorder: Secondary | ICD-10-CM | POA: Diagnosis not present

## 2020-10-26 DIAGNOSIS — I10 Essential (primary) hypertension: Secondary | ICD-10-CM | POA: Diagnosis not present

## 2020-10-26 DIAGNOSIS — K219 Gastro-esophageal reflux disease without esophagitis: Secondary | ICD-10-CM | POA: Diagnosis not present

## 2020-11-02 ENCOUNTER — Ambulatory Visit: Payer: PPO | Admitting: Cardiology

## 2020-11-07 ENCOUNTER — Other Ambulatory Visit: Payer: Self-pay | Admitting: Cardiology

## 2020-11-09 NOTE — Telephone Encounter (Signed)
Atorvastatin 20 mg # 90 x 1 refill sent to pharmacy

## 2020-11-13 ENCOUNTER — Emergency Department (HOSPITAL_COMMUNITY): Payer: PPO

## 2020-11-13 ENCOUNTER — Observation Stay (HOSPITAL_COMMUNITY): Payer: PPO

## 2020-11-13 ENCOUNTER — Encounter (HOSPITAL_COMMUNITY): Payer: Self-pay

## 2020-11-13 ENCOUNTER — Other Ambulatory Visit: Payer: Self-pay

## 2020-11-13 ENCOUNTER — Inpatient Hospital Stay (HOSPITAL_COMMUNITY)
Admission: EM | Admit: 2020-11-13 | Discharge: 2020-11-19 | DRG: 025 | Disposition: A | Discharge status: Home / Self Care | Payer: PPO | Attending: Neurological Surgery | Admitting: Neurological Surgery

## 2020-11-13 DIAGNOSIS — I447 Left bundle-branch block, unspecified: Secondary | ICD-10-CM | POA: Diagnosis not present

## 2020-11-13 DIAGNOSIS — I6389 Other cerebral infarction: Secondary | ICD-10-CM | POA: Diagnosis not present

## 2020-11-13 DIAGNOSIS — R29818 Other symptoms and signs involving the nervous system: Secondary | ICD-10-CM | POA: Diagnosis not present

## 2020-11-13 DIAGNOSIS — R451 Restlessness and agitation: Secondary | ICD-10-CM | POA: Diagnosis present

## 2020-11-13 DIAGNOSIS — Z8572 Personal history of non-Hodgkin lymphomas: Secondary | ICD-10-CM | POA: Diagnosis not present

## 2020-11-13 DIAGNOSIS — I5022 Chronic systolic (congestive) heart failure: Secondary | ICD-10-CM | POA: Diagnosis present

## 2020-11-13 DIAGNOSIS — M47816 Spondylosis without myelopathy or radiculopathy, lumbar region: Secondary | ICD-10-CM | POA: Diagnosis not present

## 2020-11-13 DIAGNOSIS — I493 Ventricular premature depolarization: Secondary | ICD-10-CM | POA: Diagnosis present

## 2020-11-13 DIAGNOSIS — R471 Dysarthria and anarthria: Secondary | ICD-10-CM | POA: Diagnosis present

## 2020-11-13 DIAGNOSIS — I110 Hypertensive heart disease with heart failure: Secondary | ICD-10-CM | POA: Diagnosis present

## 2020-11-13 DIAGNOSIS — R0902 Hypoxemia: Secondary | ICD-10-CM | POA: Diagnosis not present

## 2020-11-13 DIAGNOSIS — I639 Cerebral infarction, unspecified: Secondary | ICD-10-CM

## 2020-11-13 DIAGNOSIS — R000 Tachycardia, unspecified: Secondary | ICD-10-CM | POA: Diagnosis not present

## 2020-11-13 DIAGNOSIS — Z8249 Family history of ischemic heart disease and other diseases of the circulatory system: Secondary | ICD-10-CM

## 2020-11-13 DIAGNOSIS — J984 Other disorders of lung: Secondary | ICD-10-CM | POA: Diagnosis not present

## 2020-11-13 DIAGNOSIS — F419 Anxiety disorder, unspecified: Secondary | ICD-10-CM | POA: Diagnosis present

## 2020-11-13 DIAGNOSIS — Z20822 Contact with and (suspected) exposure to covid-19: Secondary | ICD-10-CM | POA: Diagnosis present

## 2020-11-13 DIAGNOSIS — I2510 Atherosclerotic heart disease of native coronary artery without angina pectoris: Secondary | ICD-10-CM | POA: Diagnosis present

## 2020-11-13 DIAGNOSIS — I10 Essential (primary) hypertension: Secondary | ICD-10-CM | POA: Diagnosis present

## 2020-11-13 DIAGNOSIS — C9110 Chronic lymphocytic leukemia of B-cell type not having achieved remission: Secondary | ICD-10-CM | POA: Diagnosis present

## 2020-11-13 DIAGNOSIS — R569 Unspecified convulsions: Secondary | ICD-10-CM

## 2020-11-13 DIAGNOSIS — R4182 Altered mental status, unspecified: Secondary | ICD-10-CM | POA: Diagnosis not present

## 2020-11-13 DIAGNOSIS — E785 Hyperlipidemia, unspecified: Secondary | ICD-10-CM | POA: Diagnosis present

## 2020-11-13 DIAGNOSIS — K219 Gastro-esophageal reflux disease without esophagitis: Secondary | ICD-10-CM | POA: Diagnosis present

## 2020-11-13 DIAGNOSIS — I6523 Occlusion and stenosis of bilateral carotid arteries: Secondary | ICD-10-CM | POA: Diagnosis not present

## 2020-11-13 DIAGNOSIS — I252 Old myocardial infarction: Secondary | ICD-10-CM

## 2020-11-13 DIAGNOSIS — R4781 Slurred speech: Secondary | ICD-10-CM | POA: Diagnosis not present

## 2020-11-13 DIAGNOSIS — R253 Fasciculation: Secondary | ICD-10-CM | POA: Diagnosis present

## 2020-11-13 DIAGNOSIS — G9389 Other specified disorders of brain: Secondary | ICD-10-CM | POA: Diagnosis not present

## 2020-11-13 DIAGNOSIS — N400 Enlarged prostate without lower urinary tract symptoms: Secondary | ICD-10-CM | POA: Diagnosis present

## 2020-11-13 DIAGNOSIS — R4020 Unspecified coma: Secondary | ICD-10-CM | POA: Diagnosis not present

## 2020-11-13 DIAGNOSIS — K5790 Diverticulosis of intestine, part unspecified, without perforation or abscess without bleeding: Secondary | ICD-10-CM | POA: Diagnosis not present

## 2020-11-13 DIAGNOSIS — I351 Nonrheumatic aortic (valve) insufficiency: Secondary | ICD-10-CM | POA: Diagnosis present

## 2020-11-13 DIAGNOSIS — G936 Cerebral edema: Secondary | ICD-10-CM | POA: Diagnosis present

## 2020-11-13 DIAGNOSIS — Z888 Allergy status to other drugs, medicaments and biological substances status: Secondary | ICD-10-CM

## 2020-11-13 DIAGNOSIS — G4089 Other seizures: Secondary | ICD-10-CM | POA: Diagnosis present

## 2020-11-13 DIAGNOSIS — Z79899 Other long term (current) drug therapy: Secondary | ICD-10-CM

## 2020-11-13 DIAGNOSIS — Z955 Presence of coronary angioplasty implant and graft: Secondary | ICD-10-CM

## 2020-11-13 DIAGNOSIS — C711 Malignant neoplasm of frontal lobe: Secondary | ICD-10-CM | POA: Diagnosis present

## 2020-11-13 DIAGNOSIS — Z7982 Long term (current) use of aspirin: Secondary | ICD-10-CM

## 2020-11-13 DIAGNOSIS — I509 Heart failure, unspecified: Secondary | ICD-10-CM | POA: Diagnosis not present

## 2020-11-13 DIAGNOSIS — R0681 Apnea, not elsewhere classified: Secondary | ICD-10-CM | POA: Diagnosis present

## 2020-11-13 DIAGNOSIS — I255 Ischemic cardiomyopathy: Secondary | ICD-10-CM | POA: Diagnosis present

## 2020-11-13 DIAGNOSIS — Z820 Family history of epilepsy and other diseases of the nervous system: Secondary | ICD-10-CM

## 2020-11-13 DIAGNOSIS — Z823 Family history of stroke: Secondary | ICD-10-CM | POA: Diagnosis not present

## 2020-11-13 DIAGNOSIS — Z85828 Personal history of other malignant neoplasm of skin: Secondary | ICD-10-CM

## 2020-11-13 DIAGNOSIS — D496 Neoplasm of unspecified behavior of brain: Secondary | ICD-10-CM | POA: Diagnosis not present

## 2020-11-13 DIAGNOSIS — J8410 Pulmonary fibrosis, unspecified: Secondary | ICD-10-CM | POA: Diagnosis not present

## 2020-11-13 DIAGNOSIS — M47814 Spondylosis without myelopathy or radiculopathy, thoracic region: Secondary | ICD-10-CM | POA: Diagnosis not present

## 2020-11-13 DIAGNOSIS — Z800 Family history of malignant neoplasm of digestive organs: Secondary | ICD-10-CM

## 2020-11-13 LAB — I-STAT CHEM 8, ED
BUN: 18 mg/dL (ref 8–23)
Calcium, Ion: 1.19 mmol/L (ref 1.15–1.40)
Chloride: 102 mmol/L (ref 98–111)
Creatinine, Ser: 1 mg/dL (ref 0.61–1.24)
Glucose, Bld: 121 mg/dL — ABNORMAL HIGH (ref 70–99)
HCT: 41 % (ref 39.0–52.0)
Hemoglobin: 13.9 g/dL (ref 13.0–17.0)
Potassium: 3.8 mmol/L (ref 3.5–5.1)
Sodium: 139 mmol/L (ref 135–145)
TCO2: 27 mmol/L (ref 22–32)

## 2020-11-13 LAB — RAPID URINE DRUG SCREEN, HOSP PERFORMED
Amphetamines: NOT DETECTED
Barbiturates: NOT DETECTED
Benzodiazepines: POSITIVE — AB
Cocaine: NOT DETECTED
Opiates: NOT DETECTED
Tetrahydrocannabinol: NOT DETECTED

## 2020-11-13 LAB — CBC
HCT: 42.8 % (ref 39.0–52.0)
Hemoglobin: 14 g/dL (ref 13.0–17.0)
MCH: 32.3 pg (ref 26.0–34.0)
MCHC: 32.7 g/dL (ref 30.0–36.0)
MCV: 98.6 fL (ref 80.0–100.0)
Platelets: 185 10*3/uL (ref 150–400)
RBC: 4.34 MIL/uL (ref 4.22–5.81)
RDW: 12 % (ref 11.5–15.5)
WBC: 11.8 10*3/uL — ABNORMAL HIGH (ref 4.0–10.5)
nRBC: 0.2 % (ref 0.0–0.2)

## 2020-11-13 LAB — GLUCOSE, CAPILLARY
Glucose-Capillary: 115 mg/dL — ABNORMAL HIGH (ref 70–99)
Glucose-Capillary: 108 mg/dL — ABNORMAL HIGH (ref 70–99)

## 2020-11-13 LAB — COMPREHENSIVE METABOLIC PANEL
ALT: 19 U/L (ref 0–44)
AST: 27 U/L (ref 15–41)
Albumin: 3.5 g/dL (ref 3.5–5.0)
Alkaline Phosphatase: 68 U/L (ref 38–126)
Anion gap: 5 (ref 5–15)
BUN: 16 mg/dL (ref 8–23)
CO2: 28 mmol/L (ref 22–32)
Calcium: 9 mg/dL (ref 8.9–10.3)
Chloride: 104 mmol/L (ref 98–111)
Creatinine, Ser: 1.1 mg/dL (ref 0.61–1.24)
GFR, Estimated: >60 mL/min (ref >60)
Glucose, Bld: 123 mg/dL — ABNORMAL HIGH (ref 70–99)
Potassium: 4 mmol/L (ref 3.5–5.1)
Sodium: 137 mmol/L (ref 135–145)
Total Bilirubin: 1.2 mg/dL (ref 0.3–1.2)
Total Protein: 5.6 g/dL — ABNORMAL LOW (ref 6.5–8.1)

## 2020-11-13 LAB — LIPID PANEL
Cholesterol: 99 mg/dL (ref 0–200)
HDL: 40 mg/dL — ABNORMAL LOW (ref >40)
LDL Cholesterol: 50 mg/dL (ref 0–99)
Total CHOL/HDL Ratio: 2.5 RATIO
Triglycerides: 46 mg/dL (ref <150)
VLDL: 9 mg/dL (ref 0–40)

## 2020-11-13 LAB — ECHOCARDIOGRAM COMPLETE
Area-P 1/2: 2.71 cm2
Height: 66 in
P 1/2 time: 481 msec
S' Lateral: 2.9 cm
Weight: 2744.29 oz

## 2020-11-13 LAB — DIFFERENTIAL
Abs Immature Granulocytes: 0.02 10*3/uL (ref 0.00–0.07)
Basophils Absolute: 0 10*3/uL (ref 0.0–0.1)
Basophils Relative: 0 %
Eosinophils Absolute: 0.3 10*3/uL (ref 0.0–0.5)
Eosinophils Relative: 2 %
Immature Granulocytes: 0 %
Lymphocytes Relative: 53 %
Lymphs Abs: 6.2 10*3/uL — ABNORMAL HIGH (ref 0.7–4.0)
Monocytes Absolute: 0.8 10*3/uL (ref 0.1–1.0)
Monocytes Relative: 7 %
Neutro Abs: 4.5 10*3/uL (ref 1.7–7.7)
Neutrophils Relative %: 38 %

## 2020-11-13 LAB — URINALYSIS, ROUTINE W REFLEX MICROSCOPIC
Bilirubin Urine: NEGATIVE
Glucose, UA: NEGATIVE mg/dL
Hgb urine dipstick: NEGATIVE
Ketones, ur: NEGATIVE mg/dL
Leukocytes,Ua: NEGATIVE
Nitrite: NEGATIVE
Protein, ur: NEGATIVE mg/dL
Specific Gravity, Urine: >1.046 — ABNORMAL HIGH (ref 1.005–1.030)
pH: 7 (ref 5.0–8.0)

## 2020-11-13 LAB — APTT: aPTT: 29 seconds (ref 24–36)

## 2020-11-13 LAB — RESP PANEL BY RT-PCR (FLU A&B, COVID) ARPGX2
Influenza A by PCR: NEGATIVE
Influenza B by PCR: NEGATIVE
SARS Coronavirus 2 by RT PCR: NEGATIVE

## 2020-11-13 LAB — PROTIME-INR
INR: 1.1 (ref 0.8–1.2)
Prothrombin Time: 13.8 seconds (ref 11.4–15.2)

## 2020-11-13 LAB — CBG MONITORING, ED: Glucose-Capillary: 113 mg/dL — ABNORMAL HIGH (ref 70–99)

## 2020-11-13 LAB — ETHANOL: Alcohol, Ethyl (B): <10 mg/dL (ref <10)

## 2020-11-13 MED ORDER — LEVETIRACETAM IN NACL 1000 MG/100ML IV SOLN
1000.0000 mg | Freq: Once | INTRAVENOUS | Status: AC
  Administered 2020-11-13: 1000 mg via INTRAVENOUS

## 2020-11-13 MED ORDER — CARVEDILOL 12.5 MG PO TABS
25.0000 mg | ORAL_TABLET | Freq: Two times a day (BID) | ORAL | Status: DC
  Administered 2020-11-13: 25 mg via ORAL
  Filled 2020-11-13: qty 2

## 2020-11-13 MED ORDER — SENNOSIDES-DOCUSATE SODIUM 8.6-50 MG PO TABS: 1.0000 | ORAL_TABLET | Freq: Every evening | ORAL | Status: DC | PRN

## 2020-11-13 MED ORDER — ATORVASTATIN CALCIUM 10 MG PO TABS
20.0000 mg | ORAL_TABLET | Freq: Every day | ORAL | Status: DC
  Administered 2020-11-14 – 2020-11-19 (×5): 20 mg via ORAL
  Filled 2020-11-13 (×5): qty 2

## 2020-11-13 MED ORDER — LORAZEPAM 2 MG/ML IJ SOLN
0.5000 mg | Freq: Once | INTRAMUSCULAR | Status: AC
  Administered 2020-11-13: 0.5 mg via INTRAVENOUS
  Filled 2020-11-13: qty 1

## 2020-11-13 MED ORDER — ASPIRIN 81 MG PO CHEW
81.0000 mg | CHEWABLE_TABLET | Freq: Every day | ORAL | Status: DC
  Administered 2020-11-14 – 2020-11-15 (×2): 81 mg via ORAL
  Filled 2020-11-13 (×2): qty 1

## 2020-11-13 MED ORDER — ENOXAPARIN SODIUM 40 MG/0.4ML IJ SOSY
40.0000 mg | PREFILLED_SYRINGE | INTRAMUSCULAR | Status: DC
  Administered 2020-11-14: 40 mg via SUBCUTANEOUS
  Filled 2020-11-13: qty 0.4

## 2020-11-13 MED ORDER — PANTOPRAZOLE SODIUM 40 MG PO TBEC
40.0000 mg | DELAYED_RELEASE_TABLET | Freq: Every day | ORAL | Status: DC
  Administered 2020-11-14 – 2020-11-19 (×5): 40 mg via ORAL
  Filled 2020-11-13 (×5): qty 1

## 2020-11-13 MED ORDER — MEXILETINE HCL 150 MG PO CAPS
150.0000 mg | ORAL_CAPSULE | Freq: Two times a day (BID) | ORAL | Status: DC
  Administered 2020-11-13 – 2020-11-19 (×11): 150 mg via ORAL
  Filled 2020-11-13 (×16): qty 1

## 2020-11-13 MED ORDER — ACETAMINOPHEN 160 MG/5ML PO SOLN: 650.0000 mg | ORAL | Status: DC | PRN

## 2020-11-13 MED ORDER — STROKE: EARLY STAGES OF RECOVERY BOOK
Freq: Once | Status: AC
  Filled 2020-11-13: qty 1

## 2020-11-13 MED ORDER — IOHEXOL 350 MG/ML SOLN
75.0000 mL | Freq: Once | INTRAVENOUS | Status: AC | PRN
  Administered 2020-11-13: 75 mL via INTRAVENOUS

## 2020-11-13 MED ORDER — ASPIRIN 325 MG PO TABS
325.0000 mg | ORAL_TABLET | Freq: Once | ORAL | Status: AC
  Administered 2020-11-13: 325 mg via ORAL
  Filled 2020-11-13: qty 1

## 2020-11-13 MED ORDER — ACETAMINOPHEN 325 MG PO TABS
650.0000 mg | ORAL_TABLET | ORAL | Status: DC | PRN
  Administered 2020-11-17 – 2020-11-19 (×5): 650 mg via ORAL
  Filled 2020-11-13 (×5): qty 2

## 2020-11-13 MED ORDER — BUSPIRONE HCL 10 MG PO TABS: 10.0000 mg | ORAL_TABLET | Freq: Two times a day (BID) | ORAL | Status: DC | PRN

## 2020-11-13 MED ORDER — LEVETIRACETAM 500 MG PO TABS
500.0000 mg | ORAL_TABLET | Freq: Two times a day (BID) | ORAL | Status: DC
  Administered 2020-11-13 – 2020-11-19 (×11): 500 mg via ORAL
  Filled 2020-11-13 (×11): qty 1

## 2020-11-13 MED ORDER — ACETAMINOPHEN 650 MG RE SUPP: 650.0000 mg | RECTAL | Status: DC | PRN

## 2020-11-13 NOTE — Progress Notes (Signed)
EEG complete - results pending 

## 2020-11-13 NOTE — ED Notes (Signed)
Patient called EMS for difficulty speaking by patient, en route patient had facial twitching and seizure like activity, denies hx of same.

## 2020-11-13 NOTE — Evaluation (Signed)
Physical Therapy Evaluation Patient Details Name: Nathan Hall MRN: [id number] DOB: May 29, 1935 Today's Date: 11/13/2020   History of Present Illness  86 y/o male presented to ED on 6/17 with difficulty speaking. En route, patient with facial twitching and seizure like activity. EEG (-) for seizure activity. CT head (-) for acute abnormalities. MRI pending. PMH: CAD s/p stent placement, CHF, CLL, HTN, HLD, GERD  Clinical Impression  PTA, patient lives with wife and reports independence with mobility, enjoys gardening and mowing. Patient participates in cardiac rehab 5x/week since 2000. Patient currently functioning at independent level with no AD. Patient functioning at baseline. No further skilled PT needs required during acute stay. No PT follow up recommended at this time.     Follow Up Recommendations No PT follow up    Equipment Recommendations  None recommended by PT    Recommendations for Other Services       Precautions / Restrictions Precautions Precautions: None Restrictions Weight Bearing Restrictions: No      Mobility  Bed Mobility               General bed mobility comments: sitting EOB on arrival    Transfers Overall transfer level: Independent Equipment used: None                Ambulation/Gait Ambulation/Gait assistance: Independent Gait Distance (Feet): 250 Feet Assistive device: None Gait Pattern/deviations: Step-through pattern;Decreased stance time - left;Decreased stance time - right;Drifts right/left   Gait velocity interpretation: >4.37 ft/sec, indicative of normal walking speed General Gait Details: patient reports baseline ambulation with drifting L/R  Stairs Stairs: Yes Stairs assistance: Independent Stair Management: One rail Right;Alternating pattern;Forwards Number of Stairs: 2    Wheelchair Mobility    Modified Rankin (Stroke Patients Only) Modified Rankin (Stroke Patients Only) Pre-Morbid Rankin Score: No  symptoms Modified Rankin: No symptoms     Balance Overall balance assessment: No apparent balance deficits (not formally assessed)                                           Pertinent Vitals/Pain Pain Assessment: No/denies pain    Home Living Family/patient expects to be discharged to:: Private residence Living Arrangements: Spouse/significant other Available Help at Discharge: Family;Available 24 hours/day Type of Home: House Home Access: Stairs to enter Entrance Stairs-Rails: Right Entrance Stairs-Number of Steps: 2 Home Layout: One level Home Equipment: None      Prior Function Level of Independence: Independent         Comments: enjoys gardening, mowing. Attends cardiac rehab 5x/week     Hand Dominance        Extremity/Trunk Assessment   Upper Extremity Assessment Upper Extremity Assessment: Overall WFL for tasks assessed    Lower Extremity Assessment Lower Extremity Assessment: Overall WFL for tasks assessed    Cervical / Trunk Assessment Cervical / Trunk Assessment: Normal  Communication   Communication: No difficulties  Cognition Arousal/Alertness: Awake/alert Behavior During Therapy: WFL for tasks assessed/performed Overall Cognitive Status: Within Functional Limits for tasks assessed                                        General Comments      Exercises     Assessment/Plan    PT Assessment Patent does not need any further PT  services  PT Problem List         PT Treatment Interventions      PT Goals (Current goals can be found in the Care Plan section)  Acute Rehab PT Goals Patient Stated Goal: to go home PT Goal Formulation: All assessment and education complete, DC therapy    Frequency     Barriers to discharge        Co-evaluation               AM-PAC PT "6 Clicks" Mobility  Outcome Measure Help needed turning from your back to your side while in a flat bed without using bedrails?:  None Help needed moving from lying on your back to sitting on the side of a flat bed without using bedrails?: None Help needed moving to and from a bed to a chair (including a wheelchair)?: None Help needed standing up from a chair using your arms (e.g., wheelchair or bedside chair)?: None Help needed to walk in hospital room?: None Help needed climbing 3-5 steps with a railing? : None 6 Click Score: 24    End of Session Equipment Utilized During Treatment: Gait belt Activity Tolerance: Patient tolerated treatment well Patient left: in chair;with call bell/phone within reach;with family/visitor present Nurse Communication: Mobility status PT Visit Diagnosis: Unsteadiness on feet (R26.81)    Time: 6440-3474 PT Time Calculation (min) (ACUTE ONLY): 16 min   Charges:   PT Evaluation $PT Eval Low Complexity: 1 Low          Nathan Hall A. Nathan Hall PT, DPT Acute Rehabilitation Services Pager [phone] Office [phone]   Nathan Hall 11/13/2020, 3:35 PM

## 2020-11-13 NOTE — ED Notes (Signed)
Report attempted x2

## 2020-11-13 NOTE — Evaluation (Signed)
Occupational Therapy Evaluation Patient Details Name: Nathan Hall MRN: [id number] DOB: 01-16-1935 Today's Date: 11/13/2020    History of Present Illness 86 y/o male presented to ED on 6/17 with difficulty speaking. En route, patient with facial twitching and seizure like activity. EEG (-) for seizure activity. CT head (-) for acute abnormalities. MRI pending. PMH: CAD s/p stent placement, CHF, CLL, HTN, HLD, GERD   Clinical Impression   Patient admitted for the diagnosis above.  Patient and spouse endorse a resolution of all symptoms.  He is currently functioning at his baseline for in room mobility/toileting and ADL performance.  His wife lives with him, and can provide assist as needed.  He did have mild problem solving deficits, but nothing that appeared out of the ordinary, and does not impact function.  No acute OT needs identified, and no post acute OT needs exist.  He goes to cardiac rehab 4x/wk.  OT to sign off.      Follow Up Recommendations  No OT follow up    Equipment Recommendations  None recommended by OT    Recommendations for Other Services       Precautions / Restrictions Precautions Precautions: None Restrictions Weight Bearing Restrictions: No      Mobility Bed Mobility               General bed mobility comments: sitting in chair Patient Response: Cooperative  Transfers Overall transfer level: Independent Equipment used: None                  Balance Overall balance assessment: Mild deficits observed, not formally tested                                         ADL either performed or assessed with clinical judgement   ADL Overall ADL's : At baseline                                       General ADL Comments: wife assists as needed.     Vision Baseline Vision/History: Wears glasses Wears Glasses: At all times Patient Visual Report: No change from baseline Additional Comments: glasses are not  at the hospital     Perception     Praxis      Pertinent Vitals/Pain Pain Assessment: No/denies pain     Hand Dominance Right   Extremity/Trunk Assessment Upper Extremity Assessment Upper Extremity Assessment: Overall WFL for tasks assessed   Lower Extremity Assessment Lower Extremity Assessment: Defer to PT evaluation   Cervical / Trunk Assessment Cervical / Trunk Assessment: Normal   Communication Communication Communication: No difficulties   Cognition Arousal/Alertness: Awake/alert Behavior During Therapy: WFL for tasks assessed/performed Overall Cognitive Status: Within Functional Limits for tasks assessed                                 General Comments: patient completed clock drawing without difficulty.   General Comments       Exercises     Shoulder Instructions      Home Living Family/patient expects to be discharged to:: Private residence Living Arrangements: Spouse/significant other Available Help at Discharge: Family;Available 24 hours/day Type of Home: House Home Access: Stairs to enter [employer] of Steps: 2 Entrance  Stairs-Rails: Right Home Layout: One level     Bathroom Shower/Tub: Occupational psychologist: Handicapped height Bathroom Accessibility: Yes How Accessible: Accessible via walker Home Equipment: None          Prior Functioning/Environment Level of Independence: Independent                OT Problem List: Impaired balance (sitting and/or standing)      OT Treatment/Interventions:      OT Goals(Current goals can be found in the care plan section) Acute Rehab OT Goals Patient Stated Goal: Return home OT Goal Formulation: With patient Time For Goal Achievement: 11/13/20 Potential to Achieve Goals: Good  OT Frequency:     Barriers to D/C:  None noted          Co-evaluation              AM-PAC OT "6 Clicks" Daily Activity     Outcome Measure Help from another person  eating meals?: None Help from another person taking care of personal grooming?: None Help from another person toileting, which includes using toliet, bedpan, or urinal?: None Help from another person bathing (including washing, rinsing, drying)?: None Help from another person to put on and taking off regular upper body clothing?: None Help from another person to put on and taking off regular lower body clothing?: None 6 Click Score: 24   End of Session Nurse Communication: Mobility status  Activity Tolerance: Patient tolerated treatment well Patient left: in bed;with call bell/phone within reach;with family/visitor present  OT Visit Diagnosis: Unsteadiness on feet (R26.81);Other (comment) (slurred speech)                Time: 1610-9604 OT Time Calculation (min): 24 min Charges:  OT General Charges $OT Visit: 1 Visit OT Evaluation $OT Eval Moderate Complexity: 1 Mod OT Treatments $Self Care/Home Management : 8-22 mins  11/13/2020  Rich, OTR/L  Acute Rehabilitation Services  Office:  [place] 11/13/2020, 3:51 PM

## 2020-11-13 NOTE — ED Provider Notes (Signed)
Patient care assumed at 0700.  Pt as a code stroke for garbled speech.  Had seizure activity and was treated with versed prior to ED arrival.  On ED arrival pt with apnea, which quickly improved.  Pt was loaded with keppra.  Imaging and labs pending.    Discussed with Neurology - It is recommended that patient is admitted for ongoing workup. Medicine consulted for admission.    Quintella Reichert, MD 11/13/20 1534

## 2020-11-13 NOTE — Code Documentation (Signed)
Responded to Code Stroke called at 0636 for slurred speech and facial twitching, [id number] last night. CBG-113, NIH-1, CT head negative, CTA-no LVO. Plan stroke workup and tx for seizures.

## 2020-11-13 NOTE — ED Provider Notes (Signed)
[facility] EMERGENCY DEPARTMENT Provider Note   CSN: [id number] Arrival date & time: 11/13/20  0631     History Chief Complaint  Patient presents with   Code Stroke    Nathan Hall is a 86 y.o. male.  Level 5 caveat for acuity of condition.  Patient brought in by EMS with possible stroke versus seizure activity.  He was last seen normal at 9 PM.  He woke this morning with difficulty speaking and seizure-like activity involving his left face and eye with twitching.  This happened about 2 or 3 different times lasting for 30 to 45 seconds each.  After the last episode EMS gave him 2.5 mg of Versed and he is now apneic requiring BVM ventilation. No reported history of seizures.  No reported history of trauma or fall. EMS reports a history of lymphoma but not currently getting chemotherapy. On arrival patient did start to wake up and his preparations were being made for intubation.  No evidence of tongue biting or incontinence.  No evidence of trauma.  Denies headache, chest pain or shortness of breath.  He is moving all extremities and following some commands. Is appear to have garbled speech.  Family in route. Suspect his apnea secondary to Versed and will hold on intubation at this time  The history is provided by the patient and the EMS personnel. The history is limited by the condition of the patient.      Past Medical History:  Diagnosis Date   Abnormal nuclear stress test 10/07/2016   Aftercare following surgery 02/22/2018   Cancer ([place])    Cardiomyopathy ([place]) 12/09/2014   Overview:  Ejection fraction 30% May 2018   Cataract    were removed   Cataract    were removed   CHF (congestive heart failure) ([place])    CLL (chronic lymphocytic leukemia) ([place])    Coronary artery disease    Coronary artery disease involving native coronary artery of native heart without angina pectoris 12/09/2014   Formatting of this note might be different from the original. LAD and  Cx stents 2018 Formatting of this note might be different from the original. PTCA and drug-eluting stent to left anterior descending artery and circumflex artery in May 2018   Dyslipidemia 12/09/2014   Essential hypertension 12/09/2014   GERD (gastroesophageal reflux disease)    Hyperlipidemia    Hypertension    Ingrown nail 02/15/2018   Intermittent lightheadedness 10/13/2016   LV dysfunction 04/11/2017   Pulmonary infiltrates on CXR 01/26/2017   Onset symptoms was late June 2018 and CT scan 12/05/16 c/w as dz LLL/ clear on f/u 01/26/2017  -        PVC's (premature ventricular contractions) 10/07/2016   Rhinitis, chronic 01/27/2017   Allergy profile 01/26/2017 >  Eos 0.1 /  IgE  53 RAST pos dust only     Patient Active Problem List   Diagnosis Date Noted   Hypertension    Hyperlipidemia    GERD (gastroesophageal reflux disease)    CLL (chronic lymphocytic leukemia) (H[facility])    CHF (congestive heart failure) ([place])    Cataract    Cancer ([place])    Aftercare following surgery 02/22/2018   Ingrown nail 02/15/2018   LV dysfunction 04/11/2017   Rhinitis, chronic 01/27/2017   Pulmonary infiltrates on CXR 01/26/2017   Intermittent lightheadedness 10/13/2016   Coronary artery disease    Abnormal nuclear stress test 10/07/2016   PVC's (premature ventricular contractions) 10/07/2016   Cardiomyopathy ([place])  12/09/2014   Dyslipidemia 12/09/2014   Essential hypertension 12/09/2014   Coronary artery disease involving native coronary artery of native heart without angina pectoris 12/09/2014    Past Surgical History:  Procedure Laterality Date   APPENDECTOMY     BASAL CELL CARCINOMA EXCISION  10/31/2019   cardiac stents     EYE SURGERY     HERNIA REPAIR         Family History  Problem Relation Age of Onset   Pneumonia Mother    Stomach cancer Father    Congestive Heart Failure Father    Heart attack Maternal Grandmother    Alzheimer's disease Maternal Grandfather    Cancer Paternal  Grandmother    Heart Problems Sister     Social History   Tobacco Use   Smoking status: Never   Smokeless tobacco: Never   Tobacco comments:    States years ago/did not inhale/brief period/can't remeber dates  Vaping Use   Vaping Use: Never used  Substance Use Topics   Alcohol use: No   Drug use: No    Home Medications Prior to Admission medications   Medication Sig Start Date End Date Taking? Authorizing Provider  aspirin EC 81 MG tablet Take 81 mg by mouth daily.    [provider]  atorvastatin (LIPITOR) 20 MG tablet TAKE 1 TABLET BY MOUTH EVERY DAY 11/09/20   Park Liter, MD  busPIRone (BUSPAR) 10 MG tablet Take 10 mg by mouth 3 (three) times daily as needed.     [provider]  carvedilol (COREG) 25 MG tablet TAKE 1 TABLET (25 MG TOTAL) BY MOUTH 2 (TWO) TIMES DAILY WITH A MEAL. 06/24/20   Park Liter, MD  Cholecalciferol (VITAMIN D3) 2000 units capsule Take 1 capsule by mouth daily.    [provider]  CO ENZYME Q-10 PO Take 200 mg by mouth daily.    [provider]  FLUAD QUADRIVALENT 0.5 ML injection Inject 0.5 mLs into the muscle as needed. Once a year 03/11/20   [provider]  fluticasone (FLONASE) 50 MCG/ACT nasal spray Place 2 sprays into both nostrils daily. 06/03/19   [provider]  furosemide (LASIX) 40 MG tablet Take 40 mg by mouth daily as needed for edema.    [provider]  mexiletine (MEXITIL) 150 MG capsule TAKE 1 CAPSULE BY MOUTH TWICE A DAY 06/17/20   Camnitz, Will Hassell Done, MD  multivitamin-lutein [facility]) CAPS capsule Take 1 capsule by mouth daily.    [provider]  nitroGLYCERIN (NITROSTAT) 0.4 MG SL tablet Place 1 tablet under the tongue as needed for chest pain. 10/08/16   [provider]  Omega-3 Fatty Acids (FISH OIL) 1000 MG CAPS Take by mouth.    [provider]  pantoprazole (PROTONIX) 40 MG tablet Take 40 mg by mouth daily.    [provider]  potassium chloride SA (K-DUR,KLOR-CON) 20 MEQ tablet Take 20 mEq by mouth daily.    [provider]  spironolactone (ALDACTONE) 25 MG tablet Take 12.5 mg by mouth daily. 11/10/16   [provider]    Allergies    Lisinopril, Niaspan [niacin er], and Simvastatin  Review of Systems   Review of Systems  Unable to perform ROS: Acuity of condition   Physical Exam Updated Vital Signs BP 121/63   Pulse 67   Temp 97.6 F (36.4 C) (Temporal)   Resp 19   Ht 5\' 6"  (1.676 m)   Wt 77.8 kg  SpO2 99%   BMI 27.68 kg/m   Physical Exam Vitals and nursing note reviewed.  Constitutional:      General: He is not in acute distress.    Appearance: He is well-developed.     Comments: Initially unresponsive with BVM in place.  Patient became more awake and was breathing on his own.  O2 saturation 100%.  Moving all extremities equally. Oriented to person and place  HENT:     Head: Normocephalic and atraumatic.     Mouth/Throat:     Pharynx: No oropharyngeal exudate.  Eyes:     Conjunctiva/sclera: Conjunctivae normal.     Pupils: Pupils are equal, round, and reactive to light.  Neck:     Comments: No meningismus. Cardiovascular:     Rate and Rhythm: Normal rate and regular rhythm.     Heart sounds: Normal heart sounds. No murmur heard. Pulmonary:     Effort: Pulmonary effort is normal. No respiratory distress.     Breath sounds: Normal breath sounds.  Abdominal:     Palpations: Abdomen is soft.     Tenderness: There is no abdominal tenderness. There is no guarding or rebound.  Musculoskeletal:        General: No tenderness. Normal range of motion.     Cervical back: Normal range of motion and neck supple.  Skin:    General: Skin is warm.  Neurological:     Mental Status: He is alert and oriented to person, place, and time.     Cranial Nerves: No cranial nerve deficit.     Motor: No abnormal muscle tone.     Coordination: Coordination normal.      Comments: EMS was a video on the phone showing twitching of the left side of his face. On arrival there is no seizure-like activity.  He has no facial droop.  Does appear to have garbled speech and some dysarthria. Able to raise arms and legs off the bed without difficulty.  Follows commands and moves extremities to command.  Psychiatric:        Behavior: Behavior normal.    ED Results / Procedures / Treatments   Labs (all labs ordered are listed, but only abnormal results are displayed) Labs Reviewed  CBC - Abnormal; Notable for the following components:      Result Value   WBC 11.8 (*)    All other components within normal limits  DIFFERENTIAL - Abnormal; Notable for the following components:   Lymphs Abs 6.2 (*)    All other components within normal limits  COMPREHENSIVE METABOLIC PANEL - Abnormal; Notable for the following components:   Glucose, Bld 123 (*)    Total Protein 5.6 (*)    All other components within normal limits  CBG MONITORING, ED - Abnormal; Notable for the following components:   Glucose-Capillary 113 (*)    All other components within normal limits  I-STAT CHEM 8, ED - Abnormal; Notable for the following components:   Glucose, Bld 121 (*)    All other components within normal limits  RESP PANEL BY RT-PCR (FLU A&B, COVID) ARPGX2  ETHANOL  PROTIME-INR  APTT  RAPID URINE DRUG SCREEN, HOSP PERFORMED  URINALYSIS, ROUTINE W REFLEX MICROSCOPIC  I-STAT CHEM 8, ED    EKG EKG Interpretation  Date/Time:  Friday November 13 2020 06:36:23 EDT Ventricular Rate:  70 PR Interval:  201 QRS Duration: 141 QT Interval:  427 QTC Calculation: 461 R Axis:   -54 Text Interpretation: Sinus rhythm Left bundle branch  block new LBBB Confirmed by Ezequiel Essex [phone]) on 11/13/2020 6:45:00 AM  Radiology DG Chest Portable 1 View  Result Date: 11/13/2020 CLINICAL DATA:  86 year old male with altered mental status. Code stroke. EXAM: PORTABLE CHEST 1 VIEW COMPARISON:  Chest  radiographs 01/26/2017 and earlier. FINDINGS: Portable AP semi upright view at 0717 hours. Chronically low lung volumes with stable elevation or eventration of the left hemidiaphragm. Normal cardiac size and mediastinal contours. Visualized tracheal air column is within normal limits. Allowing for portable technique the lungs are clear. No pneumothorax. No acute osseous abnormality identified. IMPRESSION: Stable chronically low lung volumes with elevation/eventration of the diaphragm. No acute cardiopulmonary abnormality. Electronically Signed   By: Genevie Ann M.D.   On: 11/13/2020 07:44   CT HEAD CODE STROKE WO CONTRAST  Result Date: 11/13/2020 CLINICAL DATA:  Code stroke. 86 year old male with neurologic deficit. Dysarthria. EXAM: CT HEAD WITHOUT CONTRAST TECHNIQUE: Contiguous axial images were obtained from the base of the skull through the vertex without intravenous contrast. COMPARISON:  10/13/2016 head CT report (no images available). FINDINGS: Brain: No midline shift, mass effect, or evidence of intracranial mass lesion. No acute intracranial hemorrhage identified. No ventriculomegaly. Confluent right greater than left white matter hypodensity, involving the right frontal operculum. Mild asymmetric heterogeneity also in the right basal ganglia. Furthermore, there is possible mild cortical cytotoxic edema at the frontal operculum on coronal image 50, series 3, image 19. But no other changes of acute cortically based infarct are identified. No chronic cortical encephalomalacia identified. Vascular: Mild Calcified atherosclerosis at the skull base. No suspicious intracranial vascular hyperdensity. Skull: No acute osseous abnormality identified. Sinuses/Orbits: Maxillary mucoperiosteal thickening and subtotal opacification of the other paranasal sinuses. Periosteal thickening also in the sphenoid and frontal sinuses. Tympanic cavities and mastoids are clear. Other: Postoperative changes to both globes. No acute  orbit or scalp soft tissue finding. ASPECTS [facility] Stroke Program Early CT Score) Total score (0-10 with 10 being normal): 9 (abnormal right M 5 segment). IMPRESSION: 1. Advanced bilateral white matter disease with questionable acute cytotoxic edema in the right frontal operculum (righ M5 segment). ASPECTS 9. No acute hemorrhage, mass effect, or hyperdense vessel. 2. These results were communicated to Dr. Theda Sers at 7:00 am on 11/13/2020 by text page via the [facility] messaging system. Electronically Signed   By: Genevie Ann M.D.   On: 11/13/2020 07:01    Procedures .Critical Care  Date/Time: 11/13/2020 7:49 AM Performed by: Ezequiel Essex, MD Authorized by: Ezequiel Essex, MD   Critical care provider statement:    Critical care time (minutes):  35   Critical care was necessary to treat or prevent imminent or life-threatening deterioration of the following conditions:  CNS failure or compromise and respiratory failure   Critical care was time spent personally by me on the following activities:  Discussions with consultants, evaluation of patient's response to treatment, examination of patient, ordering and performing treatments and interventions, ordering and review of laboratory studies, ordering and review of radiographic studies, pulse oximetry, re-evaluation of patient's condition, obtaining history from patient or surrogate and review of old charts   Medications Ordered in ED Medications  levETIRAcetam (KEPPRA) IVPB 1000 mg/100 mL premix (has no administration in time range)    ED Course  I have reviewed the triage vital signs and the nursing notes.  Pertinent labs & imaging results that were available during my care of the patient were reviewed by me and considered in my medical decision making (see chart for details).  MDM Rules/Calculators/A&P                         Seizure-like activity with possible stroke.  Code stroke activated on arrival.  Dr. Theda Sers at bedside.  Preparation  for intubation held as patient is starting to wake up and follow commands.  He was loaded with IV Keppra.  Initial CT is negative for hemorrhage.  There are some questionable area of cytotoxic edema in the right frontal lobe. CT angiogram is pending.  Labs are pending.  Patient awake and alert and maintaining airway and breathing comfortably on his own without hypoxia.  We will hold on intubation at this time. IV Keppra loading dose given. Neurology consult pending.   Dr. Ralene Bathe to assume care at shift change.  Will need admission. Final Clinical Impression(s) / ED Diagnoses Final diagnoses:  None    Rx / DC Orders ED Discharge Orders     None        Jaesean Litzau, Annie Main, MD 11/13/20 [phone]

## 2020-11-13 NOTE — ED Triage Notes (Signed)
Patient arrived from home with stroke like symptoms, LKN 2130, ? Seizure like activity for EMS, 2.5 mg Versed given with last episode.

## 2020-11-13 NOTE — Evaluation (Signed)
Speech Language Pathology Evaluation Patient Details Name: Nathan Hall MRN: [id number] DOB: 02/18/1935 Today's Date: 11/13/2020 Time: 6644-0347 SLP Time Calculation (min) (ACUTE ONLY): 14.67 min  Problem List:  Patient Active Problem List   Diagnosis Date Noted   CVA (cerebral vascular accident) ([place]) 11/13/2020   Seizure ([place]) 11/13/2020   Hypertension    Hyperlipidemia    GERD (gastroesophageal reflux disease)    CLL (chronic lymphocytic leukemia) (H[facility])    CHF (congestive heart failure) ([place])    Cataract    Cancer ([place])    Aftercare following surgery 02/22/2018   Ingrown nail 02/15/2018   LV dysfunction 04/11/2017   Rhinitis, chronic 01/27/2017   Pulmonary infiltrates on CXR 01/26/2017   Intermittent lightheadedness 10/13/2016   Coronary artery disease    Abnormal nuclear stress test 10/07/2016   PVC's (premature ventricular contractions) 10/07/2016   Cardiomyopathy ([place]) 12/09/2014   Dyslipidemia 12/09/2014   Essential hypertension 12/09/2014   Coronary artery disease involving native coronary artery of native heart without angina pectoris 12/09/2014   Past Medical History:  Past Medical History:  Diagnosis Date   Abnormal nuclear stress test 10/07/2016   Aftercare following surgery 02/22/2018   Cancer ([place])    Cardiomyopathy ([place]) 12/09/2014   Overview:  Ejection fraction 30% May 2018   Cataract    were removed   Cataract    were removed   CHF (congestive heart failure) (H[facility])    CLL (chronic lymphocytic leukemia) ([place])    Coronary artery disease    Coronary artery disease involving native coronary artery of native heart without angina pectoris 12/09/2014   Formatting of this note might be different from the original. LAD and Cx stents 2018 Formatting of this note might be different from the original. PTCA and drug-eluting stent to left anterior descending artery and circumflex artery in May 2018   Dyslipidemia 12/09/2014   Essential hypertension 12/09/2014   GERD  (gastroesophageal reflux disease)    Hyperlipidemia    Hypertension    Ingrown nail 02/15/2018   Intermittent lightheadedness 10/13/2016   LV dysfunction 04/11/2017   Pulmonary infiltrates on CXR 01/26/2017   Onset symptoms was late June 2018 and CT scan 12/05/16 c/w as dz LLL/ clear on f/u 01/26/2017  -        PVC's (premature ventricular contractions) 10/07/2016   Rhinitis, chronic 01/27/2017   Allergy profile 01/26/2017 >  Eos 0.1 /  IgE  53 RAST pos dust only    Past Surgical History:  Past Surgical History:  Procedure Laterality Date   APPENDECTOMY     BASAL CELL CARCINOMA EXCISION  10/31/2019   cardiac stents     EYE SURGERY     HERNIA REPAIR     HPI:  Pt is an 86 yo male who presented to the ED with slurred speech. En route, patient with facial twitching and seizure-like activity. CT head: Advanced bilateral white matter disease with questionable acute cytotoxic edema in the right frontal operculum. CTA negative for LVO. EEG WNL. PMH of CAD s/p circumflex / LAD stents, chronic systolic heart failure (EF 40-45%), asymptomatic CLL, symptomatic PVCs, HTN, HLD.   Assessment / Plan / Recommendation Clinical Impression  Pt participated in speech/language evaluation with his wife present. Both parties denied any acute changes in cognition/language and reported that the pt's speech has returned to baseline. However, they agreed that the pt has been having increasing difficulty with memory prior to admission. Per the pt, he has notably less difficulty with recall of numbers,  but he uses word associations for recall of words and now has to write important events on a calendar. His speech and language skills are currently within normal limits. His cognitive-linguistic skills were within functional limits with some difficulty with memory and need for additional processing time for more complex tasks. Further skilled SLP services are not clinically indicated at this time, but pt and his wife agreed that  they will follow up with the pt's PCP regarding his baseline difficulty with memory.    SLP Assessment  SLP Recommendation/Assessment: Patient does not need any further Speech Lanaguage Pathology Services SLP Visit Diagnosis: Cognitive communication deficit (R41.841)    Follow Up Recommendations  None    Frequency and Duration           SLP Evaluation Cognition  Overall Cognitive Status: History of cognitive impairments - at baseline Arousal/Alertness: Awake/alert Orientation Level: Oriented X4 Attention: Focused;Sustained Focused Attention: Appears intact Sustained Attention: Appears intact Memory: Impaired Memory Impairment: Retrieval deficit;Decreased recall of new information (Immediate: 3/3; delayed: 0/3; with cues: 3/3) Awareness: Appears intact Problem Solving: Appears intact       Comprehension  Auditory Comprehension Overall Auditory Comprehension: Appears within functional limits for tasks assessed Yes/No Questions: Within Functional Limits Basic Biographical Questions:  (5/5) Complex Questions:  (5/5) Commands: Within Functional Limits Two Step Basic Commands:  (4/4) Multistep Basic Commands:  (3/3) Reading Comprehension Reading Status: Within funtional limits    Expression Expression Primary Mode of Expression: Verbal Verbal Expression Overall Verbal Expression: Appears within functional limits for tasks assessed Initiation: No impairment Automatic Speech: Counting;Day of week;Month of year (WNL) Level of Generative/Spontaneous Verbalization: Conversation Repetition: No impairment (5/5) Naming: No impairment Pragmatics: No impairment Written Expression Dominant Hand: Right   Oral / Motor  Oral Motor/Sensory Function Overall Oral Motor/Sensory Function: Within functional limits Motor Speech Overall Motor Speech: Appears within functional limits for tasks assessed Respiration: Within functional limits Phonation: Normal Resonance: Within functional  limits Articulation: Within functional limitis Intelligibility: Intelligible Motor Planning: Witnin functional limits Motor Speech Errors: Not applicable   Chenika Nevils I. Hardin Negus, [place]annie, [place] Office number [phone] Pager [phone]                    Horton [place] 11/13/2020, 5:34 PM

## 2020-11-13 NOTE — Progress Notes (Signed)
Patient arrived to 3w 02  

## 2020-11-13 NOTE — Progress Notes (Signed)
The patient got confused after Ativan IV for MRI. He was very impulsive and now resting quietly with his eyes closed. Notified the provider on call without any new order.  Will continue to monitor.
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
## 2020-11-13 NOTE — Progress Notes (Signed)
Echocardiogram 2D Echocardiogram has been performed.  Nathan Hall 11/13/2020, 12:24 PM
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
## 2020-11-13 NOTE — Procedures (Signed)
Patient Name: Nathan Hall  MRN: [id number]  Epilepsy Attending: Lora Havens  Referring Physician/Provider: Dr. Kerney Elbe Date: 11/13/2020 Duration: 24.43 mins  Patient history: 86 year old male with rhythmic left facial twitching and dysarthria.  EEG [name] seizures.  Level of alertness: Awake  AEDs during EEG study: Keppra  Technical aspects: This EEG study was done with scalp electrodes positioned according to the 10-20 International system of electrode placement. Electrical activity was acquired at a sampling rate of 500Hz  and reviewed with a high frequency filter of 70Hz  and a low frequency filter of 1Hz . EEG data were recorded continuously and digitally stored.   Description: The posterior dominant rhythm consists of 8 Hz activity of moderate voltage (25-35 uV) seen predominantly in posterior head regions, symmetric and reactive to eye opening and eye closing. Hyperventilation and photic stimulation were not performed.     IMPRESSION: This study is within normal limits. No seizures or epileptiform discharges were seen throughout the recording.  [name]

## 2020-11-14 ENCOUNTER — Inpatient Hospital Stay (HOSPITAL_COMMUNITY): Payer: PPO

## 2020-11-14 ENCOUNTER — Observation Stay (HOSPITAL_COMMUNITY): Payer: PPO

## 2020-11-14 DIAGNOSIS — R0902 Hypoxemia: Secondary | ICD-10-CM | POA: Diagnosis not present

## 2020-11-14 DIAGNOSIS — Z20822 Contact with and (suspected) exposure to covid-19: Secondary | ICD-10-CM | POA: Diagnosis not present

## 2020-11-14 DIAGNOSIS — I5022 Chronic systolic (congestive) heart failure: Secondary | ICD-10-CM | POA: Diagnosis not present

## 2020-11-14 DIAGNOSIS — C9110 Chronic lymphocytic leukemia of B-cell type not having achieved remission: Secondary | ICD-10-CM | POA: Diagnosis not present

## 2020-11-14 DIAGNOSIS — I493 Ventricular premature depolarization: Secondary | ICD-10-CM | POA: Diagnosis present

## 2020-11-14 DIAGNOSIS — N400 Enlarged prostate without lower urinary tract symptoms: Secondary | ICD-10-CM | POA: Diagnosis present

## 2020-11-14 DIAGNOSIS — R569 Unspecified convulsions: Secondary | ICD-10-CM

## 2020-11-14 DIAGNOSIS — K219 Gastro-esophageal reflux disease without esophagitis: Secondary | ICD-10-CM | POA: Diagnosis not present

## 2020-11-14 DIAGNOSIS — I255 Ischemic cardiomyopathy: Secondary | ICD-10-CM | POA: Diagnosis present

## 2020-11-14 DIAGNOSIS — Z8572 Personal history of non-Hodgkin lymphomas: Secondary | ICD-10-CM | POA: Diagnosis not present

## 2020-11-14 DIAGNOSIS — R253 Fasciculation: Secondary | ICD-10-CM | POA: Diagnosis present

## 2020-11-14 DIAGNOSIS — C711 Malignant neoplasm of frontal lobe: Secondary | ICD-10-CM

## 2020-11-14 DIAGNOSIS — R0681 Apnea, not elsewhere classified: Secondary | ICD-10-CM | POA: Diagnosis not present

## 2020-11-14 DIAGNOSIS — Z8249 Family history of ischemic heart disease and other diseases of the circulatory system: Secondary | ICD-10-CM | POA: Diagnosis not present

## 2020-11-14 DIAGNOSIS — F419 Anxiety disorder, unspecified: Secondary | ICD-10-CM | POA: Diagnosis present

## 2020-11-14 DIAGNOSIS — I110 Hypertensive heart disease with heart failure: Secondary | ICD-10-CM | POA: Diagnosis not present

## 2020-11-14 DIAGNOSIS — R471 Dysarthria and anarthria: Secondary | ICD-10-CM | POA: Diagnosis present

## 2020-11-14 DIAGNOSIS — Z823 Family history of stroke: Secondary | ICD-10-CM | POA: Diagnosis not present

## 2020-11-14 DIAGNOSIS — R000 Tachycardia, unspecified: Secondary | ICD-10-CM | POA: Diagnosis not present

## 2020-11-14 DIAGNOSIS — I2510 Atherosclerotic heart disease of native coronary artery without angina pectoris: Secondary | ICD-10-CM | POA: Diagnosis not present

## 2020-11-14 DIAGNOSIS — R451 Restlessness and agitation: Secondary | ICD-10-CM | POA: Diagnosis present

## 2020-11-14 DIAGNOSIS — G9389 Other specified disorders of brain: Secondary | ICD-10-CM | POA: Diagnosis not present

## 2020-11-14 DIAGNOSIS — G4089 Other seizures: Secondary | ICD-10-CM | POA: Diagnosis not present

## 2020-11-14 DIAGNOSIS — E785 Hyperlipidemia, unspecified: Secondary | ICD-10-CM | POA: Diagnosis not present

## 2020-11-14 DIAGNOSIS — G936 Cerebral edema: Secondary | ICD-10-CM | POA: Diagnosis present

## 2020-11-14 DIAGNOSIS — I351 Nonrheumatic aortic (valve) insufficiency: Secondary | ICD-10-CM | POA: Diagnosis not present

## 2020-11-14 DIAGNOSIS — I10 Essential (primary) hypertension: Secondary | ICD-10-CM | POA: Diagnosis not present

## 2020-11-14 LAB — CBC
HCT: 38.7 % — ABNORMAL LOW (ref 39.0–52.0)
Hemoglobin: 12.9 g/dL — ABNORMAL LOW (ref 13.0–17.0)
MCH: 32.6 pg (ref 26.0–34.0)
MCHC: 33.3 g/dL (ref 30.0–36.0)
MCV: 97.7 fL (ref 80.0–100.0)
Platelets: 148 10*3/uL — ABNORMAL LOW (ref 150–400)
RBC: 3.96 MIL/uL — ABNORMAL LOW (ref 4.22–5.81)
RDW: 12.1 % (ref 11.5–15.5)
WBC: 8.7 10*3/uL (ref 4.0–10.5)
nRBC: 0 % (ref 0.0–0.2)

## 2020-11-14 LAB — BASIC METABOLIC PANEL
Anion gap: 4 — ABNORMAL LOW (ref 5–15)
BUN: 16 mg/dL (ref 8–23)
CO2: 28 mmol/L (ref 22–32)
Calcium: 8.6 mg/dL — ABNORMAL LOW (ref 8.9–10.3)
Chloride: 105 mmol/L (ref 98–111)
Creatinine, Ser: 0.99 mg/dL (ref 0.61–1.24)
GFR, Estimated: >60 mL/min (ref >60)
Glucose, Bld: 130 mg/dL — ABNORMAL HIGH (ref 70–99)
Potassium: 3.7 mmol/L (ref 3.5–5.1)
Sodium: 137 mmol/L (ref 135–145)

## 2020-11-14 LAB — GLUCOSE, CAPILLARY
Glucose-Capillary: 133 mg/dL — ABNORMAL HIGH (ref 70–99)
Glucose-Capillary: 121 mg/dL — ABNORMAL HIGH (ref 70–99)
Glucose-Capillary: 131 mg/dL — ABNORMAL HIGH (ref 70–99)
Glucose-Capillary: 111 mg/dL — ABNORMAL HIGH (ref 70–99)
Glucose-Capillary: 135 mg/dL — ABNORMAL HIGH (ref 70–99)

## 2020-11-14 LAB — HEMOGLOBIN A1C
Hgb A1c MFr Bld: 5.9 % — ABNORMAL HIGH (ref 4.8–5.6)
Mean Plasma Glucose: 123 mg/dL

## 2020-11-14 MED ORDER — IOHEXOL 9 MG/ML PO SOLN
500.0000 mL | ORAL | Status: AC
  Administered 2020-11-14: 500 mL via ORAL

## 2020-11-14 MED ORDER — GADOBUTROL 1 MMOL/ML IV SOLN
7.5000 mL | Freq: Once | INTRAVENOUS | Status: AC | PRN
  Administered 2020-11-14: 7.5 mL via INTRAVENOUS

## 2020-11-14 MED ORDER — IOHEXOL 300 MG/ML  SOLN
100.0000 mL | Freq: Once | INTRAMUSCULAR | Status: AC | PRN
  Administered 2020-11-14: 100 mL via INTRAVENOUS

## 2020-11-14 NOTE — Progress Notes (Addendum)
MRI obtained yesterday night was a truncated examination due to patient altered mental status and inability to tolerate the full length of the examination. Within that limitation, there is a rounded area of gyral expansion in the right frontal lobe. Repeat MRI with contrast is recommended when the patient can tolerate it to exclude a neoplastic process.   EEG obtained yesterday morning was within normal limits.   Repeat MRI brain with contrast has been ordered. Will defer to primary team regarding whether or not the patient needs to be sedated for the repeat MRI.   Electronically signed: Dr. Kerney Elbe

## 2020-11-14 NOTE — Progress Notes (Addendum)
The NT reported to this RN that she noted the patient's wife administering his home medication to him. Asked the wife and  she stated the patient demanded her to give them to him.   Patient endorsed taking half of Potassium pill which is 10 mg , Buspar 10 mg, Mexitil 150 mg and Coreg 25 mg. Patient's home medications  are taken to the pharmacy Rechecked vital signs stable. No acute distress noted. Notified Dr Alfonse Spruce. Will continue to monitor.

## 2020-11-14 NOTE — Progress Notes (Signed)
Have encouraged patient to call for assistance with ambulation and ADL's throughout the day with call light in hand. Pt demonstrated ability to use assistance buttons. Pt found ambulating with wife in the hallway without assistance from nursing staff. I escorted patient back to room and he explained that he turned off bed alarm so that he could take a walk in hallways. I have already messaged PT as patient is very wobbly an has unsteady gait, would like re-evaluation regarding home needs. Patient denies needs and refuses chair or bed alarms. Have education patient and wife on risks and they state understanding of risks and are willing to assume liability for injury sustained while walking without assistance. I have told patient I will come to the room later this afternoon to take him for a walk in the hall when I have time after his CT, to which he is agreeable. Will continue to monitor and assess for safety.

## 2020-11-14 NOTE — Progress Notes (Addendum)
   Subjective:   Doing well today, able to tolerate breakfast. Has been able to urinate and pass BM without difficulty.   Per wife, patient had a bad reaction to medication given for seizure-like activity and thus was unable to tolerate MRI. Patient was somewhat confused from it as well. However, he is doing much better this morning. Wife and patient state that patient should not have any difficulty with repeat MRI today and will not need any medications prior to getting it done.  He was able to work well with PT/OT. Also, was able to walk the hallways earlier today without difficulty.  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 Obtaining CT Chest, Abdomen, Pelvis to evaluate for primary malignancy and obtaining PSA as well. May need to consult neurosurgery for brain biopsy if no primary malignancy discovered.   Objective:  Vital signs in last 24 hours: Vitals:   11/13/20 2336 11/14/20 0326 11/14/20 0541 11/14/20 0843  BP: (!) 105/56 (!) 108/56 (!) 105/51 (!) 110/59  Pulse: 65 77 75 73  Resp: 19 18 18 16   Temp: 98.1 F (36.7 C) 99 F (37.2 C) 98.2 F (36.8 C) 97.8 F (36.6 C)  TempSrc: Oral Oral Oral Oral  SpO2: 94% 92% 94% 96%  Weight:      Height:       Physical Exam: General: elderly male, sitting up in bed eating breakfast, NAD. CV: normal rate and regular rhythm, no m/r/g. Pulm: CTABL, no adventitious sounds noted, normal work of breathing on RA. Abdomen: soft, nondistended, nontender, normoactive bowel sounds. MSK: no peripheral edema noted. Neuro: AAOx4, no focal deficits noted. Strength 5/5 bilaterally and sensation intact bilaterally. CN II-XII intact. No dysarthria or facial twitching noted on exam. Psych: calm and cooperative, normal mood and affect.  Assessment/Plan:  Principal Problem:   CVA (cerebral vascular accident) [facility]) Active Problems:    Dyslipidemia   Essential hypertension   Seizure ([place])  Seizure-like activity Possible CVA Patients presenting symptoms (primarily uncontrollable mouth twitching) have resolved. Spot EEG with no seizure activity, but this was after receiving a benzodiazepine. CT head with no hemorrhage, but showed area of possible infarction. CTA head and neck with no LVO. Was unable to tolerate MRI brain yesterday due to confusion/agitation from benzo, will repeat today for further evaluation. Neurology following, greatly appreciate recommendations. No signs of infection at this time as patient has remained afebrile, leukocytosis has resolved, negative urinalysis and CXR. -Continue ASA, lipitor -continue keppra 500mg  BID  -f/u repeat MRI brain -no PT/OT/SLP f/u recommended -LDL 50 -HbA1c 5.9% -permissive HTN for today  Chronic systolic HF 2/2 ischemic CM CAD s/p circumflex/LAD stents Last ECHO 11/2019 with EF 40-45%, G1DD. Repeat ECHO 11/13/2020 with EF 35-40%, moderate decreased LV function, regional wall abnormalities (not seen on prior), and moderate AR. Patient is on lasix and spironolactone at home, but these are being held here as patient is euvolemic on exam. Home coreg being held for permissive HTN. -continue ASA, lipitor -daily weights, I/O's -holding coreg for permissive HTN  Prior to Admission Living Arrangement: Home Anticipated Discharge Location: Home Barriers to Discharge: continued medical management Dispo: Anticipated discharge in approximately 0-1 day(s).   Virl Axe, MD 11/14/2020, 8:49 AM Pager: [phone] After 5pm on weekdays and 1pm on weekends: On Call pager [phone]

## 2020-11-15 LAB — CBC
HCT: 43.5 % (ref 39.0–52.0)
Hemoglobin: 14.2 g/dL (ref 13.0–17.0)
MCH: 32 pg (ref 26.0–34.0)
MCHC: 32.6 g/dL (ref 30.0–36.0)
MCV: 98 fL (ref 80.0–100.0)
Platelets: 178 10*3/uL (ref 150–400)
RBC: 4.44 MIL/uL (ref 4.22–5.81)
RDW: 12.1 % (ref 11.5–15.5)
WBC: 10.4 10*3/uL (ref 4.0–10.5)
nRBC: 0 % (ref 0.0–0.2)

## 2020-11-15 LAB — GLUCOSE, CAPILLARY
Glucose-Capillary: 102 mg/dL — ABNORMAL HIGH (ref 70–99)
Glucose-Capillary: 136 mg/dL — ABNORMAL HIGH (ref 70–99)
Glucose-Capillary: 144 mg/dL — ABNORMAL HIGH (ref 70–99)

## 2020-11-15 LAB — PSA: Prostatic Specific Antigen: 4.97 ng/mL — ABNORMAL HIGH (ref 0.00–4.00)

## 2020-11-15 LAB — BASIC METABOLIC PANEL
Anion gap: 5 (ref 5–15)
BUN: 11 mg/dL (ref 8–23)
CO2: 26 mmol/L (ref 22–32)
Calcium: 9.1 mg/dL (ref 8.9–10.3)
Chloride: 106 mmol/L (ref 98–111)
Creatinine, Ser: 1.02 mg/dL (ref 0.61–1.24)
GFR, Estimated: >60 mL/min (ref >60)
Glucose, Bld: 104 mg/dL — ABNORMAL HIGH (ref 70–99)
Potassium: 4.7 mmol/L (ref 3.5–5.1)
Sodium: 137 mmol/L (ref 135–145)

## 2020-11-15 MED ORDER — BUSPIRONE HCL 10 MG PO TABS
10.0000 mg | ORAL_TABLET | Freq: Two times a day (BID) | ORAL | Status: DC | PRN
  Administered 2020-11-15 – 2020-11-18 (×6): 10 mg via ORAL
  Filled 2020-11-15 (×6): qty 1

## 2020-11-15 MED ORDER — FUROSEMIDE 20 MG PO TABS
20.0000 mg | ORAL_TABLET | Freq: Every day | ORAL | Status: DC
  Administered 2020-11-15 – 2020-11-18 (×3): 20 mg via ORAL
  Filled 2020-11-15 (×3): qty 1

## 2020-11-15 MED ORDER — POTASSIUM CHLORIDE CRYS ER 10 MEQ PO TBCR
10.0000 meq | EXTENDED_RELEASE_TABLET | Freq: Two times a day (BID) | ORAL | Status: DC
  Administered 2020-11-15 – 2020-11-19 (×8): 10 meq via ORAL
  Filled 2020-11-15 (×8): qty 1
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
## 2020-11-15 NOTE — Progress Notes (Signed)
MRI with and without contrast revealed a solitary 17 mm rim enhancing mass in the right frontal operculum with regional edema. DDx favors a solitary Brain Metastasis over other differential considerations such as High-grade Glioma.   Primary team ordered a follow-up CT Chest, Abdomen, and Pelvis to evaluate for a primary malignancy, which showed no focal parenchymal lesion to correspond with the given clinical history of metastatic disease.  The solitary lesion is therefore now more likely to be a primary brain tumor, such as high grade glioma.  A/R: 86 year old male with new onset seizure, most likely secondary to a newly diagnosed right frontal lobe brain tumor seen on MRI  - Will need Oncology and Neurosurgery consults in anticipation of possible biopsy of the right frontal lobe lesion.  - Continue Keppra indefinitely - Will need outpatient Neurology follow up - Neurohospitalist service will sign off. Please call with additional questions   Electronically signed: Dr. Kerney Elbe

## 2020-11-15 NOTE — Consult Note (Signed)
Neurosurgery Consultation  Reason for Consult: Brain mass Referring Physician: Allyson Sabal  CC: Facial seizure  HPI: This is a 86 y.o. man that presents with a few episodes of focal twitching of one side of his face, he can't recall which side. They were self limited for a few minutes at a time, no progression / march into the hand, hasn't had these before, came to the ED. He has a h/o CLL, no recent infections / periodontal infections, started on keppra and hasn't had another episode since then. He denies any new weakness, numbness, or parasthesias, no aura, pt is R handed and has always been right handed.  +[id number] otherwise no recent use of anti-platelet or anti-coagulant medications. On ROS he does endorse that he has difficulty smiling in his normal way, he's unsure why.  ROS: A 14 point ROS was performed and is negative except as noted in the HPI.   PMHx:  Past Medical History:  Diagnosis Date   Abnormal nuclear stress test 10/07/2016   Aftercare following surgery 02/22/2018   Cancer ([place])    Cardiomyopathy ([place]) 12/09/2014   Overview:  Ejection fraction 30% May 2018   Cataract    were removed   Cataract    were removed   CHF (congestive heart failure) ([place])    CLL (chronic lymphocytic leukemia) ([place])    Coronary artery disease    Coronary artery disease involving native coronary artery of native heart without angina pectoris 12/09/2014   Formatting of this note might be different from the original. LAD and Cx stents 2018 Formatting of this note might be different from the original. PTCA and drug-eluting stent to left anterior descending artery and circumflex artery in May 2018   Dyslipidemia 12/09/2014   Essential hypertension 12/09/2014   GERD (gastroesophageal reflux disease)    Hyperlipidemia    Hypertension    Ingrown nail 02/15/2018   Intermittent lightheadedness 10/13/2016   LV dysfunction 04/11/2017   Pulmonary infiltrates on CXR 01/26/2017   Onset symptoms was late June 2018 and  CT scan 12/05/16 c/w as dz LLL/ clear on f/u 01/26/2017  -        PVC's (premature ventricular contractions) 10/07/2016   Rhinitis, chronic 01/27/2017   Allergy profile 01/26/2017 >  Eos 0.1 /  IgE  53 RAST pos dust only    FamHx:  Family History  Problem Relation Age of Onset   Pneumonia Mother    Stomach cancer Father    Congestive Heart Failure Father    Heart attack Maternal Grandmother    Alzheimer's disease Maternal Grandfather    Cancer Paternal Grandmother    Heart Problems Sister    SocHx:  reports that he has never smoked. He has never used smokeless tobacco. He reports that he does not drink alcohol and does not use drugs.  Exam: Vital signs in last 24 hours: Temp:  [97.5 F (36.4 C)-98.5 F (36.9 C)] 98.5 F (36.9 C) (06/19 0730) Pulse Rate:  [68-79] 69 (06/19 0730) Resp:  [18-20] 18 (06/19 0730) BP: (103-148)/(48-79) 148/65 (06/19 0730) SpO2:  [95 %-99 %] 97 % (06/19 0730) General: Awake, alert, cooperative, lying in bed in NAD Head: Normocephalic and atruamatic HEENT: Neck supple Pulmonary: breathing room air comfortably, no evidence of increased work of breathing Cardiac: RRR Abdomen: S NT ND Extremities: Warm and well perfused x4 Neuro: AOx3, PERRL, EOMI, some mild facial asymmetry but actually appears to have less activation on the right than left - UMN pattern, symmetric facial sensation Strength  5/5 x4, SILTx4, no drift   Assessment and Plan: 86 y.o. man w/ semiology of focal motor seizures in the face. MRI personally reviewed, which shows a ring enhancing mass in the right frontal region. Using standard landmarks - marginal bracket / T / L / longest sulcus / knob signs, appears to be in lateral precentral gyrus in the facial region on the right. CT CAP neg  -OR tomorrow for craniotomy and resection, NPO p MN ordered, enoxaparin & ASA d/c'd, pt reports most recent coronary stents were 2+y ago -discussed w/ the patient and his wife at length, he needs a  diagnosis, no extracranial lesions available for biopsy, thus craniotomy is warranted. We discussed r/b/a at length, most importantly the risk of UMN pattern facial palsy post-op, will try to minimize this but certainly possible along with the usual risks of intracranial surgery -please call with any concerns or questions  Judith Part, MD 11/15/20 10:29 AM [facility]

## 2020-11-16 ENCOUNTER — Other Ambulatory Visit: Payer: Self-pay | Admitting: *Deleted

## 2020-11-16 ENCOUNTER — Inpatient Hospital Stay (HOSPITAL_COMMUNITY): Payer: PPO | Admitting: Certified Registered Nurse Anesthetist

## 2020-11-16 ENCOUNTER — Encounter (HOSPITAL_COMMUNITY): Payer: Self-pay | Admitting: Student in an Organized Health Care Education/Training Program

## 2020-11-16 ENCOUNTER — Encounter (HOSPITAL_COMMUNITY)
Admission: EM | Disposition: A | Discharge status: Home / Self Care | Payer: Self-pay | Source: Home / Self Care | Attending: Student in an Organized Health Care Education/Training Program

## 2020-11-16 HISTORY — PX: CRANIOTOMY: SHX93

## 2020-11-16 HISTORY — PX: APPLICATION OF CRANIAL NAVIGATION: SHX6578

## 2020-11-16 LAB — GLUCOSE, CAPILLARY
Glucose-Capillary: 107 mg/dL — ABNORMAL HIGH (ref 70–99)
Glucose-Capillary: 107 mg/dL — ABNORMAL HIGH (ref 70–99)
Glucose-Capillary: 92 mg/dL (ref 70–99)

## 2020-11-16 LAB — SURGICAL PCR SCREEN
MRSA, PCR: NEGATIVE
Staphylococcus aureus: NEGATIVE

## 2020-11-16 LAB — TYPE AND SCREEN
ABO/RH(D): A NEG
Antibody Screen: NEGATIVE

## 2020-11-16 SURGERY — CRANIOTOMY TUMOR EXCISION
Anesthesia: General | Site: Head | Laterality: Right

## 2020-11-16 MED ORDER — ONDANSETRON HCL 4 MG/2ML IJ SOLN
INTRAMUSCULAR | Status: AC
  Filled 2020-11-16: qty 2

## 2020-11-16 MED ORDER — LIDOCAINE-EPINEPHRINE 1 %-1:100000 IJ SOLN
INTRAMUSCULAR | Status: AC
  Filled 2020-11-16: qty 1

## 2020-11-16 MED ORDER — CHLORHEXIDINE GLUCONATE CLOTH 2 % EX PADS
6.0000 | MEDICATED_PAD | Freq: Every day | CUTANEOUS | Status: DC
  Administered 2020-11-16 – 2020-11-18 (×3): 6 via TOPICAL

## 2020-11-16 MED ORDER — PROPOFOL 10 MG/ML IV BOLUS
INTRAVENOUS | Status: AC
  Filled 2020-11-16: qty 20

## 2020-11-16 MED ORDER — FENTANYL CITRATE (PF) 250 MCG/5ML IJ SOLN
INTRAMUSCULAR | Status: AC
  Filled 2020-11-16: qty 5

## 2020-11-16 MED ORDER — LABETALOL HCL 5 MG/ML IV SOLN
INTRAVENOUS | Status: AC
  Filled 2020-11-16: qty 4

## 2020-11-16 MED ORDER — ONDANSETRON HCL 4 MG/2ML IJ SOLN: 4.0000 mg | Freq: Four times a day (QID) | INTRAMUSCULAR | Status: DC | PRN

## 2020-11-16 MED ORDER — OXYCODONE HCL 5 MG PO TABS: 5.0000 mg | ORAL_TABLET | Freq: Once | ORAL | Status: DC | PRN

## 2020-11-16 MED ORDER — DEXAMETHASONE SODIUM PHOSPHATE 10 MG/ML IJ SOLN
INTRAMUSCULAR | Status: DC | PRN
  Administered 2020-11-16: 10 mg via INTRAVENOUS

## 2020-11-16 MED ORDER — FENTANYL CITRATE (PF) 100 MCG/2ML IJ SOLN
INTRAMUSCULAR | Status: DC | PRN
  Administered 2020-11-16 (×3): 50 ug via INTRAVENOUS

## 2020-11-16 MED ORDER — THROMBIN 5000 UNITS EX SOLR: OROMUCOSAL | Status: DC | PRN

## 2020-11-16 MED ORDER — FENTANYL CITRATE (PF) 100 MCG/2ML IJ SOLN: 25.0000 ug | INTRAMUSCULAR | Status: DC | PRN

## 2020-11-16 MED ORDER — BACITRACIN ZINC 500 UNIT/GM EX OINT
TOPICAL_OINTMENT | CUTANEOUS | Status: AC
  Filled 2020-11-16: qty 28.35

## 2020-11-16 MED ORDER — CEFAZOLIN SODIUM-DEXTROSE 2-3 GM-%(50ML) IV SOLR
INTRAVENOUS | Status: DC | PRN
  Administered 2020-11-16: 2 g via INTRAVENOUS

## 2020-11-16 MED ORDER — LIDOCAINE-EPINEPHRINE 1 %-1:100000 IJ SOLN
INTRAMUSCULAR | Status: DC | PRN
  Administered 2020-11-16: 7 mL

## 2020-11-16 MED ORDER — THROMBIN 20000 UNITS EX SOLR: CUTANEOUS | Status: DC | PRN

## 2020-11-16 MED ORDER — PROPOFOL 10 MG/ML IV BOLUS
INTRAVENOUS | Status: DC | PRN
  Administered 2020-11-16: 140 mg via INTRAVENOUS
  Administered 2020-11-16: 60 mg via INTRAVENOUS

## 2020-11-16 MED ORDER — 0.9 % SODIUM CHLORIDE (POUR BTL) OPTIME
TOPICAL | Status: DC | PRN
  Administered 2020-11-16: 1000 mL

## 2020-11-16 MED ORDER — CHLORHEXIDINE GLUCONATE 0.12 % MT SOLN
15.0000 mL | Freq: Once | OROMUCOSAL | Status: AC
  Administered 2020-11-16: 15 mL via OROMUCOSAL
  Filled 2020-11-16: qty 15

## 2020-11-16 MED ORDER — OXYCODONE HCL 5 MG PO TABS
5.0000 mg | ORAL_TABLET | ORAL | Status: DC | PRN
  Administered 2020-11-16: 5 mg via ORAL
  Filled 2020-11-16: qty 1

## 2020-11-16 MED ORDER — PHENYLEPHRINE HCL-NACL 10-0.9 MG/250ML-% IV SOLN
INTRAVENOUS | Status: DC | PRN
  Administered 2020-11-16: 25 ug/min via INTRAVENOUS

## 2020-11-16 MED ORDER — LABETALOL HCL 5 MG/ML IV SOLN
10.0000 mg | Freq: Once | INTRAVENOUS | Status: AC
  Administered 2020-11-16: 10 mg via INTRAVENOUS

## 2020-11-16 MED ORDER — OXYCODONE HCL 5 MG/5ML PO SOLN: 5.0000 mg | Freq: Once | ORAL | Status: DC | PRN

## 2020-11-16 MED ORDER — DEXAMETHASONE SODIUM PHOSPHATE 10 MG/ML IJ SOLN
INTRAMUSCULAR | Status: AC
  Filled 2020-11-16: qty 1

## 2020-11-16 MED ORDER — THROMBIN 20000 UNITS EX SOLR
CUTANEOUS | Status: AC
  Filled 2020-11-16: qty 20000

## 2020-11-16 MED ORDER — BACITRACIN ZINC 500 UNIT/GM EX OINT
TOPICAL_OINTMENT | CUTANEOUS | Status: DC | PRN
  Administered 2020-11-16: 1 via TOPICAL

## 2020-11-16 MED ORDER — THROMBIN 5000 UNITS EX SOLR
CUTANEOUS | Status: AC
  Filled 2020-11-16: qty 5000

## 2020-11-16 MED ORDER — LIDOCAINE HCL (PF) 2 % IJ SOLN
INTRAMUSCULAR | Status: AC
  Filled 2020-11-16: qty 5

## 2020-11-16 MED ORDER — LABETALOL HCL 5 MG/ML IV SOLN: 5.0000 mg | Freq: Once | INTRAVENOUS | Status: DC

## 2020-11-16 MED ORDER — ONDANSETRON HCL 4 MG/2ML IJ SOLN: 4.0000 mg | Freq: Once | INTRAMUSCULAR | Status: DC | PRN

## 2020-11-16 MED ORDER — SODIUM CHLORIDE 0.9 % IV SOLN: INTRAVENOUS | Status: DC | PRN

## 2020-11-16 MED ORDER — CEFAZOLIN SODIUM 1 G IJ SOLR
INTRAMUSCULAR | Status: AC
  Filled 2020-11-16: qty 20

## 2020-11-16 MED ORDER — LIDOCAINE HCL (PF) 2 % IJ SOLN
INTRAMUSCULAR | Status: DC | PRN
  Administered 2020-11-16: 60 mg via INTRADERMAL

## 2020-11-16 MED ORDER — LACTATED RINGERS IV SOLN: INTRAVENOUS | Status: DC

## 2020-11-16 MED ORDER — SUGAMMADEX SODIUM 200 MG/2ML IV SOLN
INTRAVENOUS | Status: DC | PRN
  Administered 2020-11-16: 200 mg via INTRAVENOUS

## 2020-11-16 MED ORDER — ONDANSETRON HCL 4 MG/2ML IJ SOLN
INTRAMUSCULAR | Status: DC | PRN
  Administered 2020-11-16: 4 mg via INTRAVENOUS

## 2020-11-16 MED ORDER — ROCURONIUM BROMIDE 10 MG/ML (PF) SYRINGE
PREFILLED_SYRINGE | INTRAVENOUS | Status: AC
  Filled 2020-11-16: qty 10

## 2020-11-16 MED ORDER — OXYCODONE HCL 5 MG PO TABS: 10.0000 mg | ORAL_TABLET | ORAL | Status: DC | PRN

## 2020-11-16 MED ORDER — HYDRALAZINE HCL 20 MG/ML IJ SOLN
10.0000 mg | INTRAMUSCULAR | Status: DC | PRN
  Administered 2020-11-17: 20 mg via INTRAVENOUS
  Filled 2020-11-16 (×2): qty 1

## 2020-11-16 MED ORDER — ORAL CARE MOUTH RINSE: 15.0000 mL | Freq: Once | OROMUCOSAL | Status: AC

## 2020-11-16 MED ORDER — ROCURONIUM BROMIDE 10 MG/ML (PF) SYRINGE
PREFILLED_SYRINGE | INTRAVENOUS | Status: DC | PRN
  Administered 2020-11-16: 60 mg via INTRAVENOUS
  Administered 2020-11-16: 20 mg via INTRAVENOUS

## 2020-11-16 SURGICAL SUPPLY — 88 items
BAND RUBBER #18 3X1/16 STRL (MISCELLANEOUS) IMPLANT
BENZOIN TINCTURE PRP APPL 2/3 (GAUZE/BANDAGES/DRESSINGS) IMPLANT
BLADE CLIPPER SURG (BLADE) ×2 IMPLANT
BLADE SAW GIGLI 16 STRL (MISCELLANEOUS) IMPLANT
BLADE SURG 15 STRL LF DISP TIS (BLADE) IMPLANT
BLADE SURG 15 STRL SS (BLADE)
BNDG GAUZE ELAST 4 BULKY (GAUZE/BANDAGES/DRESSINGS) IMPLANT
BNDG STRETCH 4X75 STRL LF (GAUZE/BANDAGES/DRESSINGS) IMPLANT
BUR ACORN 9.0 PRECISION (BURR) ×2 IMPLANT
BUR ROUND FLUTED 4 SOFT TCH (BURR) IMPLANT
BUR SPIRAL ROUTER 2.3 (BUR) ×2 IMPLANT
CANISTER SUCT 3000ML PPV (MISCELLANEOUS) ×4 IMPLANT
CATH VENTRIC 35X38 W/TROCAR LG (CATHETERS) IMPLANT
CLIP VESOCCLUDE MED 6/CT (CLIP) IMPLANT
CNTNR URN SCR LID CUP LEK RST (MISCELLANEOUS) ×1 IMPLANT
CONT SPEC 4OZ STRL OR WHT (MISCELLANEOUS) ×2
COVER BURR HOLE UNIV 10 (Orthopedic Implant) ×2 IMPLANT
COVER MAYO STAND STRL (DRAPES) IMPLANT
COVER WAND RF STERILE (DRAPES) ×2 IMPLANT
DECANTER SPIKE VIAL GLASS SM (MISCELLANEOUS) ×2 IMPLANT
DRAIN SUBARACHNOID (WOUND CARE) IMPLANT
DRAPE HALF SHEET 40X57 (DRAPES) ×2 IMPLANT
DRAPE MICROSCOPE LEICA (MISCELLANEOUS) IMPLANT
DRAPE NEUROLOGICAL W/INCISE (DRAPES) ×2 IMPLANT
DRAPE STERI IOBAN 125X83 (DRAPES) IMPLANT
DRAPE SURG 17X23 STRL (DRAPES) IMPLANT
DRAPE WARM FLUID 44X44 (DRAPES) ×2 IMPLANT
DRSG ADAPTIC 3X8 NADH LF (GAUZE/BANDAGES/DRESSINGS) IMPLANT
DRSG TELFA 3X8 NADH (GAUZE/BANDAGES/DRESSINGS) IMPLANT
DURAPREP 6ML APPLICATOR 50/CS (WOUND CARE) ×2 IMPLANT
ELECT REM PT RETURN 9FT ADLT (ELECTROSURGICAL) ×2
ELECTRODE REM PT RTRN 9FT ADLT (ELECTROSURGICAL) ×1 IMPLANT
EVACUATOR 1/8 PVC DRAIN (DRAIN) IMPLANT
EVACUATOR SILICONE 100CC (DRAIN) IMPLANT
FORCEPS BIPOLAR SPETZLER 8 1.0 (NEUROSURGERY SUPPLIES) ×2 IMPLANT
GAUZE 4X4 16PLY RFD (DISPOSABLE) IMPLANT
GAUZE SPONGE 4X4 12PLY STRL (GAUZE/BANDAGES/DRESSINGS) IMPLANT
GLOVE BIO SURGEON STRL SZ7 (GLOVE) IMPLANT
GLOVE BIOGEL PI IND STRL 7.5 (GLOVE) ×1 IMPLANT
GLOVE BIOGEL PI INDICATOR 7.5 (GLOVE) ×1
GLOVE ECLIPSE 7.5 STRL STRAW (GLOVE) ×2 IMPLANT
GLOVE EXAM NITRILE LRG STRL (GLOVE) IMPLANT
GLOVE EXAM NITRILE XS STR PU (GLOVE) IMPLANT
GLOVE SURG UNDER POLY LF SZ7 (GLOVE) IMPLANT
GOWN STRL REUS W/ TWL LRG LVL3 (GOWN DISPOSABLE) ×2 IMPLANT
GOWN STRL REUS W/ TWL XL LVL3 (GOWN DISPOSABLE) IMPLANT
GOWN STRL REUS W/TWL 2XL LVL3 (GOWN DISPOSABLE) IMPLANT
GOWN STRL REUS W/TWL LRG LVL3 (GOWN DISPOSABLE) ×4
GOWN STRL REUS W/TWL XL LVL3 (GOWN DISPOSABLE)
HEMOSTAT POWDER KIT SURGIFOAM (HEMOSTASIS) ×2 IMPLANT
HEMOSTAT SURGICEL 2X14 (HEMOSTASIS) ×2 IMPLANT
IV NS 1000ML (IV SOLUTION) ×2
IV NS 1000ML BAXH (IV SOLUTION) ×1 IMPLANT
KIT BASIN OR (CUSTOM PROCEDURE TRAY) ×2 IMPLANT
KIT DRAIN CSF ACCUDRAIN (MISCELLANEOUS) IMPLANT
KIT TURNOVER KIT B (KITS) ×2 IMPLANT
MARKER SPHERE PSV REFLC 13MM (MARKER) ×6 IMPLANT
NEEDLE HYPO 22GX1.5 SAFETY (NEEDLE) ×2 IMPLANT
NEEDLE SPNL 18GX3.5 QUINCKE PK (NEEDLE) IMPLANT
NS IRRIG 1000ML POUR BTL (IV SOLUTION) ×6 IMPLANT
PACK CRANIOTOMY CUSTOM (CUSTOM PROCEDURE TRAY) ×2 IMPLANT
PATTIES SURGICAL .25X.25 (GAUZE/BANDAGES/DRESSINGS) IMPLANT
PATTIES SURGICAL .5 X.5 (GAUZE/BANDAGES/DRESSINGS) IMPLANT
PATTIES SURGICAL .5 X3 (DISPOSABLE) IMPLANT
PATTIES SURGICAL 1/4 X 3 (GAUZE/BANDAGES/DRESSINGS) IMPLANT
PATTIES SURGICAL 1X1 (DISPOSABLE) IMPLANT
PIN MAYFIELD SKULL DISP (PIN) ×2 IMPLANT
PLATE DOUBLE Y CMF 6H (Plate) ×4 IMPLANT
PLATE UNIV CMF 16 2H (Plate) ×2 IMPLANT
SCREW UNIII AXS SD 1.5X4 (Screw) ×26 IMPLANT
SPECIMEN JAR SMALL (MISCELLANEOUS) IMPLANT
SPONGE NEURO XRAY DETECT 1X3 (DISPOSABLE) IMPLANT
SPONGE SURGIFOAM ABS GEL 100 (HEMOSTASIS) ×2 IMPLANT
STAPLER VISISTAT 35W (STAPLE) ×2 IMPLANT
SUT ETHILON 3 0 FSL (SUTURE) IMPLANT
SUT ETHILON 3 0 PS 1 (SUTURE) IMPLANT
SUT MNCRL AB 3-0 PS2 18 (SUTURE) IMPLANT
SUT MON AB 3-0 SH 27 (SUTURE)
SUT MON AB 3-0 SH27 (SUTURE) IMPLANT
SUT NURALON 4 0 TR CR/8 (SUTURE) ×6 IMPLANT
SUT SILK 0 TIES 10X30 (SUTURE) IMPLANT
SUT VIC AB 2-0 CP2 18 (SUTURE) ×2 IMPLANT
TOWEL GREEN STERILE (TOWEL DISPOSABLE) ×2 IMPLANT
TOWEL GREEN STERILE FF (TOWEL DISPOSABLE) ×2 IMPLANT
TRAY FOLEY MTR SLVR 16FR STAT (SET/KITS/TRAYS/PACK) ×2 IMPLANT
TUBE CONNECTING 12X1/4 (SUCTIONS) ×2 IMPLANT
UNDERPAD 30X36 HEAVY ABSORB (UNDERPADS AND DIAPERS) ×2 IMPLANT
WATER STERILE IRR 1000ML POUR (IV SOLUTION) ×2 IMPLANT

## 2020-11-16 NOTE — Op Note (Signed)
PATIENT: Nathan Hall  DAY OF SURGERY: 11/16/20   PRE-OPERATIVE DIAGNOSIS:  Brain tumor   POST-OPERATIVE DIAGNOSIS:  Same   PROCEDURE:  Right craniotomy for tumor resection, use of frameless stereotaxy   SURGEON:  Surgeon(s) and Role:    Judith Part, MD - Primary      ANESTHESIA: ETGA   BRIEF HISTORY: This is an 86 year old man who presented with left sided focal facial seizures. The patient was found to have a mass in the precentral gyrus on the right side, metastatic workup was negative, I therefore recommended resection to obtain a diagnosis and treatment. We discussed risks, benefits, and alternatives at length, most notably a high likelihood of post-op facial and possibly hand weakness and he wished to proceed with surgery.   OPERATIVE DETAIL: The patient was taken to the operating room and placed on the OR table in the supine position. A formal time out was performed with two patient identifiers and confirmed the operative site. Anesthesia was induced by the anesthesia team. The [place] head holder was applied to the head and a registration array was attached to the [place]. This was co-registered with the patient's preoperative imaging, the fit appeared to be acceptable. Using frameless stereotaxy, the operative trajectory was planned and the incision was marked. Hair was clipped with surgical clippers over the incision and the area was then prepped and draped in a sterile fashion.  A linear incision was placed in the right inferior frontal region centered over the lesion. A retractor was placed, a small craniotomy was created, the dura was opened and the lesion was visualized and confirmed with stereotaxy. It was then dissected circumferentially and resected then sent for pathology. It appeared somewhat discrete like a metastasis and fell apart early in the dissection process, so I protected a portion of it with a pattie to allow for a path specimen and was able to get a few  good pieces to send for path. Hemostasis was obtained and confirmed, the wound was copiously irrigated, the dura was closed with suture, the bone flap was replaced with titanium plates and screws. All instrument and sponge counts were correct, the incision was then closed in layers. The patient was then returned to anesthesia for emergence. No apparent complications at the completion of the procedure.   EBL:  62mL   DRAINS: none   SPECIMENS: Right frontal brain tumor   Judith Part, MD 11/16/20 4:01 PM

## 2020-11-16 NOTE — Anesthesia Procedure Notes (Signed)
Procedure Name: Intubation Date/Time: 11/16/2020 4:42 PM Performed by: Barrington Ellison, CRNA Pre-anesthesia Checklist: Patient identified, Emergency Drugs available, Suction available and Patient being monitored Patient Re-evaluated:Patient Re-evaluated prior to induction Oxygen Delivery Method: Circle System Utilized Preoxygenation: Pre-oxygenation with 100% oxygen Induction Type: IV induction Ventilation: Mask ventilation without difficulty and Oral airway inserted - appropriate to patient size Laryngoscope Size: Mac and 4 Grade View: Grade I Tube type: Oral Number of attempts: 1 Airway Equipment and Method: Stylet and Oral airway Placement Confirmation: ETT inserted through vocal cords under direct vision, positive ETCO2 and breath sounds checked- equal and bilateral Secured at: 22 cm Tube secured with: Tape Dental Injury: Teeth and Oropharynx as per pre-operative assessment

## 2020-11-16 NOTE — Progress Notes (Addendum)
  Subjective:   Doing well this morning, reports that he got some good rest overnight.  Asking if he could get a dose of his BuSpar for anxiety.  Wife asking if she can visit patient when he gets out of the OR.  Discussed that we are unsure whether or not patient is going to go to the neuro ICU.  We will reach out to neurosurgery to determine this.   Objective:  Vital signs in last 24 hours: Vitals:   11/15/20 1547 11/15/20 1930 11/15/20 2332 11/16/20 0317  BP: 125/66 127/64 122/63 122/63  Pulse: 84 81 75 80  Resp: 16 19 17 19   Temp: 98.5 F (36.9 C) 98.2 F (36.8 C) 98.4 F (36.9 C) 97.9 F (36.6 C)  TempSrc: Oral Oral Oral Oral  SpO2: 96% 98% 93% 94%  Weight:      Height:       Physical Exam: General: Elderly male, sitting up in bed, NAD. CV: Normal rate and regular rhythm, no murmurs rubs or gallops. Pulm: Clear to auscultation bilaterally, no adventitious sounds noted. Abdomen: Soft, nondistended, nontender, normoactive bowel sounds. MSK: No peripheral edema noted. Neuro: AAOx3, no focal deficits noted.  No dysarthria or facial twitching noted. Psych: Appropriately anxious.  Otherwise cooperative with exam.   Assessment/Plan:  Principal Problem:   Brain mass Active Problems:   Dyslipidemia   Essential hypertension   Seizure-like activity (HCC)  Seizure-like activity 2/2 brain mass in R frontal operculum MRI brain showing 57mm rim enhancing mass in R frontal operculum with mild adjacent edema. This is likely the cause of patient's seizure-like activity.  No primary malignancy found elsewhere in the body.  Neurosurgery to take patient to the OR today for craniotomy and resection.  Patient has been n.p.o. since midnight. -Continue Keppra 500 mg twice daily for secondary prevention -Aspirin and enoxaparin stopped yesterday in anticipation for surgery -Greatly appreciate neurosurgery assistance -Will need close outpatient neurosurgery and/or neurology  follow-up  Prior to Admission Living Arrangement: Home Anticipated Discharge Location: TBD Barriers to Discharge: continued medical management Dispo: Anticipated discharge in approximately 3-4 day(s).   Virl Axe, MD 11/16/2020, 6:12 AM Pager: [phone] After 5pm on weekdays and 1pm on weekends: On Call pager [phone]

## 2020-11-16 NOTE — Progress Notes (Signed)
Neurosurgery Service Progress Note  Subjective: No acute events overnight, no further facial twitching / focal Sz   Objective: Vitals:   11/15/20 1547 11/15/20 1930 11/15/20 2332 11/16/20 0317  BP: 125/66 127/64 122/63 122/63  Pulse: 84 81 75 80  Resp: 16 19 17 19   Temp: 98.5 F (36.9 C) 98.2 F (36.8 C) 98.4 F (36.9 C) 97.9 F (36.6 C)  TempSrc: Oral Oral Oral Oral  SpO2: 96% 98% 93% 94%  Weight:      Height:        Physical Exam: AOx3, PERRL, EOMI, some mild facial asymmetry but actually appears to have less activation on the right than left - UMN pattern, symmetric facial sensation Strength 5/5 x4, SILTx4, no drift  Assessment & Plan: 86 y.o. man w/ L focal facial seizures, MRI w/ R precentral gyrus lesion, CT CAP no other lesions.  -OR today for resection   Judith Part  11/16/20 8:31 AM

## 2020-11-16 NOTE — Anesthesia Postprocedure Evaluation (Signed)
Anesthesia Post Note  Patient: Nathan Hall  Procedure(s) Performed: CRANIOTOMY FOR TUMOR EXCISION (Right: Head) APPLICATION OF CRANIAL NAVIGATION (Right: Head)     Patient location during evaluation: PACU Anesthesia Type: General Level of consciousness: awake and alert Pain management: pain level controlled Vital Signs Assessment: post-procedure vital signs reviewed and stable Respiratory status: spontaneous breathing, nonlabored ventilation, respiratory function stable and patient connected to nasal cannula oxygen Cardiovascular status: blood pressure returned to baseline and stable Postop Assessment: no apparent nausea or vomiting Anesthetic complications: no   No notable events documented.  Last Vitals:  Vitals:   11/16/20 1915 11/16/20 1930  BP: (!) 151/68 (!) 156/71  Pulse: 80 78  Resp: 17 17  Temp:    SpO2: 92% 98%    Last Pain:  Vitals:   11/16/20 1915  TempSrc:   PainSc: 0-No pain                 Kaylan Friedmann COKER

## 2020-11-16 NOTE — Anesthesia Preprocedure Evaluation (Addendum)
Anesthesia Evaluation  Patient identified by MRN, date of birth, ID band Patient awake    Reviewed: Allergy & Precautions, NPO status , Patient's Chart, lab work & pertinent test results  Airway Mallampati: II  TM Distance: >3 FB Neck ROM: Full    Dental  (+) Edentulous Upper   Pulmonary    breath sounds clear to auscultation       Cardiovascular hypertension,  Rhythm:Regular Rate:Normal     Neuro/Psych    GI/Hepatic   Endo/Other    Renal/GU      Musculoskeletal   Abdominal   Peds  Hematology   Anesthesia Other Findings   Reproductive/Obstetrics                            Anesthesia Physical Anesthesia Plan  ASA: 3  Anesthesia Plan: General   Post-op Pain Management:    Induction: Intravenous  PONV Risk Score and Plan: Ondansetron and Dexamethasone  Airway Management Planned: Oral ETT  Additional Equipment: Arterial line  Intra-op Plan:   Post-operative Plan: Extubation in OR  Informed Consent: I have reviewed the patients History and Physical, chart, labs and discussed the procedure including the risks, benefits and alternatives for the proposed anesthesia with the patient or authorized representative who has indicated his/her understanding and acceptance.     Dental advisory given  Plan Discussed with: CRNA and Anesthesiologist  Anesthesia Plan Comments:         Anesthesia Quick Evaluation

## 2020-11-16 NOTE — Anesthesia Procedure Notes (Signed)
Arterial Line Insertion Start/End6/20/2022 4:10 PM, 11/16/2020 4:15 PM Performed by: Barrington Ellison, CRNA, CRNA  Patient location: Pre-op. Preanesthetic checklist: patient identified, IV checked and risks and benefits discussed Left, radial was placed Catheter size: 20 G Hand hygiene performed  and maximum sterile barriers used  Allen's test indicative of satisfactory collateral circulation Attempts: 1 Procedure performed without using ultrasound guided technique. Following insertion, dressing applied and Biopatch. Post procedure assessment: normal  Patient tolerated the procedure well with no immediate complications.

## 2020-11-16 NOTE — Brief Op Note (Signed)
11/13/2020 - 11/16/2020  5:58 PM  PATIENT:  [name]  86 y.o. male  PRE-OPERATIVE DIAGNOSIS:  Brain mass  POST-OPERATIVE DIAGNOSIS:  Brain mass  PROCEDURE:  Procedure(s): CRANIOTOMY FOR TUMOR EXCISION (Right) APPLICATION OF CRANIAL NAVIGATION (Right)  SURGEON:  Surgeon(s) and Role:    * Judith Part, MD - Primary  PHYSICIAN ASSISTANT:   ASSISTANTS: none   ANESTHESIA:   general  EBL:  100 mL   BLOOD ADMINISTERED:none  DRAINS: none   LOCAL MEDICATIONS USED:  LIDOCAINE   SPECIMEN:  No Specimen  DISPOSITION OF SPECIMEN:  PATHOLOGY  COUNTS:  YES  TOURNIQUET:  * No tourniquets in log *  DICTATION: .Note written in EPIC  PLAN OF CARE: Admit to inpatient   PATIENT DISPOSITION:  PACU - hemodynamically stable.   Delay start of Pharmacological VTE agent (>24hrs) due to surgical blood loss or risk of bleeding: yes

## 2020-11-16 NOTE — Transfer of Care (Signed)
Immediate Anesthesia Transfer of Care Note  Patient: Nathan Hall  Procedure(s) Performed: CRANIOTOMY FOR TUMOR EXCISION (Right: Head) APPLICATION OF CRANIAL NAVIGATION (Right: Head)  Patient Location: PACU  Anesthesia Type:General  Level of Consciousness: awake, alert  and oriented  Airway & Oxygen Therapy: Patient Spontanous Breathing and Patient connected to nasal cannula oxygen  Post-op Assessment: Report given to RN, Patient moving all extremities X 4 and Patient able to stick tongue midline  Post vital signs: Reviewed and stable  Last Vitals:  Vitals Value Taken Time  BP 129/60 11/16/20 1813  Temp    Pulse 89 11/16/20 1817  Resp 20 11/16/20 1817  SpO2 95 % 11/16/20 1817  Vitals shown include unvalidated device data.  Last Pain:  Vitals:   11/16/20 1548  TempSrc: Oral  PainSc:          Complications: No notable events documented.

## 2020-11-17 ENCOUNTER — Inpatient Hospital Stay (HOSPITAL_COMMUNITY): Payer: PPO

## 2020-11-17 ENCOUNTER — Encounter (HOSPITAL_COMMUNITY): Payer: Self-pay | Admitting: Neurological Surgery

## 2020-11-17 DIAGNOSIS — R000 Tachycardia, unspecified: Secondary | ICD-10-CM

## 2020-11-17 DIAGNOSIS — I2510 Atherosclerotic heart disease of native coronary artery without angina pectoris: Secondary | ICD-10-CM

## 2020-11-17 DIAGNOSIS — G9389 Other specified disorders of brain: Secondary | ICD-10-CM

## 2020-11-17 DIAGNOSIS — I493 Ventricular premature depolarization: Secondary | ICD-10-CM

## 2020-11-17 LAB — CBC WITH DIFFERENTIAL/PLATELET
Abs Immature Granulocytes: 0.09 10*3/uL — ABNORMAL HIGH (ref 0.00–0.07)
Basophils Absolute: 0 10*3/uL (ref 0.0–0.1)
Basophils Relative: 0 %
Eosinophils Absolute: 0.1 10*3/uL (ref 0.0–0.5)
Eosinophils Relative: 0 %
HCT: 43.7 % (ref 39.0–52.0)
Hemoglobin: 15 g/dL (ref 13.0–17.0)
Immature Granulocytes: 0 %
Lymphocytes Relative: 33 %
Lymphs Abs: 6.8 10*3/uL — ABNORMAL HIGH (ref 0.7–4.0)
MCH: 32.6 pg (ref 26.0–34.0)
MCHC: 34.3 g/dL (ref 30.0–36.0)
MCV: 95 fL (ref 80.0–100.0)
Monocytes Absolute: 0.5 10*3/uL (ref 0.1–1.0)
Monocytes Relative: 3 %
Neutro Abs: 13.2 10*3/uL — ABNORMAL HIGH (ref 1.7–7.7)
Neutrophils Relative %: 64 %
Platelets: 200 10*3/uL (ref 150–400)
RBC: 4.6 MIL/uL (ref 4.22–5.81)
RDW: 11.9 % (ref 11.5–15.5)
WBC: 20.7 10*3/uL — ABNORMAL HIGH (ref 4.0–10.5)
nRBC: 0.1 % (ref 0.0–0.2)

## 2020-11-17 LAB — BASIC METABOLIC PANEL
Anion gap: 8 (ref 5–15)
BUN: 13 mg/dL (ref 8–23)
CO2: 24 mmol/L (ref 22–32)
Calcium: 9 mg/dL (ref 8.9–10.3)
Chloride: 104 mmol/L (ref 98–111)
Creatinine, Ser: 0.93 mg/dL (ref 0.61–1.24)
GFR, Estimated: >60 mL/min (ref >60)
Glucose, Bld: 147 mg/dL — ABNORMAL HIGH (ref 70–99)
Potassium: 4.4 mmol/L (ref 3.5–5.1)
Sodium: 136 mmol/L (ref 135–145)

## 2020-11-17 MED ORDER — CARVEDILOL 3.125 MG PO TABS
3.1250 mg | ORAL_TABLET | Freq: Two times a day (BID) | ORAL | Status: DC
  Administered 2020-11-18: 3.125 mg via ORAL
  Filled 2020-11-17: qty 1

## 2020-11-17 MED ORDER — GADOBUTROL 1 MMOL/ML IV SOLN
7.5000 mL | Freq: Once | INTRAVENOUS | Status: AC | PRN
  Administered 2020-11-17: 7.5 mL via INTRAVENOUS

## 2020-11-17 NOTE — Consult Note (Addendum)
Cardiology Consultation:   Patient ID: Nathan Hall MRN: [id number]; DOB: 22-Aug-1934  Admit date: 11/13/2020 Date of Consult: 11/17/2020  PCP:  [facility], [place], [place] Prov[facility] Cardiologist:  Jenne Campus, MD  Electrophysiologist:  Will Meredith Leeds, MD       Patient Profile:   Nathan Hall is a 86 y.o. male with a hx of CAD MI, HLD, cardiomyopathy HTN, PVCs suppressed with mexiletine and BB, CLL, GERD who is being seen 11/17/2020 for the evaluation of medications mexiletine and ST at the request of Dr. Zada Finders.  History of Present Illness:   [name] with hx of CAD and last cath 2018 with stents (Xience DES) to Prox LAD and to [facility]. And EF at that time 25-30% at cath.  No angina.  He has hx freq PVCs and feels palpitations.  He was seen by EP 2019 and with bigeminy PVCs, on holter also with ventricular bigeminy with symptoms of fatigue and weakness.  He was placed on mexiletine 150 BID.  He his at that time was back to normal.  In July of last year EF was 40-45%, G1DD.  LA moderately dilated.  Mild to moderate AR.  Last seen by EP 09/2020 and PVCs were greatly improved.  He was euvolemic. No chest pain.   On coreg and lisinopril for CM.    Now admitted 11/13/2020 with speech difficulty and face twitching.  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  CT of chest, abd and pelvis to eval for primary malignancy was negative.  Neurology felt the lesion on MRI was more likely to be a primary brain tumor.  He has been started on Keppra.   Saw neurosurgery and had Rt craniotomy for tumor resection. Today with MRI had some tachycardia.  ST.   His coreg was last given 11/13/20 he has rec'd mexiletine 150 mg BID every day except AM dose yesterday when in OR.  He is back on.   His spironolactone has been held this admit he was on 12.5 daily.  His lasix is at half  usual dose, though he was only on PRN and now daily.   TTE 11/13/20 with EF 35-40% the LV demonstrates RWMA.  G1DD. There is moderate dyskinesis of the left ventricular, basal-mid inferior wall.  Moderate AR, mild MR.   EKG:  The EKG was personally reviewed and demonstrates:  SR with LBBB when previously IVCD no acute ST changes. Todays ST  rate of 115 with no acute ST changes and + LBBB, lead V1 is missing.  Follow up EKG is similar with PACs. Telemetry:  Telemetry was personally reviewed and demonstrates:  SR to ST with PACs occ PVC Na 136, K+ 4.4 BUN 13, Cr 0.93   WBC 20.7 Hgb 15 plts 200  MRI of head today with Status post resection of a right frontal operculum lesion with small volume extra-axial fluid/hemorrhage and pneumocephalus detailed above. Mild (3 mm) leftward midline shift. Small acute infarct along the deep/inferior aspect of the resection cavity  LDL 50, Tchol 99 HDL 40   BP 102/50 to 115/53 P 91 afebrile sp02 on 2L 94%.   No chest pain and no SOB  Past Medical History:  Diagnosis Date   Abnormal nuclear stress test 10/07/2016   Aftercare following surgery 02/22/2018   Cancer ([place])    Cardiomyopathy ([place]) 12/09/2014   Overview:  Ejection fraction 30%  May 2018   Cataract    were removed   Cataract    were removed   CHF (congestive heart failure) (HCC)    CLL (chronic lymphocytic leukemia) ([place])    Coronary artery disease    Coronary artery disease involving native coronary artery of native heart without angina pectoris 12/09/2014   Formatting of this note might be different from the original. LAD and Cx stents 2018 Formatting of this note might be different from the original. PTCA and drug-eluting stent to left anterior descending artery and circumflex artery in May 2018   Dyslipidemia 12/09/2014   Essential hypertension 12/09/2014   GERD (gastroesophageal reflux disease)    Hyperlipidemia    Hypertension    Ingrown nail 02/15/2018   Intermittent lightheadedness 10/13/2016    LV dysfunction 04/11/2017   Pulmonary infiltrates on CXR 01/26/2017   Onset symptoms was late June 2018 and CT scan 12/05/16 c/w as dz LLL/ clear on f/u 01/26/2017  -        PVC's (premature ventricular contractions) 10/07/2016   Rhinitis, chronic 01/27/2017   Allergy profile 01/26/2017 >  Eos 0.1 /  IgE  53 RAST pos dust only     Past Surgical History:  Procedure Laterality Date   APPENDECTOMY     APPLICATION OF CRANIAL NAVIGATION Right 11/16/2020   Procedure: APPLICATION OF CRANIAL NAVIGATION;  Surgeon: Judith Part, MD;  Location: [facility];  Service: Neurosurgery;  Laterality: Right;   BASAL CELL CARCINOMA EXCISION  10/31/2019   cardiac stents     CRANIOTOMY Right 11/16/2020   Procedure: CRANIOTOMY FOR TUMOR EXCISION;  Surgeon: Judith Part, MD;  Location: [facility];  Service: Neurosurgery;  Laterality: Right;   EYE SURGERY     HERNIA REPAIR       Home Medications:  Prior to Admission medications   Medication Sig Start Date End Date Taking? Authorizing Provider  aspirin EC 81 MG tablet Take 81 mg by mouth daily.   Yes [provider]  atorvastatin (LIPITOR) 20 MG tablet TAKE 1 TABLET BY MOUTH EVERY DAY Patient taking differently: Take 20 mg by mouth daily. 11/09/20  Yes Park Liter, MD  busPIRone (BUSPAR) 10 MG tablet Take 10 mg by mouth 2 (two) times daily as needed (anxiety attacks).   Yes [provider]  carvedilol (COREG) 25 MG tablet TAKE 1 TABLET (25 MG TOTAL) BY MOUTH 2 (TWO) TIMES DAILY WITH A MEAL. 06/24/20  Yes Park Liter, MD  Cholecalciferol (VITAMIN D3) 2000 units capsule Take 2,000 Units by mouth daily.   Yes [provider]  CO ENZYME Q-10 PO Take 200 mg by mouth daily.   Yes [provider]  fluticasone (FLONASE) 50 MCG/ACT nasal spray Place 2 sprays into both nostrils daily. 06/03/19  Yes [provider]  furosemide (LASIX) 40 MG tablet Take 40 mg by mouth daily as needed for edema.   Yes [provider]  mexiletine (MEXITIL) 150 MG capsule TAKE 1 CAPSULE BY MOUTH TWICE A DAY Patient taking differently: Take 150 mg by mouth 2 (two) times daily. 06/17/20  Yes Camnitz, Will Hassell Done, MD  multivitamin-lutein [facility]) CAPS capsule Take 1 capsule by mouth daily.   Yes [provider]  nitroGLYCERIN (NITROSTAT) 0.4 MG SL tablet Place 1 tablet under the tongue as needed for chest pain. 10/08/16  Yes [provider]  Omega-3 Fatty Acids (FISH OIL) 1000 MG CAPS Take 1,000 mg by mouth daily.   Yes [provider]  pantoprazole (PROTONIX) 40 MG tablet Take 40 mg by mouth daily.   Yes [provider]  potassium chloride SA (K-DUR,KLOR-CON) 20 MEQ tablet Take 20 mEq by mouth daily.   Yes [provider]  spironolactone (ALDACTONE) 25 MG tablet Take 12.5 mg by mouth daily. 11/10/16  Yes [provider]    Inpatient Medications: Scheduled Meds:  atorvastatin  20 mg Oral Daily   Chlorhexidine Gluconate Cloth  6 each Topical Daily   furosemide  20 mg Oral Daily   levETIRAcetam  500 mg Oral BID   mexiletine  150 mg Oral BID   pantoprazole  40 mg Oral Daily   potassium chloride SA  10 mEq Oral BID   Continuous Infusions:  PRN Meds: acetaminophen **OR** acetaminophen (TYLENOL) oral liquid 160 mg/5 mL **OR** acetaminophen, busPIRone, hydrALAZINE, ondansetron (ZOFRAN) IV, oxyCODONE, oxyCODONE, senna-docusate  Allergies:    Allergies  Allergen Reactions   Lisinopril Itching   Niaspan [Niacin Er] Itching   Simvastatin Itching    Social History:   Social History   Socioeconomic History   Marital status: Married    Spouse name: Not on file   Number of children: Not on file   Years of education: Not on file   Highest education level: Not on file  Occupational History   Not on file  Tobacco Use   Smoking status: Never   Smokeless tobacco: Never   Tobacco comments:    States years ago/did not inhale/brief period/can't remeber  dates  Vaping Use   Vaping Use: Never used  Substance and Sexual Activity   Alcohol use: No   Drug use: No   Sexual activity: Not Currently  Other Topics Concern   Not on file  Social History Narrative   Not on file   Social Determinants of Health   Financial Resource Strain: Not on file  Food Insecurity: Not on file  Transportation Needs: Not on file  Physical Activity: Not on file  Stress: Not on file  Social Connections: Not on file  Intimate Partner Violence: Not on file    Family History:    Family History  Problem Relation Age of Onset   Pneumonia Mother    Stomach cancer Father    Congestive Heart Failure Father    Heart attack Maternal Grandmother    Alzheimer's disease Maternal Grandfather    Cancer Paternal Grandmother    Heart Problems Sister      ROS:  Please see the history of present illness.  General:no colds or fevers, no weight changes Skin:no rashes or ulcers HEENT:no blurred vision, no congestion CV:see HPI PUL:see HPI GI:no diarrhea constipation or melena, no indigestion GU:no hematuria, no dysuria MS:no joint pain, no claudication Neuro:no syncope, no lightheadedness, + repetitive motion, seizure Endo:no diabetes, no thyroid disease  All other ROS reviewed and negative.     Physical Exam/Data:   Vitals:   11/17/20 0900 11/17/20 1000 11/17/20 1100 11/17/20 1200  BP: (!) 103/58 101/61 (!) 102/50 (!) 117/58  Pulse: 89 93 88 91  Resp: 19 16 18 13   Temp:    97.8 F (36.6 C)  TempSrc:    Oral  SpO2: 94% (!) 78% 95% 97%  Weight:      Height:        Intake/Output Summary (Last 24 hours) at 11/17/2020 1254 Last data filed at 11/17/2020 0744 Gross per 24 hour  Intake 900 ml  Output 880 ml  Net 20 ml   Last 3 Weights 11/13/2020 10/12/2020 05/04/2020  Weight (lbs) 171 lb 8.3 oz 171 lb 9.6 oz 170 lb  Weight (kg) 77.8 kg 77.837 kg 77.111 kg     Body mass index is 27.68 kg/m.  General:  Well nourished, well developed, in no acute  distress, no chest pain.  Woke him up HEENT: normal Lymph: no adenopathy Neck: no JVD Endocrine:  No thryomegaly Vascular: No carotid bruits; pedal pulses 2+ bilaterally   Cardiac:  normal S1, S2; RRR; no murmur gallup  Lungs:  clear to auscultation bilaterally, no wheezing, rhonchi or rales  Abd: soft, nontender, no hepatomegaly  Ext: no edema Musculoskeletal:  No deformities, BUE and BLE strength normal and equal Skin: warm and dry  Neuro:  CNs 2-12 intact, no focal abnormalities noted Psych:  Normal affect   Relevant CV Studies: Echo 11/13/20 IMPRESSIONS     1. Left ventricular ejection fraction, by estimation, is 35 to 40%. The  left ventricle has moderately decreased function. The left ventricle  demonstrates regional wall motion abnormalities (see scoring  diagram/findings for description). The left  ventricular internal cavity size was mildly dilated. Left ventricular  diastolic parameters are consistent with Grade I diastolic dysfunction  (impaired relaxation). There is moderate dyskinesis of the left  ventricular, basal-mid inferior wall.   2. Right ventricular systolic function is normal. The right ventricular  size is normal. There is normal pulmonary artery systolic pressure.   3. The mitral valve is grossly normal. Mild mitral valve regurgitation.   4. The aortic valve is tricuspid. Aortic valve regurgitation is moderate.   FINDINGS   Left Ventricle: Left ventricular ejection fraction, by estimation, is 35  to 40%. The left ventricle has moderately decreased function. The left  ventricle demonstrates regional wall motion abnormalities. Moderate  dyskinesis of the left ventricular,  basal-mid inferior wall. The left ventricular internal cavity size was  mildly dilated. There is borderline left ventricular hypertrophy. Left  ventricular diastolic parameters are consistent with Grade I diastolic  dysfunction (impaired relaxation).   Right Ventricle: The right  ventricular size is normal. Right vetricular  wall thickness was not well visualized. Right ventricular systolic  function is normal. There is normal pulmonary artery systolic pressure.  The tricuspid regurgitant velocity is 2.07  m/s, and with an assumed right atrial pressure of 3 mmHg, the estimated  right ventricular systolic pressure is 94.7 mmHg.   Left Atrium: Left atrial size was normal in size.   Right Atrium: Right atrial size was normal in size.   Pericardium: There is no evidence of pericardial effusion.   Mitral Valve: The mitral valve is grossly normal. Mild mitral valve  regurgitation.   Tricuspid Valve: The tricuspid valve is grossly normal. Tricuspid valve  regurgitation is trivial.   Aortic Valve: The aortic valve is tricuspid. Aortic valve regurgitation is  moderate. Aortic regurgitation PHT measures 481 msec.   Pulmonic Valve: The pulmonic valve was grossly normal. Pulmonic valve  regurgitation is mild to moderate.   Aorta: The aortic root was not well visualized.   IAS/Shunts: The atrial septum is grossly normal.       Laboratory Data:  High Sensitivity Troponin:  No results for input(s): TROPONINIHS in the last 720 hours.   Chemistry Recent Labs  Lab 11/14/20 0650 11/15/20 0825 11/17/20 0220  NA 137 137 136  K 3.7 4.7 4.4  CL 105 106 104  CO2 28 26 24   GLUCOSE 130* 104* 147*  BUN 16 11 13   CREATININE 0.99 1.02 0.93  CALCIUM 8.6* 9.1 9.0  GFRNONAA >60 >60 >60  ANIONGAP 4* 5 8    Recent Labs  Lab 11/13/20 0636  PROT 5.6*  ALBUMIN 3.5  AST 27  ALT 19  ALKPHOS 68  BILITOT 1.2   Hematology Recent Labs  Lab 11/14/20 0650 11/15/20 0825 11/17/20 0220  WBC 8.7 10.4 20.7*  RBC 3.96* 4.44 4.60  HGB 12.9* 14.2 15.0  HCT 38.7* 43.5 43.7  MCV 97.7 98.0 95.0  MCH 32.6 32.0 32.6  MCHC 33.3 32.6 34.3  RDW 12.1 12.1 11.9  PLT 148* 178 200   BNPNo results for input(s): BNP, PROBNP in the last 168 hours.  DDimer No results for input(s):  DDIMER in the last 168 hours.   Radiology/Studies:  MR BRAIN WO CONTRAST  Result Date: 11/13/2020 CLINICAL DATA:  Seizure EXAM: MRI HEAD WITHOUT CONTRAST TECHNIQUE: Multiplanar, multiecho pulse sequences of the brain and surrounding structures were obtained without intravenous contrast. COMPARISON:  None. FINDINGS: An abbreviated protocol was used due to patient altered mental status and inability to tolerate the full length of the examination. Diffusion-weighted imaging, axial T2-weighted imaging and sagittal T1-weighted imaging was performed. There is no acute infarct. No midline shift or other mass effect. White matter changes of chronic microvascular ischemia. Generalized volume loss. There is a rounded area of abnormal signal within the right frontal lobe that is slightly volume positive. IMPRESSION: 1. Truncated examination due to patient altered mental status and inability to tolerate the full length of the examination. 2. Within that limitation, there is a rounded area of gyral expansion in the right frontal lobe. Repeat MRI with contrast is recommended when the patient can tolerate it to exclude a neoplastic process. Electronically Signed   By: Ulyses Jarred M.D.   On: 11/13/2020 20:34   MR BRAIN W WO CONTRAST  Result Date: 11/17/2020 CLINICAL DATA:  Brain mass or lesion status post craniotomy for tumor resection. EXAM: MRI HEAD WITHOUT AND WITH CONTRAST TECHNIQUE: Multiplanar, multiecho pulse sequences of the brain and surrounding structures were obtained without and with intravenous contrast. CONTRAST:  7.13mL GADAVIST GADOBUTROL 1 MMOL/ML IV SOLN COMPARISON:  November 14, 2020 MRI. FINDINGS: Brain: Status post resection of a right frontal operculum lesion. Expected postoperative changes including hemorrhage at the resection site and small overlying right convexity extra-axial fluid/hemorrhage, measuring up to 5 mm in thickness posteriorly. There is anti-dependent pneumocephalus overlying the right  frontal convexity (approximately 1.6 cm in thickness) with mild mass effect on the right frontal lobe anteriorly. Edema surrounding the resection cavity is not substantially changed compared to recent prior. There is mild (3 mm) leftward midline shift at the foramen of [place]. Small acute infarct along the deep/inferior aspect of the resection cavity (series 5, images 74/75). No hydrocephalus. Similar additional patchy white matter T2/FLAIR hyperintensities, which are nonspecific but most likely related to chronic microvascular ischemic disease. Vascular: Major arterial flow voids are maintained at the skull base. Skull and upper cervical spine: Right frontal craniotomy. Otherwise, normal marrow signal. Sinuses/Orbits: Opacified ethmoid air cells, sphenoid sinuses, and left frontal sinus. Mild mucosal thickening of the remaining sinuses. Other: No sizable mastoid effusions. IMPRESSION: 1. Status post resection of a right frontal operculum lesion with small volume extra-axial fluid/hemorrhage and pneumocephalus detailed above. Mild (3 mm) leftward midline shift. Small acute infarct along the deep/inferior aspect of the resection cavity. 2. This study will serve as a baseline for future studies. Ill-defined enhancement along the inferior aspect of the resection cavity may represent postoperative change, but warrants attention on the  follow up. 3. Chronic paranasal sinus opacification. Electronically Signed   By: Margaretha Sheffield MD   On: 11/17/2020 07:47   MR BRAIN W WO CONTRAST  Result Date: 11/14/2020 CLINICAL DATA:  86 year old male with seizure like activity. History of hypertension and vascular disease. EXAM: MRI HEAD WITHOUT AND WITH CONTRAST TECHNIQUE: Multiplanar, multiecho pulse sequences of the brain and surrounding structures were obtained without and with intravenous contrast. CONTRAST:  7.44mL GADAVIST GADOBUTROL 1 MMOL/ML IV SOLN COMPARISON:  Truncated brain MRI without contrast 11/13/2020. CT head  and neck that day. FINDINGS: Brain: Solitary round rim enhancing mass measuring up to 17 mm long axis is centered at the right frontal operculum (series 20, image 31) with T2 and FLAIR hyperintensity within the mass and regional white matter T2 and FLAIR hyperintensity, mild gyral edema. Some of the regional cortex is also abnormally T2 and FLAIR hyperintense. But the pattern most resembles vasogenic edema. No associated diffusion changes. No other abnormal intracranial enhancement or similar mass lesion. No restricted diffusion to suggest acute infarction. No midline shift, ventriculomegaly, extra-axial collection or acute intracranial hemorrhage. Cervicomedullary junction and pituitary are within normal limits. Outside of the right frontal operculum there is patchy and widely scattered bilateral nonenhancing cerebral white matter T2 and FLAIR hyperintensity, most resembling chronic small vessel disease. Mild associated T2 heterogeneity in the right caudate nucleus. No cortical encephalomalacia or chronic cerebral blood products are identified. Vascular: Major intracranial vascular flow voids are preserved. The major dural venous sinuses are enhancing and appear to be patent. Skull and upper cervical spine: Negative visible cervical spine and spinal cord. Visualized bone marrow signal is within normal limits. Sinuses/Orbits: Postoperative changes to both globes. Chronic paranasal sinusitis with extensive bilateral ethmoid and sphenoid sinus involvement. Associated mucosal hyperenhancement. No other complicating features. Other: Mastoids and visible internal auditory structures appear negative. Negative visible scalp and face soft tissues. IMPRESSION: 1. Solitary 17 mm rim enhancing mass in the right frontal operculum with regional edema. Favor a solitary Brain Metastasis over other differential considerations such as High-grade Glioma. Recommend follow-up CT Chest, Abdomen, and Pelvis to evaluate for a primary  malignancy. 2. No other acute intracranial abnormality or abnormal enhancement. Additional signal changes in the bilateral white matter and right caudate nucleus most commonly due to chronic small vessel disease. 3. Chronic paranasal sinusitis. No other complicating features. Electronically Signed   By: Genevie Ann M.D.   On: 11/14/2020 11:34   CT CHEST ABDOMEN PELVIS W CONTRAST  Result Date: 11/14/2020 CLINICAL DATA:  Recent MRI showing brain mass, initial encounter EXAM: CT CHEST, ABDOMEN, AND PELVIS WITH CONTRAST TECHNIQUE: Multidetector CT imaging of the chest, abdomen and pelvis was performed following the standard protocol during bolus administration of intravenous contrast. CONTRAST:  153mL OMNIPAQUE IOHEXOL 300 MG/ML  SOLN COMPARISON:  Chest x-ray from the previous day, 12/05/2016. FINDINGS: CT CHEST FINDINGS Cardiovascular: Atherosclerotic calcifications of the thoracic aorta are noted. Coronary calcifications are seen. No cardiac enlargement is noted. Pulmonary artery as visualized is within normal limits. Mediastinum/Nodes: Thoracic inlet is unremarkable. No sizable hilar or mediastinal adenopathy is noted. The esophagus as visualized is within normal limits. Lungs/Pleura: Lungs are well aerated bilaterally. Mild scarring is noted in the right middle lobe. This is stable in appearance from the prior exam. Scattered calcified granulomas are seen. No focal infiltrate, effusion or parenchymal nodule is noted. Mild scarring is noted in the left lower lobe as well. Musculoskeletal: Degenerative changes of the thoracic spine are seen. No rib abnormality is  noted. CT ABDOMEN PELVIS FINDINGS Hepatobiliary: Fatty infiltration of the liver is noted. The gallbladder is within normal limits. Pancreas: Unremarkable. No pancreatic ductal dilatation or surrounding inflammatory changes. Spleen: Normal in size without focal abnormality. Adrenals/Urinary Tract: Adrenal glands are within normal limits bilaterally. Kidneys  demonstrate a normal enhancement pattern. Multiple cysts are noted bilaterally. 1 of the left renal cysts demonstrates some calcification within although the overall appearance is stable measuring 2.4 cm similar to that noted on prior exam. No renal calculi or obstructive changes are seen. Bladder is well distended. Stomach/Bowel: Scattered diverticular change of the colon is noted without evidence of diverticulitis. The appendix is well visualized and within normal limits. Small bowel and stomach are unremarkable. Vascular/Lymphatic: Aortic atherosclerosis. No enlarged abdominal or pelvic lymph nodes. Reproductive: Prostate is enlarged without discrete mass. Other: No abdominal wall hernia or abnormality. No abdominopelvic ascites. Musculoskeletal: Degenerative changes of lumbar spine are noted. No acute abnormality is seen. IMPRESSION: Scarring is noted in the lungs bilaterally stable from the prior study. No focal parenchymal lesion is seen to correspond with the given clinical history of metastatic disease. Fatty liver. Chronic appearing cysts within the kidneys bilaterally. One of the cysts demonstrates some internal calcification but stable from 2018 consistent with a benign etiology. Diverticulosis without diverticulitis. Electronically Signed   By: Inez Catalina M.D.   On: 11/14/2020 19:07     Assessment and Plan:   ST on EKG no longer on coreg could be rebound tachycardia.  If able to add Coreg at lowest dose.   Hx of freq PVCs continue mexilitine this has controlled the PVCs and followed by EP  continue  Hx of CAD and hx of stents to LCX and LAD in 2018 and now with lower EF and Wall motion abnormalities.  No angina. x of CM with EF to 25% at one time had been up to 45-50% now back at 35 to 40%.  Today euvolemic- is on lasix - would resume spironolactone when stable.  Resume coreg at lower dose when BP allows.  3.125 BID will leave to neurosurgery, with his soft BP. Brain tumor with  resection/seizure now on Keppra/await path report.   HTN stable HLD stable continue statin.    Risk Assessment/Risk Scores:    For questions or updates, please contact [place] Please consult [email or web address] for contact info under    Signed, Cecilie Kicks, NP  11/17/2020 12:54 PM   Patient seen and examined. Agree with assessment and plan.  Nathan Hall is an 86 year old gentleman who is followed by Dr. Agustin Cree in our [place] office and Dr. Curt Bears for EP.  The patient has known CAD and has stents in his proximal LAD and mid left circumflex coronary artery.  He has had history of frequent PVCs which have responded to mexiletine antiarrhythmic therapy.  An echo Doppler study in July 2021 showed an EF of 40 to 45% with grade 1 diastolic dysfunction, mild to moderate aortic regurgitation and moderate dilatation of his left atrium.  His most recent echo Doppler study on November 13, 2020 showed an EF of 35 to 90%, grade 1 diastolic dysfunction and moderate dyskinesis of the basal mid inferior wall.  There was moderate AR and mild MR. He had been on carvedilol 25 mg bid and spironolactone 12.5 mg daily and Lasix as needed for  his cardiomyopathy.  He was recently found to have a brain tumor and underwent tumor resection/biopsy yesterday.  At the time they had held his mexiletine and  has been off his carvedilol which had been 25 mg twice a day and off spironolactone.  Mexiletine has been reinstituted.  ECG earlier today showed sinus tachycardia at 115 bpm PAC, and left axis deviation with mild T wave abnormality in leads I and L.  Presently, his heart rate has improved and is now in the 95-99 range and his blood pressure is approximately 108-110/60.  He denies any chest pain.  He denies shortness of breath.  He has not had any recent seizure activity and has been on Keppra.  He is alert and oriented.  There is no JVD.  His lungs are clear.  Rhythm is regular with heart rates in the upper 90s.  There  is a faint 1/6 systolic and diastolic murmur.  There was no ectopy.  Abdomen was soft nontender.  He did not have significant edema.  The patient feels well.  I suspect some of his tachycardia may have been related to rebound following abrupt discontinuance of carvedilol.  We will attempt reinitiation of very low-dose carvedilol with 3.125 mg to give a dose tonight and then twice daily tomorrow.  Continue with his present dose of mexiletine 150 twice daily.  At present we will continue to hold spironolactone.  We will repeat ECG in a.m.  Will follow.                      Troy Sine, MD, [facility] 11/17/2020 3:41 PM

## 2020-11-17 NOTE — Progress Notes (Signed)
Neurosurgery Service Progress Note  Subjective: No acute events overnight, did have some tachycardia while he was at MRI, looks like this admission he's been running HR ~70s, persistently around 100 this morning, on monitor looks pretty convincing to be sinus tach  Objective: Vitals:   11/17/20 0100 11/17/20 0200 11/17/20 0300 11/17/20 0400  BP: 130/68 133/64 111/62   Pulse: (!) 102 (!) 108 (!) 107   Resp: 17 19 15    Temp:    98.4 F (36.9 C)  TempSrc:    Oral  SpO2: 96% 95% 93%   Weight:      Height:        Physical Exam: AOx3, PERRL, EOMI, stabile mild facial asymmetry that has less activation on the left than right (likely pt's baseline), symmetric facial sensation Strength 5/5 x4, SILTx4, no drift  Assessment & Plan: 86 y.o. man w/ L focal facial seizures, MRI w/ R precentral gyrus lesion, CT CAP no other lesions. 6/20 OR for resection, 6/21 MRI w/ GTR  -EKG from this morning not uploaded yet, looks like sinus tach, hemodynamically stable, cards recs pending, on baseline mexilitine & spironolactone, he's unsure why, will hold off on changing his regimen until cards weighs in, in case he has LQTS / etc.  -MRI w/ gross total resection, no concerning findings -path pending, no prelim obtained given small volume of tissue -d/c foley -transfer to stepdown -SCDs/TEDs, hold SQH until tomorrow  Judith Part  11/17/20 7:22 AM

## 2020-11-17 NOTE — Progress Notes (Addendum)
    Hospital day #4 for this 86 year old person admitted with a new focal seizure and found to have a right-sided brain mass which underwent surgical biopsy/resection last night.  Visited the patient this morning in the neuro ICU, he was sitting up in a chair and seemed to be in good spirits.  Having some hospital delirium, but this seems to be improving and would be common during this kind of hospitalization.  Looks to have some sinus tachycardia, no other symptoms to suggest PE, appears about euvolemic, no signs of infection, likely also related to acute stress response after the surgery.  Planned to come out of ICU today to stepdown.  IM teaching service would be happy to take back over as primary if desired by the neurosurgery team.   Axel Filler, MD 11/17/2020, 11:21 AM

## 2020-11-18 MED ORDER — CARVEDILOL 6.25 MG PO TABS
6.2500 mg | ORAL_TABLET | Freq: Two times a day (BID) | ORAL | Status: DC
  Administered 2020-11-18 – 2020-11-19 (×2): 6.25 mg via ORAL
  Filled 2020-11-18: qty 1
  Filled 2020-11-18: qty 2

## 2020-11-18 MED ORDER — HEPARIN SODIUM (PORCINE) 5000 UNIT/ML IJ SOLN
5000.0000 [IU] | Freq: Three times a day (TID) | INTRAMUSCULAR | Status: DC
  Administered 2020-11-18 – 2020-11-19 (×3): 5000 [IU] via SUBCUTANEOUS
  Filled 2020-11-18 (×3): qty 1

## 2020-11-18 NOTE — Progress Notes (Signed)
Neurosurgery Service Progress Note  Subjective: No acute events overnight  Objective: Vitals:   11/18/20 0400 11/18/20 0500 11/18/20 0600 11/18/20 0800  BP: 120/62 122/67 121/60   Pulse: 82 79 94   Resp: 15 17 18    Temp: 99.3 F (37.4 C)   98.5 F (36.9 C)  TempSrc: Oral   Oral  SpO2: (!) 87% 91% 92%   Weight:      Height:        Physical Exam: AOx3, PERRL, EOMI, stabile mild facial asymmetry that has less activation on the left than right (likely pt's baseline), symmetric facial sensation Strength 5/5 x4, SILTx4, no drift  Assessment & Plan: 86 y.o. man w/ L focal facial seizures, MRI w/ R precentral gyrus lesion, CT CAP no other lesions. 6/20 OR for resection, 6/21 MRI w/ GTR  -cards recs, improving w/ restarting home Rx -transfer to floor, will have PT eval for possible discharge tomorrow -SCDs/TEDs, Jackey Loge  11/18/20 11:11 AM

## 2020-11-18 NOTE — Progress Notes (Addendum)
Progress Note  Patient Name: Nathan Hall Date of Encounter: 11/18/2020  [facility] Cardiologist: Jenne Campus, MD   Subjective   No acute overnight events. Patient denies any palpitations, lightheadedness, dizziness. No chest pain or shortness of breath. He is eager to go home.  Inpatient Medications    Scheduled Meds:  atorvastatin  20 mg Oral Daily   carvedilol  3.125 mg Oral BID WC   Chlorhexidine Gluconate Cloth  6 each Topical Daily   furosemide  20 mg Oral Daily   heparin injection (subcutaneous)  5,000 Units Subcutaneous Q8H   levETIRAcetam  500 mg Oral BID   mexiletine  150 mg Oral BID   pantoprazole  40 mg Oral Daily   potassium chloride SA  10 mEq Oral BID   Continuous Infusions:  PRN Meds: acetaminophen **OR** acetaminophen (TYLENOL) oral liquid 160 mg/5 mL **OR** acetaminophen, busPIRone, hydrALAZINE, ondansetron (ZOFRAN) IV, oxyCODONE, oxyCODONE, senna-docusate   Vital Signs    Vitals:   11/18/20 0400 11/18/20 0500 11/18/20 0600 11/18/20 0800  BP: 120/62 122/67 121/60   Pulse: 82 79 94   Resp: 15 17 18    Temp: 99.3 F (37.4 C)   98.5 F (36.9 C)  TempSrc: Oral   Oral  SpO2: (!) 87% 91% 92%   Weight:      Height:        Intake/Output Summary (Last 24 hours) at 11/18/2020 1118 Last data filed at 11/17/2020 1900 Gross per 24 hour  Intake 0 ml  Output 150 ml  Net -150 ml   Last 3 Weights 11/13/2020 10/12/2020 05/04/2020  Weight (lbs) 171 lb 8.3 oz 171 lb 9.6 oz 170 lb  Weight (kg) 77.8 kg 77.837 kg 77.111 kg      Telemetry    Sinus rhythm with rates in the 80s to low 100s. PAC/PVC with some ventricular bigeminy. Some missed beats but no concerning pauses. - Personally Reviewed  ECG    No new ECG tracing today. - Personally Reviewed  Physical Exam   GEN: No acute distress.   Neck: No JVD. Cardiac: RRR. No murmurs, rubs, or gallops.  Respiratory: Clear to auscultation bilaterally. No wheezes, rhonchi, or rales.  GI: Soft,  non-distended, and non-tender. MS: No lower extremity edema. No deformity. Skin: Warm and dry. Neuro:  No focal deficits. Psych: Normal affect. Responds appropriately.  Labs    High Sensitivity Troponin:  No results for input(s): TROPONINIHS in the last 720 hours.    Chemistry Recent Labs  Lab 11/13/20 0636 11/13/20 0644 11/14/20 0650 11/15/20 0825 11/17/20 0220  NA 137   < > 137 137 136  K 4.0   < > 3.7 4.7 4.4  CL 104   < > 105 106 104  CO2 28  --  28 26 24   GLUCOSE 123*   < > 130* 104* 147*  BUN 16   < > 16 11 13   CREATININE 1.10   < > 0.99 1.02 0.93  CALCIUM 9.0  --  8.6* 9.1 9.0  PROT 5.6*  --   --   --   --   ALBUMIN 3.5  --   --   --   --   AST 27  --   --   --   --   ALT 19  --   --   --   --   ALKPHOS 68  --   --   --   --   BILITOT 1.2  --   --   --   --  GFRNONAA >60  --  >60 >60 >60  ANIONGAP 5  --  4* 5 8   < > = values in this interval not displayed.     Hematology Recent Labs  Lab 11/14/20 0650 11/15/20 0825 11/17/20 0220  WBC 8.7 10.4 20.7*  RBC 3.96* 4.44 4.60  HGB 12.9* 14.2 15.0  HCT 38.7* 43.5 43.7  MCV 97.7 98.0 95.0  MCH 32.6 32.0 32.6  MCHC 33.3 32.6 34.3  RDW 12.1 12.1 11.9  PLT 148* 178 200    BNPNo results for input(s): BNP, PROBNP in the last 168 hours.   DDimer No results for input(s): DDIMER in the last 168 hours.   Radiology    MR BRAIN W WO CONTRAST  Result Date: 11/17/2020 CLINICAL DATA:  Brain mass or lesion status post craniotomy for tumor resection. EXAM: MRI HEAD WITHOUT AND WITH CONTRAST TECHNIQUE: Multiplanar, multiecho pulse sequences of the brain and surrounding structures were obtained without and with intravenous contrast. CONTRAST:  7.31mL GADAVIST GADOBUTROL 1 MMOL/ML IV SOLN COMPARISON:  November 14, 2020 MRI. FINDINGS: Brain: Status post resection of a right frontal operculum lesion. Expected postoperative changes including hemorrhage at the resection site and small overlying right convexity extra-axial  fluid/hemorrhage, measuring up to 5 mm in thickness posteriorly. There is anti-dependent pneumocephalus overlying the right frontal convexity (approximately 1.6 cm in thickness) with mild mass effect on the right frontal lobe anteriorly. Edema surrounding the resection cavity is not substantially changed compared to recent prior. There is mild (3 mm) leftward midline shift at the foramen of [place]. Small acute infarct along the deep/inferior aspect of the resection cavity (series 5, images 74/75). No hydrocephalus. Similar additional patchy white matter T2/FLAIR hyperintensities, which are nonspecific but most likely related to chronic microvascular ischemic disease. Vascular: Major arterial flow voids are maintained at the skull base. Skull and upper cervical spine: Right frontal craniotomy. Otherwise, normal marrow signal. Sinuses/Orbits: Opacified ethmoid air cells, sphenoid sinuses, and left frontal sinus. Mild mucosal thickening of the remaining sinuses. Other: No sizable mastoid effusions. IMPRESSION: 1. Status post resection of a right frontal operculum lesion with small volume extra-axial fluid/hemorrhage and pneumocephalus detailed above. Mild (3 mm) leftward midline shift. Small acute infarct along the deep/inferior aspect of the resection cavity. 2. This study will serve as a baseline for future studies. Ill-defined enhancement along the inferior aspect of the resection cavity may represent postoperative change, but warrants attention on the follow up. 3. Chronic paranasal sinus opacification. Electronically Signed   By: Margaretha Sheffield MD   On: 11/17/2020 07:47    Cardiac Studies   Echocardiogram 11/13/2020: Impressions:  1. Left ventricular ejection fraction, by estimation, is 35 to 40%. The  left ventricle has moderately decreased function. The left ventricle  demonstrates regional wall motion abnormalities (see scoring  diagram/findings for description). The left  ventricular internal  cavity size was mildly dilated. Left ventricular  diastolic parameters are consistent with Grade I diastolic dysfunction  (impaired relaxation). There is moderate dyskinesis of the left  ventricular, basal-mid inferior wall.   2. Right ventricular systolic function is normal. The right ventricular  size is normal. There is normal pulmonary artery systolic pressure.   3. The mitral valve is grossly normal. Mild mitral valve regurgitation.   4. The aortic valve is tricuspid. Aortic valve regurgitation is moderate.    Patient Profile     86 y.o. male with a history of CAD s/p DES to LCX and LAD in 2018, ischemic cardiomyopathy/chronic systolic CHF,  PVC suppressed with Mexiletine and beta-blocker, hypertension, hyperlipidemia, GERD, and CLL who was admitted on 11/13/2020 with speech difficulty and face twitching. Treated for seizures. Brain MRI was concerning for solitary brain metastasis. However, Neurology felt the lesion was more likely to be primary brain tumor. He underwent resection/biopsy on 11/16/2020. At that time, his Mexiletine was held and he had been off his Coreg as well to allow for permissive hypertension. He then became tachycardic. Cardiology was consulted for sinus tachycardia and medication recommendation for Mexilitine.  Assessment & Plan    Sinus Tachycardia - Sinus tachycardia is normally a physiologic response. WBC elevated but patient has CLL. Patient afebrile and denies any dysuria, shortness of breath, cough to suggest infection. Patient slightly hypoxic at times with O2 sats in the high 80s; however, he denies any shortness of breath and no RV dysfunction to suggest PE. Likely rebound tachycardia from abrupt discontinuance of Coreg. - Patient was restarted on Coreg and Mexilitin and rates improves. Currently in the 80s to low 100s.  - Continue Coreg 3.125mg  twice daily (was on 25mg  twice daily at home). Will continue current dose given soft BP. - Continue Mexilitine 150mg   twice daily.  History of Frequent PVC - Continue Mexilitine 150mg  twice daily. - Continue Coreg 3.125mg  twice daily.  CAD - S/p DES to LCX and LAD in 2018.  - Echo this admission showed LVEF of 35-40% with moderate dyskinesis of the basal-mid inferior wall. Last Echo in 11/2019 showed LVEF of 40-45% with global hypokinesis.  - No chest pain.  - Continue beta-blocker and statin. - Aspirin was stopped on 6/19 in anticipation of tumor resection on 6/20. Restart when OK with Neurosurgery. - EF slightly down and there are some new documented wall motion abnormalities. However, do not suspect any repeat ischemic evaluation this admission given patient is asymptomatic and new diagnosis of brain tumor.  Ischemic Cardiomyopathy Chronic Systolic CHF - Echo this admission showed LVEF of 35-40% with moderate dyskinesis of the basal-mid inferior wall.  - Appear euvolemic on exam. - Continue Coreg as above. - Home Spironolactone held due to so soft BP. BP better today. Consider restarting if it remains stable - this may have to be done at outpatient follow-up.  - Not on ACEi/ARB at home. - Continue to monitor volume status closely.  Hypertension - BP soft at times but stable. - Continue medication for CHF as above.  Hyperlipidemia - Continue Lipitor 20mg  daily.  Otherwise, per primary team: - Brain tumor - Seizures  For questions or updates, please contact [place] Please consult [email or web address] for contact info under        Signed, Darreld Mclean, PA-C  11/18/2020, 11:18 AM      Patient seen and examined. Agree with assessment and plan. Pt feels well. BP increased to 126/90; HR at 90 with occasional PVCs. Will increase coreg to 6.25 mg bid, and re-assess tomorrow prior to potential DC. (Previous home dose was 25 mg bid).   Troy Sine, MD, [facility] 11/18/2020 2:02 PM

## 2020-11-19 DIAGNOSIS — I10 Essential (primary) hypertension: Secondary | ICD-10-CM

## 2020-11-19 LAB — BASIC METABOLIC PANEL
Anion gap: 8 (ref 5–15)
BUN: 14 mg/dL (ref 8–23)
CO2: 29 mmol/L (ref 22–32)
Calcium: 8.7 mg/dL — ABNORMAL LOW (ref 8.9–10.3)
Chloride: 100 mmol/L (ref 98–111)
Creatinine, Ser: 1.02 mg/dL (ref 0.61–1.24)
GFR, Estimated: >60 mL/min (ref >60)
Glucose, Bld: 108 mg/dL — ABNORMAL HIGH (ref 70–99)
Potassium: 3.7 mmol/L (ref 3.5–5.1)
Sodium: 137 mmol/L (ref 135–145)

## 2020-11-19 MED ORDER — CARVEDILOL 6.25 MG PO TABS: 6.2500 mg | ORAL_TABLET | Freq: Two times a day (BID) | ORAL | 2 refills | Status: DC

## 2020-11-19 MED ORDER — CARVEDILOL 25 MG PO TABS: 12.5000 mg | ORAL_TABLET | Freq: Two times a day (BID) | ORAL | 1 refills | Status: DC

## 2020-11-19 MED ORDER — LEVETIRACETAM 500 MG PO TABS: 500.0000 mg | ORAL_TABLET | Freq: Two times a day (BID) | ORAL | 5 refills | Status: DC

## 2020-11-19 MED ORDER — ASPIRIN EC 81 MG PO TBEC: 81.0000 mg | DELAYED_RELEASE_TABLET | Freq: Every day | ORAL | 11 refills | Status: DC

## 2020-11-19 NOTE — Progress Notes (Signed)
Progress Note  Patient Name: Nathan Hall Date of Encounter: 11/19/2020  [facility] Cardiologist: Jenne Campus, MD   Subjective   Feels well today, anxious to go home.  Inpatient Medications    Scheduled Meds:  atorvastatin  20 mg Oral Daily   carvedilol  6.25 mg Oral BID WC   Chlorhexidine Gluconate Cloth  6 each Topical Daily   furosemide  20 mg Oral Daily   heparin injection (subcutaneous)  5,000 Units Subcutaneous Q8H   levETIRAcetam  500 mg Oral BID   mexiletine  150 mg Oral BID   pantoprazole  40 mg Oral Daily   potassium chloride SA  10 mEq Oral BID   Continuous Infusions:  PRN Meds: acetaminophen **OR** acetaminophen (TYLENOL) oral liquid 160 mg/5 mL **OR** acetaminophen, busPIRone, hydrALAZINE, ondansetron (ZOFRAN) IV, oxyCODONE, oxyCODONE, senna-docusate   Vital Signs    Vitals:   11/18/20 2050 11/18/20 2325 11/19/20 0423 11/19/20 0746  BP: 115/73 (!) 103/45 (!) 104/54 134/70  Pulse: 77 81 85 98  Resp: 17 18 17 18   Temp: 98.1 F (36.7 C) 98.7 F (37.1 C) 99 F (37.2 C) 98.1 F (36.7 C)  TempSrc: Oral Oral Oral Oral  SpO2: 97% 93% 95% 98%  Weight:      Height:        Intake/Output Summary (Last 24 hours) at 11/19/2020 1000 Last data filed at 11/19/2020 0800 Gross per 24 hour  Intake 840 ml  Output 300 ml  Net 540 ml   Last 3 Weights 11/13/2020 10/12/2020 05/04/2020  Weight (lbs) 171 lb 8.3 oz 171 lb 9.6 oz 170 lb  Weight (kg) 77.8 kg 77.837 kg 77.111 kg      Telemetry    Telemetry reveals sinus rhythm in the upper 80s to 90s with occasional PVCs- Personally Reviewed  ECG    No new ECG tracing today. - Personally Reviewed  Physical Exam   GEN: No acute distress.   Neck: No JVD, carotid bruits Cardiac: Rate in the 90s with occasional to frequent PVCs Respiratory: No wheezing or rhonchi GI: Nontender MS: No edema Neuro:  No focal deficits. Psych: Normal affect. Responds appropriately.  Labs    High Sensitivity Troponin:  No  results for input(s): TROPONINIHS in the last 720 hours.    Chemistry Recent Labs  Lab 11/13/20 0636 11/13/20 0644 11/15/20 0825 11/17/20 0220 11/19/20 0232  NA 137   < > 137 136 137  K 4.0   < > 4.7 4.4 3.7  CL 104   < > 106 104 100  CO2 28   < > 26 24 29   GLUCOSE 123*   < > 104* 147* 108*  BUN 16   < > 11 13 14   CREATININE 1.10   < > 1.02 0.93 1.02  CALCIUM 9.0   < > 9.1 9.0 8.7*  PROT 5.6*  --   --   --   --   ALBUMIN 3.5  --   --   --   --   AST 27  --   --   --   --   ALT 19  --   --   --   --   ALKPHOS 68  --   --   --   --   BILITOT 1.2  --   --   --   --   GFRNONAA >60   < > >60 >60 >60  ANIONGAP 5   < > 5 8 8    < > =  values in this interval not displayed.     Hematology Recent Labs  Lab 11/14/20 0650 11/15/20 0825 11/17/20 0220  WBC 8.7 10.4 20.7*  RBC 3.96* 4.44 4.60  HGB 12.9* 14.2 15.0  HCT 38.7* 43.5 43.7  MCV 97.7 98.0 95.0  MCH 32.6 32.0 32.6  MCHC 33.3 32.6 34.3  RDW 12.1 12.1 11.9  PLT 148* 178 200    BNPNo results for input(s): BNP, PROBNP in the last 168 hours.   DDimer No results for input(s): DDIMER in the last 168 hours.   Radiology    No results found.  Cardiac Studies   Echocardiogram 11/13/2020: Impressions:  1. Left ventricular ejection fraction, by estimation, is 35 to 40%. The  left ventricle has moderately decreased function. The left ventricle  demonstrates regional wall motion abnormalities (see scoring  diagram/findings for description). The left  ventricular internal cavity size was mildly dilated. Left ventricular  diastolic parameters are consistent with Grade I diastolic dysfunction  (impaired relaxation). There is moderate dyskinesis of the left  ventricular, basal-mid inferior wall.   2. Right ventricular systolic function is normal. The right ventricular  size is normal. There is normal pulmonary artery systolic pressure.   3. The mitral valve is grossly normal. Mild mitral valve regurgitation.   4. The aortic  valve is tricuspid. Aortic valve regurgitation is moderate.    Patient Profile     86 y.o. male with a history of CAD s/p DES to LCX and LAD in 2018, ischemic cardiomyopathy/chronic systolic CHF, PVC suppressed with Mexiletine and beta-blocker, hypertension, hyperlipidemia, GERD, and CLL who was admitted on 11/13/2020 with speech difficulty and face twitching. Treated for seizures. Brain MRI was concerning for solitary brain metastasis. However, Neurology felt the lesion was more likely to be primary brain tumor. He underwent resection/biopsy on 11/16/2020. At that time, his Mexiletine was held and he had been off his Coreg as well to allow for permissive hypertension. He then became tachycardic. Cardiology was consulted for sinus tachycardia and medication recommendation for Mexilitine.  Assessment & Plan    Sinus Tachycardia -Initial rebound tachycardia following abrupt discontinuance of carvedilol for several days.  He has been on mexiletine.  Carvedilol was reinstituted at 3.125 mg for 2 doses and yesterday further titrated to 6.25 mg twice a day.  Telemetry continues to show sinus rhythm in the upper 80s to 90s with occasional PVCs.  Patient is for discharge today.  I have recommended the patient further increase his carvedilol to 12.5 mg twice a day.  Previous home dose was 25 mg twice daily and he was instructed to take half a pill twice daily.  He will be following up with Dr. Agustin Cree and Dr. Curt Bears.  Okay to discharge from cardiology standpoint  History of Frequent PVC - Continue Mexilitine 150mg  twice daily. -Increase carvedilol to 12.5 mg twice daily  CAD - S/p DES to LCX and LAD in 2018.  - Echo this admission showed LVEF of 35-40% with moderate dyskinesis of the basal-mid inferior wall. Last Echo in 11/2019 showed LVEF of 40-45% with global hypokinesis.  - No chest pain.  - Continue beta-blocker and statin. - Aspirin was stopped on 6/19 in anticipation of tumor resection on 6/20.  Restart when OK with Neurosurgery. - EF slightly down and there are some new documented wall motion abnormalities. However, do not suspect any repeat ischemic evaluation this admission given patient is asymptomatic and new diagnosis of brain tumor.  Ischemic Cardiomyopathy Chronic Systolic CHF - Echo this  admission showed LVEF of 35-40% with moderate dyskinesis of the basal-mid inferior wall.  - Appear euvolemic on exam. - Continue Coreg as above. - Home Spironolactone held due to so soft BP. BP better today. Consider restarting if it remains stable - this may have to be done at outpatient follow-up.  - Not on ACEi/ARB at home. - Continue to monitor volume status closely.  Hypertension - BP soft at times but stable. - Continue medication for CHF as above.  Hyperlipidemia - Continue Lipitor 20mg  daily.  Otherwise, per primary team: - Brain tumor - Seizures  For questions or updates, please contact [place] Please consult [email or web address] for contact info under        Signed, Shelva Majestic, MD  11/19/2020, 10:00 AM

## 2020-11-19 NOTE — Plan of Care (Signed)
Problem: Education: Goal: Knowledge of General Education information will improve Description: Including pain rating scale, medication(s)/side effects and non-pharmacologic comfort measures Outcome: Adequate for Discharge   Problem: Health Behavior/Discharge Planning: Goal: Ability to manage health-related needs will improve Outcome: Adequate for Discharge   Problem: Clinical Measurements: Goal: Ability to maintain clinical measurements within normal limits will improve Outcome: Adequate for Discharge Goal: Will remain free from infection Outcome: Adequate for Discharge Goal: Diagnostic test results will improve Outcome: Adequate for Discharge Goal: Respiratory complications will improve Outcome: Adequate for Discharge Goal: Cardiovascular complication will be avoided Outcome: Adequate for Discharge   Problem: Activity: Goal: Risk for activity intolerance will decrease Outcome: Adequate for Discharge   Problem: Nutrition: Goal: Adequate nutrition will be maintained Outcome: Adequate for Discharge   Problem: Coping: Goal: Level of anxiety will decrease Outcome: Adequate for Discharge   Problem: Elimination: Goal: Will not experience complications related to bowel motility Outcome: Adequate for Discharge Goal: Will not experience complications related to urinary retention Outcome: Adequate for Discharge   Problem: Pain Managment: Goal: General experience of comfort will improve Outcome: Adequate for Discharge   Problem: Safety: Goal: Ability to remain free from injury will improve Outcome: Adequate for Discharge   Problem: Education: Goal: Knowledge of General Education information will improve Description: Including pain rating scale, medication(s)/side effects and non-pharmacologic comfort measures Outcome: Adequate for Discharge   Problem: Health Behavior/Discharge Planning: Goal: Ability to manage health-related needs will improve Outcome: Adequate for  Discharge   Problem: Clinical Measurements: Goal: Ability to maintain clinical measurements within normal limits will improve Outcome: Adequate for Discharge Goal: Will remain free from infection Outcome: Adequate for Discharge Goal: Diagnostic test results will improve Outcome: Adequate for Discharge Goal: Respiratory complications will improve Outcome: Adequate for Discharge Goal: Cardiovascular complication will be avoided Outcome: Adequate for Discharge   Problem: Activity: Goal: Risk for activity intolerance will decrease Outcome: Adequate for Discharge   Problem: Nutrition: Goal: Adequate nutrition will be maintained Outcome: Adequate for Discharge   Problem: Coping: Goal: Level of anxiety will decrease Outcome: Adequate for Discharge   Problem: Elimination: Goal: Will not experience complications related to bowel motility Outcome: Adequate for Discharge Goal: Will not experience complications related to urinary retention Outcome: Adequate for Discharge   Problem: Pain Managment: Goal: General experience of comfort will improve Outcome: Adequate for Discharge   Problem: Safety: Goal: Ability to remain free from injury will improve Outcome: Adequate for Discharge   Problem: Skin Integrity: Goal: Risk for impaired skin integrity will decrease Outcome: Adequate for Discharge   Problem: Education: Goal: Knowledge of General Education information will improve Description: Including pain rating scale, medication(s)/side effects and non-pharmacologic comfort measures Outcome: Adequate for Discharge   Problem: Health Behavior/Discharge Planning: Goal: Ability to manage health-related needs will improve Outcome: Adequate for Discharge   Problem: Clinical Measurements: Goal: Ability to maintain clinical measurements within normal limits will improve Outcome: Adequate for Discharge Goal: Will remain free from infection Outcome: Adequate for Discharge Goal:  Diagnostic test results will improve Outcome: Adequate for Discharge Goal: Respiratory complications will improve Outcome: Adequate for Discharge Goal: Cardiovascular complication will be avoided Outcome: Adequate for Discharge   Problem: Activity: Goal: Risk for activity intolerance will decrease Outcome: Adequate for Discharge   Problem: Nutrition: Goal: Adequate nutrition will be maintained Outcome: Adequate for Discharge   Problem: Coping: Goal: Level of anxiety will decrease Outcome: Adequate for Discharge   Problem: Elimination: Goal: Will not experience complications related to bowel motility Outcome: Adequate for  Discharge Goal: Will not experience complications related to urinary retention Outcome: Adequate for Discharge   Problem: Pain Managment: Goal: General experience of comfort will improve Outcome: Adequate for Discharge   Problem: Safety: Goal: Ability to remain free from injury will improve Outcome: Adequate for Discharge   Problem: Skin Integrity: Goal: Risk for impaired skin integrity will decrease Outcome: Adequate for Discharge   Problem: Education: Goal: Ability to verbalize activity precautions or restrictions will improve Outcome: Adequate for Discharge Goal: Knowledge of the prescribed therapeutic regimen will improve Outcome: Adequate for Discharge Goal: Understanding of discharge needs will improve Outcome: Adequate for Discharge

## 2020-11-19 NOTE — Care Management Important Message (Signed)
Important Message  Patient Details  Name: Nathan Hall MRN: [id number] Date of Birth: Oct 07, 1934   Medicare Important Message Given:  Yes     Mazie Fencl P Aniwa 11/19/2020, 3:11 PM

## 2020-11-19 NOTE — Discharge Instructions (Signed)
Discharge Instructions  No restriction in activities, slowly increase your activity back to normal.   Your incision is closed with dermabond (purple glue). This will naturally fall off over the next 1-2 weeks.   Okay to shower on the day of discharge. Use regular soap and water and try to be gentle when cleaning your incision.   Follow up with Dr. Zada Finders in 2 weeks after discharge. If you do not already have a discharge appointment, please call his office at [phone] to schedule a follow up appointment. If you have any concerns or questions, please call the office and let us know.  Set up an appointment with your cardiologist to discuss your hospitalization and so they can titrate your carvedilol back up to your normal dose.

## 2020-11-19 NOTE — TOC Transition Note (Signed)
Transition of Care [facility]) - CM/SW Discharge Note   Patient Details  Name: Nathan Hall MRN: [id number] Date of Birth: 04/10/1935  [facility] CM/SW Contact:  Pollie Friar, RN Phone Number: 11/19/2020, 10:19 AM   Clinical Narrative:    Patient is discharging home with spouse. No needs per TOC.   Final next level of care: Home/Self Care Barriers to Discharge: No Barriers Identified   Patient Goals and CMS Choice        Discharge Placement                       Discharge Plan and Services                                     Social Determinants of Health (SDOH) Interventions     Readmission Risk Interventions No flowsheet data found.

## 2020-11-19 NOTE — Progress Notes (Signed)
Patient dc with wife and granddaughter. Patient informed of medication administration at home. Home meds returned to patient.

## 2020-11-19 NOTE — Progress Notes (Signed)
Neurosurgery Service Progress Note  Subjective: No acute events overnight, no new complaints this morning, doing well  Objective: Vitals:   11/18/20 2050 11/18/20 2325 11/19/20 0423 11/19/20 0746  BP: 115/73 (!) 103/45 (!) 104/54 134/70  Pulse: 77 81 85 98  Resp: 17 18 17 18   Temp: 98.1 F (36.7 C) 98.7 F (37.1 C) 99 F (37.2 C) 98.1 F (36.7 C)  TempSrc: Oral Oral Oral Oral  SpO2: 97% 93% 95% 98%  Weight:      Height:        Physical Exam: AOx3, PERRL, EOMI, facial asymmetry at pt's baseline, symmetric facial sensation Strength 5/5 x4, SILTx4, no drift  Assessment & Plan: 86 y.o. man w/ L focal facial seizures, MRI w/ R precentral gyrus lesion, CT CAP no other lesions. 6/20 OR for resection, 6/21 MRI w/ GTR  -discharge home today  Judith Part  11/19/20 9:52 AM

## 2020-11-19 NOTE — Discharge Summary (Signed)
Discharge Summary  Date of Admission: 11/13/2020  Date of Discharge: 11/19/20  Attending Physician: Emelda Brothers, MD  Hospital Course: Patient presented with new onset left facial focal seizures and was found to have a right precentral gyrus lesion without primary on CT CAP. He was taken to the OR on 4/19 for an uncomplicated craniotomy and resection of the mass. He was recovered in PACU and transferred to 4N ICU.  A post-op MRI showed gross total resection without other concerning findings. His hospital course was only notable for some tachycardia, likely due to holding his carvedilol and mexilitine due to some soft blood pressures, cardiology was consulted and restarted them slowly. His stay was otherwise uncomplicated, no further seizures after starting 500mg  of keppra bid, and the patient was discharged home on 11/19/20. He will follow up in clinic with me in 2 weeks. Final path was still pending at the time of discharge..  Neurologic exam at discharge:  AOx3, PERRL, EOMI, face with some right sided asymmetry - pt's baseline, TM Strength 5/5 x4, SILTx4, no drift  Discharge diagnosis: Brain tumor  Judith Part, MD 11/19/20 9:57 AM

## 2020-11-20 LAB — SURGICAL PATHOLOGY

## 2020-11-21 ENCOUNTER — Telehealth: Payer: Self-pay | Admitting: Physician Assistant

## 2020-11-21 NOTE — Telephone Encounter (Addendum)
86 year old male with CAD, dilated cardiomyopathy, PVCs.  He has been managed on a combination of mexiletine and carvedilol.  He was recently admitted with seizures and diagnosed with glioblastoma.  He underwent resection of the tumor and was followed by cardiology.  His mexiletine and carvedilol have been held and he had sinus tachycardia.  Medications were resumed prior to discharge.  He is currently on carvedilol 12.5 mg twice daily.  He called the answering service because he was seeing heart rates in the 40s on his pulse oximeter.  He has felt fatigued/sleepy.  Of note, he was started on Keppra for seizures which can cause somnolence.  He has not had syncope, near syncope, chest pain, shortness of breath.  While I was on the phone with him, his pulse oximeter began to register heart rates in the 80s and 90s.  Blood pressure was 113/58.  PLAN: I have advised him to go to the emergency room if he has syncope, near syncope, chest pain or shortness of breath.  He should also go the emergency room if he records heart rates in the 30s-40s and also feels poorly.  Otherwise, I will bring him into the office on Monday 6/27 for follow-up EKG.  Richardson Dopp, PA-C    11/21/2020 9:42 AM

## 2020-11-23 NOTE — Telephone Encounter (Signed)
Spoke to patient and patient wife. Discussed arranging a EKG for today. He reports he feels better now, blood pressure 115/61 and heart rate 81. He doesn't feel he needs to have ekf today he is feeling better. Advised him to call back if he starts to feel worse or heart rate drops again. He understood.

## 2020-11-26 DIAGNOSIS — K219 Gastro-esophageal reflux disease without esophagitis: Secondary | ICD-10-CM | POA: Diagnosis not present

## 2020-11-26 DIAGNOSIS — I10 Essential (primary) hypertension: Secondary | ICD-10-CM | POA: Diagnosis not present

## 2020-11-26 DIAGNOSIS — I420 Dilated cardiomyopathy: Secondary | ICD-10-CM | POA: Diagnosis not present

## 2020-11-26 DIAGNOSIS — F411 Generalized anxiety disorder: Secondary | ICD-10-CM | POA: Diagnosis not present

## 2020-11-26 NOTE — Progress Notes (Signed)
Location/Histology of Brain Tumor: Right Frontal Lobe GBM  Patient presented with seizures affecting his left side.  MRI Brain 11/17/2020: Status post resection of a right frontal operculum lesion with small volume extra-axial fluid/hemorrhage and pneumocephalus detailed above. Mild (3 mm) leftward midline shift. Small acute infarct along the deep/inferior aspect of the resection cavity.  This study will serve as a baseline for future studies. Ill-defined enhancement along the inferior aspect of the resection cavity may represent postoperative change, but warrants attention on the follow up.  CT AP 11/14/2020: Scarring is noted in the lungs bilaterally stable from the prior study.  No focal parenchymal lesion is seen to correspond with the given clinical history of metastatic disease.  MRI Brain 11/14/2020: Solitary 17 mm rim enhancing mass in the right frontal operculum with regional edema.  Favor a solitary Brain Metastasis over other differential considerations such as High-grade Glioma.  MRI Brain 11/13/2020: Truncated examination due to patient altered mental status and inability to tolerate the full length of the examination.  Within that limitation, there is a rounded area of gyral expansion in the right frontal lobe.  CT Head 11/13/2020: there is possible mild cortical cytotoxic edema at the frontal operculum  Past or anticipated interventions, if any, per neurosurgery:  Dr. Zada Finders - Right Craniotomy for tumor excision 11/16/2020    Past or anticipated interventions, if any, per medical oncology:  Dr. Mickeal Skinner 12/01/2020 No definitive treatment plan has been made.  Dose of Decadron, if applicable: None  Recent neurologic symptoms, if any:  Seizures:  Headaches: Has had a few instances since his surgery on 11/16/2020.  They have been relieved by tylenol. Nausea: No Dizziness/ataxia: Has some gait instability.  He notes some stumbling. Difficulty with hand coordination: None  noted. Focal numbness/weakness: No Visual deficits/changes: None noted.  Wears glasses at baseline. Confusion/Memory deficits:    SAFETY ISSUES: Prior radiation? No Pacemaker/ICD? No Possible current pregnancy? N/a Is the patient on methotrexate? No  Additional Complaints / other details:

## 2020-12-01 ENCOUNTER — Encounter: Payer: Self-pay | Admitting: Radiation Oncology

## 2020-12-01 ENCOUNTER — Inpatient Hospital Stay: Payer: PPO | Attending: Internal Medicine | Admitting: Internal Medicine

## 2020-12-01 ENCOUNTER — Ambulatory Visit
Admission: RE | Admit: 2020-12-01 | Discharge: 2020-12-01 | Disposition: A | Discharge status: Home / Self Care | Payer: PPO | Source: Ambulatory Visit | Attending: Radiation Oncology | Admitting: Radiation Oncology

## 2020-12-01 ENCOUNTER — Other Ambulatory Visit: Payer: Self-pay

## 2020-12-01 VITALS — BP 132/62 | HR 74 | Temp 98.1°F | Resp 18 | Ht 66.0 in | Wt 166.1 lb

## 2020-12-01 VITALS — BP 142/79 | HR 82 | Temp 97.1°F | Resp 18 | Ht 66.0 in | Wt 165.5 lb

## 2020-12-01 DIAGNOSIS — Z23 Encounter for immunization: Secondary | ICD-10-CM | POA: Insufficient documentation

## 2020-12-01 DIAGNOSIS — I2510 Atherosclerotic heart disease of native coronary artery without angina pectoris: Secondary | ICD-10-CM | POA: Diagnosis not present

## 2020-12-01 DIAGNOSIS — E785 Hyperlipidemia, unspecified: Secondary | ICD-10-CM | POA: Insufficient documentation

## 2020-12-01 DIAGNOSIS — C711 Malignant neoplasm of frontal lobe: Secondary | ICD-10-CM

## 2020-12-01 DIAGNOSIS — I10 Essential (primary) hypertension: Secondary | ICD-10-CM | POA: Diagnosis not present

## 2020-12-01 DIAGNOSIS — Z79899 Other long term (current) drug therapy: Secondary | ICD-10-CM | POA: Insufficient documentation

## 2020-12-01 DIAGNOSIS — Z7189 Other specified counseling: Secondary | ICD-10-CM

## 2020-12-01 DIAGNOSIS — M47814 Spondylosis without myelopathy or radiculopathy, thoracic region: Secondary | ICD-10-CM | POA: Insufficient documentation

## 2020-12-01 DIAGNOSIS — I509 Heart failure, unspecified: Secondary | ICD-10-CM | POA: Insufficient documentation

## 2020-12-01 DIAGNOSIS — R569 Unspecified convulsions: Secondary | ICD-10-CM | POA: Insufficient documentation

## 2020-12-01 DIAGNOSIS — I083 Combined rheumatic disorders of mitral, aortic and tricuspid valves: Secondary | ICD-10-CM | POA: Insufficient documentation

## 2020-12-01 DIAGNOSIS — R4182 Altered mental status, unspecified: Secondary | ICD-10-CM | POA: Diagnosis not present

## 2020-12-01 DIAGNOSIS — C712 Malignant neoplasm of temporal lobe: Secondary | ICD-10-CM | POA: Insufficient documentation

## 2020-12-01 DIAGNOSIS — C719 Malignant neoplasm of brain, unspecified: Secondary | ICD-10-CM

## 2020-12-01 DIAGNOSIS — I6782 Cerebral ischemia: Secondary | ICD-10-CM | POA: Diagnosis not present

## 2020-12-01 DIAGNOSIS — K219 Gastro-esophageal reflux disease without esophagitis: Secondary | ICD-10-CM | POA: Insufficient documentation

## 2020-12-01 DIAGNOSIS — C9111 Chronic lymphocytic leukemia of B-cell type in remission: Secondary | ICD-10-CM | POA: Insufficient documentation

## 2020-12-01 DIAGNOSIS — Z809 Family history of malignant neoplasm, unspecified: Secondary | ICD-10-CM | POA: Insufficient documentation

## 2020-12-01 DIAGNOSIS — I700 Atherosclerosis of aorta: Secondary | ICD-10-CM | POA: Insufficient documentation

## 2020-12-01 NOTE — Progress Notes (Signed)
[facility] [place], [place] [address] [phone]   New Patient Evaluation  Date of Service: 12/01/20 Patient Name: Nathan Hall Patient MRN: [id number] Patient DOB: 17-Jan-1935 Provider: Ventura Sellers, MD  Identifying Statement:  Nathan Hall is a 86 y.o. male with right temporal glioblastoma who presents for initial consultation and evaluation.    Referring Provider: Judith Part, MD [place],  [place] [address]  Oncologic History: Oncology History  Glioblastoma [facility])  11/16/2020 Surgery   Craniotomy, resection of R temporal mass by Dr. Jearl Klinefelter; path demonstrates glioblastoma     Biomarkers:  MGMT Unknown.  IDH 1/2 Unknown.  EGFR Unknown  TERT Unknown   History of Present Illness: The patient's records from the referring physician were obtained and reviewed and the patient interviewed to confirm this HPI.  Nathan Hall presented to medical attention on 11/13/20 with first ever seizure.  He describes involuntary nocturnal twitching of the left side of his face accompanied by slurred speech.  This resolved prior to arriving at the ED later the next morning, more or less at his baseline.  CNS imaging demonstrated enhancing mass in the right temporal lobe consistent with tumor, this was resected by Dr. Zada Finders on 11/16/20; path was glioblastoma.  Today he feels more or less at baseline, but complains of excess fatigue compared to prior to surgery.  He likes to cut grass on his riding mower.  Medications: Current Outpatient Medications on File Prior to Visit  Medication Sig Dispense Refill   aspirin EC 81 MG tablet Take 1 tablet (81 mg total) by mouth daily. Restart on 11/23/20 30 tablet 11   atorvastatin (LIPITOR) 20 MG tablet TAKE 1 TABLET BY MOUTH EVERY DAY 90 tablet 1   busPIRone (BUSPAR) 10 MG tablet Take 10 mg by mouth 2 (two) times daily as needed (anxiety attacks).     carvedilol (COREG)  25 MG tablet Take 0.5 tablets (12.5 mg total) by mouth 2 (two) times daily with a meal. 180 tablet 1   Cholecalciferol (VITAMIN D3) 2000 units capsule Take 2,000 Units by mouth daily.     CO ENZYME Q-10 PO Take 200 mg by mouth daily.     fluticasone (FLONASE) 50 MCG/ACT nasal spray Place 2 sprays into both nostrils daily.     furosemide (LASIX) 40 MG tablet Take 40 mg by mouth daily as needed for edema.     levETIRAcetam (KEPPRA) 500 MG tablet Take 1 tablet (500 mg total) by mouth 2 (two) times daily. 60 tablet 5   mexiletine (MEXITIL) 150 MG capsule TAKE 1 CAPSULE BY MOUTH TWICE A DAY 180 capsule 2   multivitamin-lutein (OCUVITE-LUTEIN) CAPS capsule Take 1 capsule by mouth daily.     Omega-3 Fatty Acids (FISH OIL) 1000 MG CAPS Take 1,000 mg by mouth daily.     pantoprazole (PROTONIX) 40 MG tablet Take 40 mg by mouth daily.     potassium chloride SA (K-DUR,KLOR-CON) 20 MEQ tablet Take 20 mEq by mouth daily.     spironolactone (ALDACTONE) 25 MG tablet Take 12.5 mg by mouth daily.     nitroGLYCERIN (NITROSTAT) 0.4 MG SL tablet Place 1 tablet under the tongue as needed for chest pain. (Patient not taking: Reported on 12/01/2020)     No current facility-administered medications on file prior to visit.    Allergies:  Allergies  Allergen Reactions   Lisinopril Itching   Niaspan [Niacin Er] Itching  Simvastatin Itching   Past Medical History:  Past Medical History:  Diagnosis Date   Abnormal nuclear stress test 10/07/2016   Aftercare following surgery 02/22/2018   Cancer ([facility])    Cardiomyopathy ([facility]) 12/09/2014   Overview:  Ejection fraction 30% May 2018   Cataract    were removed   Cataract    were removed   CHF (congestive heart failure) (HCC)    CLL (chronic lymphocytic leukemia) ([facility])    Coronary artery disease    Coronary artery disease involving native coronary artery of native heart without angina pectoris 12/09/2014   Formatting of this note might be different from the original.  LAD and Cx stents 2018 Formatting of this note might be different from the original. PTCA and drug-eluting stent to left anterior descending artery and circumflex artery in May 2018   Dyslipidemia 12/09/2014   Essential hypertension 12/09/2014   GERD (gastroesophageal reflux disease)    Hyperlipidemia    Hypertension    Ingrown nail 02/15/2018   Intermittent lightheadedness 10/13/2016   LV dysfunction 04/11/2017   Pulmonary infiltrates on CXR 01/26/2017   Onset symptoms was late June 2018 and CT scan 12/05/16 c/w as dz LLL/ clear on f/u 01/26/2017  -        PVC's (premature ventricular contractions) 10/07/2016   Rhinitis, chronic 01/27/2017   Allergy profile 01/26/2017 >  Eos 0.1 /  IgE  53 RAST pos dust only    Past Surgical History:  Past Surgical History:  Procedure Laterality Date   APPENDECTOMY     APPLICATION OF CRANIAL NAVIGATION Right 11/16/2020   Procedure: APPLICATION OF CRANIAL NAVIGATION;  Surgeon: Judith Part, MD;  Location: [facility];  Service: Neurosurgery;  Laterality: Right;   BASAL CELL CARCINOMA EXCISION  10/31/2019   cardiac stents     CRANIOTOMY Right 11/16/2020   Procedure: CRANIOTOMY FOR TUMOR EXCISION;  Surgeon: Judith Part, MD;  Location: [facility];  Service: Neurosurgery;  Laterality: Right;   EYE SURGERY     HERNIA REPAIR     Social History:  Social History   Socioeconomic History   Marital status: Married    Spouse name: Not on file   Number of children: Not on file   Years of education: Not on file   Highest education level: Not on file  Occupational History   Not on file  Tobacco Use   Smoking status: Never   Smokeless tobacco: Never   Tobacco comments:    States years ago/did not inhale/brief period/can't remeber dates  Vaping Use   Vaping Use: Never used  Substance and Sexual Activity   Alcohol use: No   Drug use: No   Sexual activity: Not Currently  Other Topics Concern   Not on file  Social History Narrative   Not on file   Social  Determinants of Health   Financial Resource Strain: Not on file  Food Insecurity: Not on file  Transportation Needs: Not on file  Physical Activity: Not on file  Stress: Not on file  Social Connections: Not on file  Intimate Partner Violence: Not on file   Family History:  Family History  Problem Relation Age of Onset   Pneumonia Mother    Stomach cancer Father    Congestive Heart Failure Father    Heart attack Maternal Grandmother    Alzheimer's disease Maternal Grandfather    Cancer Paternal Grandmother    Heart Problems Sister     Review of Systems: Constitutional: Doesn't report fevers,  chills or abnormal weight loss Eyes: Doesn't report blurriness of vision Ears, nose, mouth, throat, and face: Doesn't report sore throat Respiratory: Doesn't report cough, dyspnea or wheezes Cardiovascular: Doesn't report palpitation, chest discomfort  Gastrointestinal:  Doesn't report nausea, constipation, diarrhea GU: Doesn't report incontinence Skin: Doesn't report skin rashes Neurological: Per HPI Musculoskeletal: Doesn't report joint pain Behavioral/Psych: Doesn't report anxiety  Physical Exam: Vitals:   12/01/20 0903  BP: 132/62  Pulse: 74  Resp: 18  Temp: 98.1 F (36.7 C)  SpO2: 99%   KPS: 80. General: Alert, cooperative, pleasant, in no acute distress Head: Normal EENT: No conjunctival injection or scleral icterus.  Lungs: Resp effort normal Cardiac: Regular rate Abdomen: Non-distended abdomen Skin: No rashes cyanosis or petechiae. Extremities: No clubbing or edema  Neurologic Exam: Mental Status: Awake, alert, attentive to examiner. Oriented to self and environment. Language is fluent with intact comprehension.  Cranial Nerves: Visual acuity is grossly normal. Visual fields are full. Extra-ocular movements intact. No ptosis. Face is symmetric Motor: Tone and bulk are normal. Power is full in both arms and legs. Reflexes are symmetric, no pathologic reflexes  present.  Sensory: Intact to light touch Gait: Normal.   Labs: I have reviewed the data as listed    Component Value Date/Time   NA 137 11/19/2020 0232   NA 137 05/04/2020 0000   K 3.7 11/19/2020 0232   CL 100 11/19/2020 0232   CO2 29 11/19/2020 0232   GLUCOSE 108 (H) 11/19/2020 0232   BUN 14 11/19/2020 0232   BUN 16 05/04/2020 0000   CREATININE 1.02 11/19/2020 0232   CALCIUM 8.7 (L) 11/19/2020 0232   PROT 5.6 (L) 11/13/2020 0636   PROT 6.0 03/29/2018 1103   ALBUMIN 3.5 11/13/2020 0636   ALBUMIN 4.1 03/29/2018 1103   AST 27 11/13/2020 0636   ALT 19 11/13/2020 0636   ALKPHOS 68 11/13/2020 0636   BILITOT 1.2 11/13/2020 0636   BILITOT 0.5 03/29/2018 1103   GFRNONAA >60 11/19/2020 0232   GFRAA 63 05/04/2020 0000   Lab Results  Component Value Date   WBC 20.7 (H) 11/17/2020   NEUTROABS 13.2 (H) 11/17/2020   HGB 15.0 11/17/2020   HCT 43.7 11/17/2020   MCV 95.0 11/17/2020   PLT 200 11/17/2020    Imaging:  MR BRAIN WO CONTRAST  Result Date: 11/13/2020 CLINICAL DATA:  Seizure EXAM: MRI HEAD WITHOUT CONTRAST TECHNIQUE: Multiplanar, multiecho pulse sequences of the brain and surrounding structures were obtained without intravenous contrast. COMPARISON:  None. FINDINGS: An abbreviated protocol was used due to patient altered mental status and inability to tolerate the full length of the examination. Diffusion-weighted imaging, axial T2-weighted imaging and sagittal T1-weighted imaging was performed. There is no acute infarct. No midline shift or other mass effect. White matter changes of chronic microvascular ischemia. Generalized volume loss. There is a rounded area of abnormal signal within the right frontal lobe that is slightly volume positive. IMPRESSION: 1. Truncated examination due to patient altered mental status and inability to tolerate the full length of the examination. 2. Within that limitation, there is a rounded area of gyral expansion in the right frontal lobe. Repeat  MRI with contrast is recommended when the patient can tolerate it to exclude a neoplastic process. Electronically Signed   By: Ulyses Jarred M.D.   On: 11/13/2020 20:34   MR BRAIN W WO CONTRAST  Result Date: 11/17/2020 CLINICAL DATA:  Brain mass or lesion status post craniotomy for tumor resection. EXAM: MRI HEAD WITHOUT AND  WITH CONTRAST TECHNIQUE: Multiplanar, multiecho pulse sequences of the brain and surrounding structures were obtained without and with intravenous contrast. CONTRAST:  7.63m GADAVIST GADOBUTROL 1 MMOL/ML IV SOLN COMPARISON:  November 14, 2020 MRI. FINDINGS: Brain: Status post resection of a right frontal operculum lesion. Expected postoperative changes including hemorrhage at the resection site and small overlying right convexity extra-axial fluid/hemorrhage, measuring up to 5 mm in thickness posteriorly. There is anti-dependent pneumocephalus overlying the right frontal convexity (approximately 1.6 cm in thickness) with mild mass effect on the right frontal lobe anteriorly. Edema surrounding the resection cavity is not substantially changed compared to recent prior. There is mild (3 mm) leftward midline shift at the foramen of [place] Small acute infarct along the deep/inferior aspect of the resection cavity (series 5, images 74/75). No hydrocephalus. Similar additional patchy white matter T2/FLAIR hyperintensities, which are nonspecific but most likely related to chronic microvascular ischemic disease. Vascular: Major arterial flow voids are maintained at the skull base. Skull and upper cervical spine: Right frontal craniotomy. Otherwise, normal marrow signal. Sinuses/Orbits: Opacified ethmoid air cells, sphenoid sinuses, and left frontal sinus. Mild mucosal thickening of the remaining sinuses. Other: No sizable mastoid effusions. IMPRESSION: 1. Status post resection of a right frontal operculum lesion with small volume extra-axial fluid/hemorrhage and pneumocephalus detailed above. Mild (3  mm) leftward midline shift. Small acute infarct along the deep/inferior aspect of the resection cavity. 2. This study will serve as a baseline for future studies. Ill-defined enhancement along the inferior aspect of the resection cavity may represent postoperative change, but warrants attention on the follow up. 3. Chronic paranasal sinus opacification. Electronically Signed   By: FMargaretha SheffieldMD   On: 11/17/2020 07:47   MR BRAIN W WO CONTRAST  Result Date: 11/14/2020 CLINICAL DATA:  86year old male with seizure like activity. History of hypertension and vascular disease. EXAM: MRI HEAD WITHOUT AND WITH CONTRAST TECHNIQUE: Multiplanar, multiecho pulse sequences of the brain and surrounding structures were obtained without and with intravenous contrast. CONTRAST:  7.574mGADAVIST GADOBUTROL 1 MMOL/ML IV SOLN COMPARISON:  Truncated brain MRI without contrast 11/13/2020. CT head and neck that day. FINDINGS: Brain: Solitary round rim enhancing mass measuring up to 17 mm long axis is centered at the right frontal operculum (series 20, image 31) with T2 and FLAIR hyperintensity within the mass and regional white matter T2 and FLAIR hyperintensity, mild gyral edema. Some of the regional cortex is also abnormally T2 and FLAIR hyperintense. But the pattern most resembles vasogenic edema. No associated diffusion changes. No other abnormal intracranial enhancement or similar mass lesion. No restricted diffusion to suggest acute infarction. No midline shift, ventriculomegaly, extra-axial collection or acute intracranial hemorrhage. Cervicomedullary junction and pituitary are within normal limits. Outside of the right frontal operculum there is patchy and widely scattered bilateral nonenhancing cerebral white matter T2 and FLAIR hyperintensity, most resembling chronic small vessel disease. Mild associated T2 heterogeneity in the right caudate nucleus. No cortical encephalomalacia or chronic cerebral blood products are  identified. Vascular: Major intracranial vascular flow voids are preserved. The major dural venous sinuses are enhancing and appear to be patent. Skull and upper cervical spine: Negative visible cervical spine and spinal cord. Visualized bone marrow signal is within normal limits. Sinuses/Orbits: Postoperative changes to both globes. Chronic paranasal sinusitis with extensive bilateral ethmoid and sphenoid sinus involvement. Associated mucosal hyperenhancement. No other complicating features. Other: Mastoids and visible internal auditory structures appear negative. Negative visible scalp and face soft tissues. IMPRESSION: 1. Solitary 17 mm rim enhancing  mass in the right frontal operculum with regional edema. Favor a solitary Brain Metastasis over other differential considerations such as High-grade Glioma. Recommend follow-up CT Chest, Abdomen, and Pelvis to evaluate for a primary malignancy. 2. No other acute intracranial abnormality or abnormal enhancement. Additional signal changes in the bilateral white matter and right caudate nucleus most commonly due to chronic small vessel disease. 3. Chronic paranasal sinusitis. No other complicating features. Electronically Signed   By: Genevie Ann M.D.   On: 11/14/2020 11:34   CT CHEST ABDOMEN PELVIS W CONTRAST  Result Date: 11/14/2020 CLINICAL DATA:  Recent MRI showing brain mass, initial encounter EXAM: CT CHEST, ABDOMEN, AND PELVIS WITH CONTRAST TECHNIQUE: Multidetector CT imaging of the chest, abdomen and pelvis was performed following the standard protocol during bolus administration of intravenous contrast. CONTRAST:  12m OMNIPAQUE IOHEXOL 300 MG/ML  SOLN COMPARISON:  Chest x-ray from the previous day, 12/05/2016. FINDINGS: CT CHEST FINDINGS Cardiovascular: Atherosclerotic calcifications of the thoracic aorta are noted. Coronary calcifications are seen. No cardiac enlargement is noted. Pulmonary artery as visualized is within normal limits. Mediastinum/Nodes:  Thoracic inlet is unremarkable. No sizable hilar or mediastinal adenopathy is noted. The esophagus as visualized is within normal limits. Lungs/Pleura: Lungs are well aerated bilaterally. Mild scarring is noted in the right middle lobe. This is stable in appearance from the prior exam. Scattered calcified granulomas are seen. No focal infiltrate, effusion or parenchymal nodule is noted. Mild scarring is noted in the left lower lobe as well. Musculoskeletal: Degenerative changes of the thoracic spine are seen. No rib abnormality is noted. CT ABDOMEN PELVIS FINDINGS Hepatobiliary: Fatty infiltration of the liver is noted. The gallbladder is within normal limits. Pancreas: Unremarkable. No pancreatic ductal dilatation or surrounding inflammatory changes. Spleen: Normal in size without focal abnormality. Adrenals/Urinary Tract: Adrenal glands are within normal limits bilaterally. Kidneys demonstrate a normal enhancement pattern. Multiple cysts are noted bilaterally. 1 of the left renal cysts demonstrates some calcification within although the overall appearance is stable measuring 2.4 cm similar to that noted on prior exam. No renal calculi or obstructive changes are seen. Bladder is well distended. Stomach/Bowel: Scattered diverticular change of the colon is noted without evidence of diverticulitis. The appendix is well visualized and within normal limits. Small bowel and stomach are unremarkable. Vascular/Lymphatic: Aortic atherosclerosis. No enlarged abdominal or pelvic lymph nodes. Reproductive: Prostate is enlarged without discrete mass. Other: No abdominal wall hernia or abnormality. No abdominopelvic ascites. Musculoskeletal: Degenerative changes of lumbar spine are noted. No acute abnormality is seen. IMPRESSION: Scarring is noted in the lungs bilaterally stable from the prior study. No focal parenchymal lesion is seen to correspond with the given clinical history of metastatic disease. Fatty liver. Chronic  appearing cysts within the kidneys bilaterally. One of the cysts demonstrates some internal calcification but stable from 2018 consistent with a benign etiology. Diverticulosis without diverticulitis. Electronically Signed   By: MInez CatalinaM.D.   On: 11/14/2020 19:07   DG Chest Portable 1 View  Result Date: 11/13/2020 CLINICAL DATA:  86year old male with altered mental status. Code stroke. EXAM: PORTABLE CHEST 1 VIEW COMPARISON:  Chest radiographs 01/26/2017 and earlier. FINDINGS: Portable AP semi upright view at 0717 hours. Chronically low lung volumes with stable elevation or eventration of the left hemidiaphragm. Normal cardiac size and mediastinal contours. Visualized tracheal air column is within normal limits. Allowing for portable technique the lungs are clear. No pneumothorax. No acute osseous abnormality identified. IMPRESSION: Stable chronically low lung volumes with elevation/eventration of the  diaphragm. No acute cardiopulmonary abnormality. Electronically Signed   By: Genevie Ann M.D.   On: 11/13/2020 07:44   EEG adult  Result Date: 11/13/2020 Lora Havens, MD     11/13/2020 11:42 AM Patient Name: DEUNDRA BARD MRN: [id number] Epilepsy Attending: Lora Havens Referring Physician/Provider: Dr. Kerney Elbe Date: 11/13/2020 Duration: 24.43 mins Patient history: 86 year old male with rhythmic left facial twitching and dysarthria.  EEG Yadav seizures. Level of alertness: Awake AEDs during EEG study: [place]ra Technical aspects: This EEG study was done with scalp electrodes positioned according to the 10-20 International system of electrode placement. Electrical activity was acquired at a sampling rate of '500Hz'  and reviewed with a high frequency filter of '70Hz'  and a low frequency filter of '1Hz' . EEG data were recorded continuously and digitally stored. Description: The posterior dominant rhythm consists of 8 Hz activity of moderate voltage (25-35 uV) seen predominantly in posterior head regions,  symmetric and reactive to eye opening and eye closing. Hyperventilation and photic stimulation were not performed.   IMPRESSION: This study is within normal limits. No seizures or epileptiform discharges were seen throughout the recording. Lora Havens   ECHOCARDIOGRAM COMPLETE  Result Date: 11/13/2020    ECHOCARDIOGRAM REPORT   Patient Name:   HAMDAN TOSCANO Date of Exam: 11/13/2020 Medical Rec #:  [id number]      Height:       66.0 in Accession #:    [id number]     Weight:       171.5 lb Date of Birth:  [birth year]     BSA:          1.873 m Patient Age:    89 years       BP:           145/70 mmHg Patient Gender: M              HR:           60 bpm. Exam Location:  Inpatient Procedure: 2D Echo, 3D Echo, Color Doppler, Cardiac Doppler and Strain Analysis Indications:    Stroke i63.9  History:        Patient has prior history of Echocardiogram examinations, most                 recent 12/23/2019. CHF; Risk Factors:Hypertension and                 Dyslipidemia.  Sonographer:    Raquel Sarna Senior RDCS Referring Phys: [place]  1. Left ventricular ejection fraction, by estimation, is 35 to 40%. The left ventricle has moderately decreased function. The left ventricle demonstrates regional wall motion abnormalities (see scoring diagram/findings for description). The left ventricular internal cavity size was mildly dilated. Left ventricular diastolic parameters are consistent with Grade I diastolic dysfunction (impaired relaxation). There is moderate dyskinesis of the left ventricular, basal-mid inferior wall.  2. Right ventricular systolic function is normal. The right ventricular size is normal. There is normal pulmonary artery systolic pressure.  3. The mitral valve is grossly normal. Mild mitral valve regurgitation.  4. The aortic valve is tricuspid. Aortic valve regurgitation is moderate. FINDINGS  Left Ventricle: Left ventricular ejection fraction, by estimation, is 35 to 40%. The left  ventricle has moderately decreased function. The left ventricle demonstrates regional wall motion abnormalities. Moderate dyskinesis of the left ventricular, basal-mid inferior wall. The left ventricular internal cavity size was mildly dilated. There is borderline left ventricular hypertrophy. Left ventricular diastolic parameters are consistent  with Grade I diastolic dysfunction (impaired relaxation). Right Ventricle: The right ventricular size is normal. Right vetricular wall thickness was not well visualized. Right ventricular systolic function is normal. There is normal pulmonary artery systolic pressure. The tricuspid regurgitant velocity is 2.07 m/s, and with an assumed right atrial pressure of 3 mmHg, the estimated right ventricular systolic pressure is 93.2 mmHg. Left Atrium: Left atrial size was normal in size. Right Atrium: Right atrial size was normal in size. Pericardium: There is no evidence of pericardial effusion. Mitral Valve: The mitral valve is grossly normal. Mild mitral valve regurgitation. Tricuspid Valve: The tricuspid valve is grossly normal. Tricuspid valve regurgitation is trivial. Aortic Valve: The aortic valve is tricuspid. Aortic valve regurgitation is moderate. Aortic regurgitation PHT measures 481 msec. Pulmonic Valve: The pulmonic valve was grossly normal. Pulmonic valve regurgitation is mild to moderate. Aorta: The aortic root was not well visualized. IAS/Shunts: The atrial septum is grossly normal.  LEFT VENTRICLE PLAX 2D LVIDd:         4.20 cm  Diastology LVIDs:         2.90 cm  LV e' medial:    3.92 cm/s LV PW:         1.10 cm  LV E/e' medial:  14.2 LV IVS:        1.10 cm  LV e' lateral:   4.24 cm/s LVOT diam:     2.40 cm  LV E/e' lateral: 13.1 LV SV:         83 LV SV Index:   44 LVOT Area:     4.52 cm                          3D Volume EF:                         3D EF:        45 %                         LV EDV:       152 ml                         LV ESV:       84 ml                          LV SV:        68 ml RIGHT VENTRICLE RV S prime:     126.30 cm/s TAPSE (M-mode): 2.0 cm LEFT ATRIUM             Index       RIGHT ATRIUM           Index LA diam:        3.80 cm 2.03 cm/m  RA Area:     13.20 cm LA Vol (A2C):   54.5 ml 29.09 ml/m RA Volume:   27.90 ml  14.89 ml/m LA Vol (A4C):   42.8 ml 22.85 ml/m LA Biplane Vol: 52.2 ml 27.86 ml/m  AORTIC VALVE LVOT Vmax:   75.00 cm/s LVOT Vmean:  53.300 cm/s LVOT VTI:    0.183 m AI PHT:      481 msec MITRAL VALVE               TRICUSPID VALVE MV Area (PHT): 2.71 cm  TR Peak grad:   17.1 mmHg MV Decel Time: 280 msec    TR Vmax:        207.00 cm/s MV E velocity: 55.50 cm/s MV A velocity: 95.90 cm/s  SHUNTS MV E/A ratio:  0.58        Systemic VTI:  0.18 m                            Systemic Diam: 2.40 cm Mertie Moores MD Electronically signed by Mertie Moores MD Signature Date/Time: 11/13/2020/3:20:00 PM    Final    CT HEAD CODE STROKE WO CONTRAST  Result Date: 11/13/2020 CLINICAL DATA:  Code stroke. 86 year old male with neurologic deficit. Dysarthria. EXAM: CT HEAD WITHOUT CONTRAST TECHNIQUE: Contiguous axial images were obtained from the base of the skull through the vertex without intravenous contrast. COMPARISON:  10/13/2016 head CT report (no images available). FINDINGS: Brain: No midline shift, mass effect, or evidence of intracranial mass lesion. No acute intracranial hemorrhage identified. No ventriculomegaly. Confluent right greater than left white matter hypodensity, involving the right frontal operculum. Mild asymmetric heterogeneity also in the right basal ganglia. Furthermore, there is possible mild cortical cytotoxic edema at the frontal operculum on coronal image 50, series 3, image 19. But no other changes of acute cortically based infarct are identified. No chronic cortical encephalomalacia identified. Vascular: Mild Calcified atherosclerosis at the skull base. No suspicious intracranial vascular hyperdensity. Skull: No acute  osseous abnormality identified. Sinuses/Orbits: Maxillary mucoperiosteal thickening and subtotal opacification of the other paranasal sinuses. Periosteal thickening also in the sphenoid and frontal sinuses. Tympanic cavities and mastoids are clear. Other: Postoperative changes to both globes. No acute orbit or scalp soft tissue finding. ASPECTS [facility] Stroke Program Early CT Score) Total score (0-10 with 10 being normal): 9 (abnormal right M 5 segment). IMPRESSION: 1. Advanced bilateral white matter disease with questionable acute cytotoxic edema in the right frontal operculum (righ M5 segment). ASPECTS 9. No acute hemorrhage, mass effect, or hyperdense vessel. 2. These results were communicated to Dr. Theda Sers at 7:00 am on 11/13/2020 by text page via the [facility] messaging system. Electronically Signed   By: Genevie Ann M.D.   On: 11/13/2020 07:01   CT ANGIO HEAD NECK W WO CM (CODE STROKE)  Result Date: 11/13/2020 CLINICAL DATA:  Neuro deficit, acute stroke suspected. EXAM: CT ANGIOGRAPHY HEAD AND NECK TECHNIQUE: Multidetector CT imaging of the head and neck was performed using the standard protocol during bolus administration of intravenous contrast. Multiplanar CT image reconstructions and MIPs were obtained to evaluate the vascular anatomy. Carotid stenosis measurements (when applicable) are obtained utilizing NASCET criteria, using the distal internal carotid diameter as the denominator. CONTRAST:  57m OMNIPAQUE IOHEXOL 350 MG/ML SOLN COMPARISON:  Same day CT head. FINDINGS: CTA NECK FINDINGS Aortic arch: Great vessel origins are patent. Right carotid system: No evidence of dissection, stenosis (50% or greater) or occlusion. Mild atherosclerosis. Left carotid system: No evidence of dissection, stenosis (50% or greater) or occlusion. Vertebral arteries: Left dominant. No evidence of dissection, stenosis (50% or greater) or occlusion. Skeleton: With moderate multilevel degenerative change in the cervical spine.  Other neck: No acute abnormality. Upper chest: Visualized lung apices are clear. Review of the MIP images confirms the above findings CTA HEAD FINDINGS Anterior circulation: Bilateral ICAs, MCAs and ACAs are patent without proximal hemodynamically significant stenosis. Calcific atherosclerosis of bilateral intracranial ICAs without greater than 50% narrowing. No aneurysm. Posterior circulation: Bilateral intradural vertebral arteries,  basilar artery, and posterior cerebral arteries are patent without proximal hemodynamically significant stenosis. No aneurysm. Venous sinuses: As permitted by contrast timing, patent. Review of the MIP images confirms the above findings IMPRESSION: No large vessel occlusion or proximal hemodynamically significant stenosis in the head or neck. Electronically Signed   By: Margaretha Sheffield MD   On: 11/13/2020 07:48    Pathology: SURGICAL PATHOLOGY  CASE: [id number]  PATIENT: Orlene Erm  Surgical Pathology Report   Clinical History: brain mass (cm)   FINAL MICROSCOPIC DIAGNOSIS:   A. BRAIN TUMOR, RIGHT SIDE, EXCISION:  - Glioblastoma multiforme.  - See comment.   COMMENT:  The tumor shows marked atypia and vascular proliferation.  Immunohistochemistry shows diffuse strong positivity with GFAP and  negative staining with cytokeratin AE1/AE3, Melan-8, HMB45, S100, Sox-10  and PRAME.  The morphology and immunophenotype are consistent with  glioblastoma.  Testing for IDH1/IDH2 and MGMT will be ordered and  reported separately.   Dr. Tresa Moore reviewed this case and agrees.   Called to Dr. Colleen Can office on 11/20/2020.    Assessment/Plan Glioblastoma ([place]) [C71.9]  We appreciate the opportunity to participate in the care of Nathan Hall.   We had an extensive goals of care conversation with him and his family regarding pathology, prognosis, and available treatment pathways.  Though his tumor location and resection quality are favorable, age and  comorbidity (cardiac) are unfavorable.    We provided the following treatment options: -Course of intensity modulated radiation therapy and concurrent daily Temozolomide.  Radiation will be administered Mon-Fri over 6 weeks, Temodar will be dosed at 44m/m2 to be given daily over 42 days.  We reviewed side effects of temodar, including fatigue, nausea/vomiting, constipation, and cytopenias. -Same regimen, but three weeks instead of six weeks, +/- chemo -Palliative care only  Should continue Keppra [id number], remain off dexamethasone  Screening for potential clinical trials was performed and discussed using eligibility criteria for active protocols [facility], as well as national database available on [email or web address]   The patient is not a candidate for a research protocol at this time due to patient preference.   We spent twenty additional minutes teaching regarding the natural history, biology, and historical experience in the treatment of brain tumors. We then discussed in detail the current recommendations for therapy focusing on the mode of administration, mechanism of action, anticipated toxicities, and quality of life issues associated with this plan. We also provided teaching sheets for the patient to take home as an additional resource.  WiDerrek Monacoill take some time to discuss options with his family.  He will reach out to [facility] with a preferred treatment plan from what was presented today.   All questions were answered. The patient knows to call the clinic with any problems, questions or concerns. No barriers to learning were detected.  The total time spent in the encounter was 60 minutes and more than 50% was on counseling and review of test results   ZaVentura SellersMD Medical Director of Neuro-Oncology [facility] We[place]7/05/22 9:04 AM

## 2020-12-02 ENCOUNTER — Encounter (HOSPITAL_COMMUNITY): Payer: Self-pay | Admitting: Neurological Surgery

## 2020-12-06 NOTE — Progress Notes (Signed)
Radiation Oncology         [phone] ________________________________  Name: Nathan Hall MRN: [id number]  Date: 12/01/2020  DOB: 1935/05/28  QQ:[id number], Sharon Mt, MD  Judith Part, MD     REFERRING PHYSICIAN: Judith Part, MD   DIAGNOSIS: The encounter diagnosis was Glioblastoma multiforme of frontal lobe ([place]).   HISTORY OF PRESENT ILLNESS::Nathan Hall is a 86 y.o. male who is seen for an initial consultation visit regarding the patient's diagnosis of glioblastoma.  The was initially found to have some seizure activity and cranial imaging was conducted.  MRI scan of the brain revealed a 1.7 cm right frontal tumor which was suspicious for a primary neoplasm.  The patient underwent surgery on 11/16/2020.  Pathology did confirm a glioblastoma.  Postoperative MRI scan looked good with likely postoperative changes seen.  The patient has seen neuro oncology earlier today and is considering his options.  I have been asked to see the patient for discussion of possible radiation treatment.    PREVIOUS RADIATION THERAPY: No   PAST MEDICAL HISTORY:  has a past medical history of Abnormal nuclear stress test (10/07/2016), Aftercare following surgery (02/22/2018), Cancer ([place]), Cardiomyopathy ([place]) (12/09/2014), Cataract, Cataract, CHF (congestive heart failure) ([place]), CLL (chronic lymphocytic leukemia) ([place]), Coronary artery disease, Coronary artery disease involving native coronary artery of native heart without angina pectoris (12/09/2014), Dyslipidemia (12/09/2014), Essential hypertension (12/09/2014), GERD (gastroesophageal reflux disease), Hyperlipidemia, Hypertension, Ingrown nail (02/15/2018), Intermittent lightheadedness (10/13/2016), LV dysfunction (04/11/2017), Pulmonary infiltrates on CXR (01/26/2017), PVC's (premature ventricular contractions) (10/07/2016), and Rhinitis, chronic (01/27/2017).     PAST SURGICAL HISTORY: Past Surgical History:  Procedure Laterality Date    APPENDECTOMY     APPLICATION OF CRANIAL NAVIGATION Right 11/16/2020   Procedure: APPLICATION OF CRANIAL NAVIGATION;  Surgeon: Judith Part, MD;  Location: [facility];  Service: Neurosurgery;  Laterality: Right;   BASAL CELL CARCINOMA EXCISION  10/31/2019   cardiac stents     CRANIOTOMY Right 11/16/2020   Procedure: CRANIOTOMY FOR TUMOR EXCISION;  Surgeon: Judith Part, MD;  Location: [facility];  Service: Neurosurgery;  Laterality: Right;   EYE SURGERY     HERNIA REPAIR       FAMILY HISTORY: family history includes Alzheimer's disease in his maternal grandfather; Cancer in his paternal grandmother; Congestive Heart Failure in his father; Heart Problems in his sister; Heart attack in his maternal grandmother; Pneumonia in his mother; Stomach cancer in his father.   SOCIAL HISTORY:  reports that he has never smoked. He has never used smokeless tobacco. He reports that he does not drink alcohol and does not use drugs.   ALLERGIES: Lisinopril, Niaspan [niacin er], and Simvastatin   MEDICATIONS:  Current Outpatient Medications  Medication Sig Dispense Refill   aspirin EC 81 MG tablet Take 1 tablet (81 mg total) by mouth daily. Restart on 11/23/20 30 tablet 11   atorvastatin (LIPITOR) 20 MG tablet TAKE 1 TABLET BY MOUTH EVERY DAY 90 tablet 1   busPIRone (BUSPAR) 10 MG tablet Take 10 mg by mouth 2 (two) times daily as needed (anxiety attacks).     carvedilol (COREG) 25 MG tablet Take 0.5 tablets (12.5 mg total) by mouth 2 (two) times daily with a meal. 180 tablet 1   Cholecalciferol (VITAMIN D3) 2000 units capsule Take 2,000 Units by mouth daily.     CO ENZYME Q-10 PO Take 200 mg by mouth daily.     fluticasone (FLONASE) 50 MCG/ACT nasal spray Place 2 sprays into  both nostrils daily.     furosemide (LASIX) 40 MG tablet Take 40 mg by mouth daily as needed for edema.     levETIRAcetam (KEPPRA) 500 MG tablet Take 1 tablet (500 mg total) by mouth 2 (two) times daily. 60 tablet 5   mexiletine  (MEXITIL) 150 MG capsule TAKE 1 CAPSULE BY MOUTH TWICE A DAY 180 capsule 2   multivitamin-lutein (OCUVITE-LUTEIN) CAPS capsule Take 1 capsule by mouth daily.     nitroGLYCERIN (NITROSTAT) 0.4 MG SL tablet Place 1 tablet under the tongue as needed for chest pain.     Omega-3 Fatty Acids (FISH OIL) 1000 MG CAPS Take 1,000 mg by mouth daily.     pantoprazole (PROTONIX) 40 MG tablet Take 40 mg by mouth daily.     potassium chloride SA (K-DUR,KLOR-CON) 20 MEQ tablet Take 20 mEq by mouth daily.     spironolactone (ALDACTONE) 25 MG tablet Take 12.5 mg by mouth daily.     No current facility-administered medications for this encounter.     REVIEW OF SYSTEMS:  A 15 point review of systems is documented in the electronic medical record. This was obtained by the nursing staff. However, I reviewed this with the patient to discuss relevant findings and make appropriate changes.  Pertinent items are noted in HPI.    PHYSICAL EXAM:  height is 5\' 6"  (1.676 m) and weight is 165 lb 8 oz (75.1 kg). His temporal temperature is 97.1 F (36.2 C) (abnormal). His blood pressure is 142/79 (abnormal) and his pulse is 82. His respiration is 18 and oxygen saturation is 97%.   ECOG = 1  0 - Asymptomatic (Fully active, able to carry on all predisease activities without restriction)  1 - Symptomatic but completely ambulatory (Restricted in physically strenuous activity but ambulatory and able to carry out work of a light or sedentary nature. For example, light housework, office work)  2 - Symptomatic, <50% in bed during the day (Ambulatory and capable of all self care but unable to carry out any work activities. Up and about more than 50% of waking hours)  3 - Symptomatic, >50% in bed, but not bedbound (Capable of only limited self-care, confined to bed or chair 50% or more of waking hours)  4 - Bedbound (Completely disabled. Cannot carry on any self-care. Totally confined to bed or chair)  5 - Death   Eustace Pen MM,  Creech RH, Tormey DC, et al. [phone]). "Toxicity and response criteria of the [facility]". [place] Oncol. 5 (6): 649-55  Alert, no distress Incision site looks good which is healing well, no sign of erythema or infection   LABORATORY DATA:  Lab Results  Component Value Date   WBC 20.7 (H) 11/17/2020   HGB 15.0 11/17/2020   HCT 43.7 11/17/2020   MCV 95.0 11/17/2020   PLT 200 11/17/2020   Lab Results  Component Value Date   NA 137 11/19/2020   K 3.7 11/19/2020   CL 100 11/19/2020   CO2 29 11/19/2020   Lab Results  Component Value Date   ALT 19 11/13/2020   AST 27 11/13/2020   ALKPHOS 68 11/13/2020   BILITOT 1.2 11/13/2020      RADIOGRAPHY: MR BRAIN WO CONTRAST  Result Date: 11/13/2020 CLINICAL DATA:  Seizure EXAM: MRI HEAD WITHOUT CONTRAST TECHNIQUE: Multiplanar, multiecho pulse sequences of the brain and surrounding structures were obtained without intravenous contrast. COMPARISON:  None. FINDINGS: An abbreviated protocol was used due to patient altered mental  status and inability to tolerate the full length of the examination. Diffusion-weighted imaging, axial T2-weighted imaging and sagittal T1-weighted imaging was performed. There is no acute infarct. No midline shift or other mass effect. White matter changes of chronic microvascular ischemia. Generalized volume loss. There is a rounded area of abnormal signal within the right frontal lobe that is slightly volume positive. IMPRESSION: 1. Truncated examination due to patient altered mental status and inability to tolerate the full length of the examination. 2. Within that limitation, there is a rounded area of gyral expansion in the right frontal lobe. Repeat MRI with contrast is recommended when the patient can tolerate it to exclude a neoplastic process. Electronically Signed   By: Ulyses Jarred M.D.   On: 11/13/2020 20:34   MR BRAIN W WO CONTRAST  Result Date: 11/17/2020 CLINICAL DATA:  Brain mass  or lesion status post craniotomy for tumor resection. EXAM: MRI HEAD WITHOUT AND WITH CONTRAST TECHNIQUE: Multiplanar, multiecho pulse sequences of the brain and surrounding structures were obtained without and with intravenous contrast. CONTRAST:  7.34mL GADAVIST GADOBUTROL 1 MMOL/ML IV SOLN COMPARISON:  November 14, 2020 MRI. FINDINGS: Brain: Status post resection of a right frontal operculum lesion. Expected postoperative changes including hemorrhage at the resection site and small overlying right convexity extra-axial fluid/hemorrhage, measuring up to 5 mm in thickness posteriorly. There is anti-dependent pneumocephalus overlying the right frontal convexity (approximately 1.6 cm in thickness) with mild mass effect on the right frontal lobe anteriorly. Edema surrounding the resection cavity is not substantially changed compared to recent prior. There is mild (3 mm) leftward midline shift at the foramen of [place]. Small acute infarct along the deep/inferior aspect of the resection cavity (series 5, images 74/75). No hydrocephalus. Similar additional patchy white matter T2/FLAIR hyperintensities, which are nonspecific but most likely related to chronic microvascular ischemic disease. Vascular: Major arterial flow voids are maintained at the skull base. Skull and upper cervical spine: Right frontal craniotomy. Otherwise, normal marrow signal. Sinuses/Orbits: Opacified ethmoid air cells, sphenoid sinuses, and left frontal sinus. Mild mucosal thickening of the remaining sinuses. Other: No sizable mastoid effusions. IMPRESSION: 1. Status post resection of a right frontal operculum lesion with small volume extra-axial fluid/hemorrhage and pneumocephalus detailed above. Mild (3 mm) leftward midline shift. Small acute infarct along the deep/inferior aspect of the resection cavity. 2. This study will serve as a baseline for future studies. Ill-defined enhancement along the inferior aspect of the resection cavity may represent  postoperative change, but warrants attention on the follow up. 3. Chronic paranasal sinus opacification. Electronically Signed   By: Margaretha Sheffield MD   On: 11/17/2020 07:47   MR BRAIN W WO CONTRAST  Result Date: 11/14/2020 CLINICAL DATA:  86 year old male with seizure like activity. History of hypertension and vascular disease. EXAM: MRI HEAD WITHOUT AND WITH CONTRAST TECHNIQUE: Multiplanar, multiecho pulse sequences of the brain and surrounding structures were obtained without and with intravenous contrast. CONTRAST:  7.61mL GADAVIST GADOBUTROL 1 MMOL/ML IV SOLN COMPARISON:  Truncated brain MRI without contrast 11/13/2020. CT head and neck that day. FINDINGS: Brain: Solitary round rim enhancing mass measuring up to 17 mm long axis is centered at the right frontal operculum (series 20, image 31) with T2 and FLAIR hyperintensity within the mass and regional white matter T2 and FLAIR hyperintensity, mild gyral edema. Some of the regional cortex is also abnormally T2 and FLAIR hyperintense. But the pattern most resembles vasogenic edema. No associated diffusion changes. No other abnormal intracranial enhancement or similar mass  lesion. No restricted diffusion to suggest acute infarction. No midline shift, ventriculomegaly, extra-axial collection or acute intracranial hemorrhage. Cervicomedullary junction and pituitary are within normal limits. Outside of the right frontal operculum there is patchy and widely scattered bilateral nonenhancing cerebral white matter T2 and FLAIR hyperintensity, most resembling chronic small vessel disease. Mild associated T2 heterogeneity in the right caudate nucleus. No cortical encephalomalacia or chronic cerebral blood products are identified. Vascular: Major intracranial vascular flow voids are preserved. The major dural venous sinuses are enhancing and appear to be patent. Skull and upper cervical spine: Negative visible cervical spine and spinal cord. Visualized bone marrow  signal is within normal limits. Sinuses/Orbits: Postoperative changes to both globes. Chronic paranasal sinusitis with extensive bilateral ethmoid and sphenoid sinus involvement. Associated mucosal hyperenhancement. No other complicating features. Other: Mastoids and visible internal auditory structures appear negative. Negative visible scalp and face soft tissues. IMPRESSION: 1. Solitary 17 mm rim enhancing mass in the right frontal operculum with regional edema. Favor a solitary Brain Metastasis over other differential considerations such as High-grade Glioma. Recommend follow-up CT Chest, Abdomen, and Pelvis to evaluate for a primary malignancy. 2. No other acute intracranial abnormality or abnormal enhancement. Additional signal changes in the bilateral white matter and right caudate nucleus most commonly due to chronic small vessel disease. 3. Chronic paranasal sinusitis. No other complicating features. Electronically Signed   By: Genevie Ann M.D.   On: 11/14/2020 11:34   CT CHEST ABDOMEN PELVIS W CONTRAST  Result Date: 11/14/2020 CLINICAL DATA:  Recent MRI showing brain mass, initial encounter EXAM: CT CHEST, ABDOMEN, AND PELVIS WITH CONTRAST TECHNIQUE: Multidetector CT imaging of the chest, abdomen and pelvis was performed following the standard protocol during bolus administration of intravenous contrast. CONTRAST:  136mL OMNIPAQUE IOHEXOL 300 MG/ML  SOLN COMPARISON:  Chest x-ray from the previous day, 12/05/2016. FINDINGS: CT CHEST FINDINGS Cardiovascular: Atherosclerotic calcifications of the thoracic aorta are noted. Coronary calcifications are seen. No cardiac enlargement is noted. Pulmonary artery as visualized is within normal limits. Mediastinum/Nodes: Thoracic inlet is unremarkable. No sizable hilar or mediastinal adenopathy is noted. The esophagus as visualized is within normal limits. Lungs/Pleura: Lungs are well aerated bilaterally. Mild scarring is noted in the right middle lobe. This is stable  in appearance from the prior exam. Scattered calcified granulomas are seen. No focal infiltrate, effusion or parenchymal nodule is noted. Mild scarring is noted in the left lower lobe as well. Musculoskeletal: Degenerative changes of the thoracic spine are seen. No rib abnormality is noted. CT ABDOMEN PELVIS FINDINGS Hepatobiliary: Fatty infiltration of the liver is noted. The gallbladder is within normal limits. Pancreas: Unremarkable. No pancreatic ductal dilatation or surrounding inflammatory changes. Spleen: Normal in size without focal abnormality. Adrenals/Urinary Tract: Adrenal glands are within normal limits bilaterally. Kidneys demonstrate a normal enhancement pattern. Multiple cysts are noted bilaterally. 1 of the left renal cysts demonstrates some calcification within although the overall appearance is stable measuring 2.4 cm similar to that noted on prior exam. No renal calculi or obstructive changes are seen. Bladder is well distended. Stomach/Bowel: Scattered diverticular change of the colon is noted without evidence of diverticulitis. The appendix is well visualized and within normal limits. Small bowel and stomach are unremarkable. Vascular/Lymphatic: Aortic atherosclerosis. No enlarged abdominal or pelvic lymph nodes. Reproductive: Prostate is enlarged without discrete mass. Other: No abdominal wall hernia or abnormality. No abdominopelvic ascites. Musculoskeletal: Degenerative changes of lumbar spine are noted. No acute abnormality is seen. IMPRESSION: Scarring is noted in the lungs bilaterally  stable from the prior study. No focal parenchymal lesion is seen to correspond with the given clinical history of metastatic disease. Fatty liver. Chronic appearing cysts within the kidneys bilaterally. One of the cysts demonstrates some internal calcification but stable from 2018 consistent with a benign etiology. Diverticulosis without diverticulitis. Electronically Signed   By: Inez Catalina M.D.   On:  11/14/2020 19:07   DG Chest Portable 1 View  Result Date: 11/13/2020 CLINICAL DATA:  86 year old male with altered mental status. Code stroke. EXAM: PORTABLE CHEST 1 VIEW COMPARISON:  Chest radiographs 01/26/2017 and earlier. FINDINGS: Portable AP semi upright view at 0717 hours. Chronically low lung volumes with stable elevation or eventration of the left hemidiaphragm. Normal cardiac size and mediastinal contours. Visualized tracheal air column is within normal limits. Allowing for portable technique the lungs are clear. No pneumothorax. No acute osseous abnormality identified. IMPRESSION: Stable chronically low lung volumes with elevation/eventration of the diaphragm. No acute cardiopulmonary abnormality. Electronically Signed   By: Genevie Ann M.D.   On: 11/13/2020 07:44   EEG adult  Result Date: 11/13/2020 Lora Havens, MD     11/13/2020 11:42 AM Patient Name: KAZIMIR HARTNETT MRN: [id number] Epilepsy Attending: Lora Havens Referring Physician/Provider: Dr. Kerney Elbe Date: 11/13/2020 Duration: 24.43 mins Patient history: 86 year old male with rhythmic left facial twitching and dysarthria.  EEG Yadav seizures. Level of alertness: Awake AEDs during EEG study: [place] Technical aspects: This EEG study was done with scalp electrodes positioned according to the 10-20 International system of electrode placement. Electrical activity was acquired at a sampling rate of 500Hz  and reviewed with a high frequency filter of 70Hz  and a low frequency filter of 1Hz . EEG data were recorded continuously and digitally stored. Description: The posterior dominant rhythm consists of 8 Hz activity of moderate voltage (25-35 uV) seen predominantly in posterior head regions, symmetric and reactive to eye opening and eye closing. Hyperventilation and photic stimulation were not performed.   IMPRESSION: This study is within normal limits. No seizures or epileptiform discharges were seen throughout the recording. Lora Havens   ECHOCARDIOGRAM COMPLETE  Result Date: 11/13/2020    ECHOCARDIOGRAM REPORT   Patient Name:   Nathan Hall Date of Exam: 11/13/2020 Medical Rec #:  [id number]      Height:       66.0 in Accession #:    [id number]     Weight:       171.5 lb Date of Birth:  1934/06/24     BSA:          1.873 m Patient Age:    69 years       BP:           145/70 mmHg Patient Gender: M              HR:           60 bpm. Exam Location:  Inpatient Procedure: 2D Echo, 3D Echo, Color Doppler, Cardiac Doppler and Strain Analysis Indications:    Stroke i63.9  History:        Patient has prior history of Echocardiogram examinations, most                 recent 12/23/2019. CHF; Risk Factors:Hypertension and                 Dyslipidemia.  Sonographer:    Raquel Sarna Senior RDCS Referring Phys: [place]  1. Left ventricular ejection fraction, by estimation, is 35  to 40%. The left ventricle has moderately decreased function. The left ventricle demonstrates regional wall motion abnormalities (see scoring diagram/findings for description). The left ventricular internal cavity size was mildly dilated. Left ventricular diastolic parameters are consistent with Grade I diastolic dysfunction (impaired relaxation). There is moderate dyskinesis of the left ventricular, basal-mid inferior wall.  2. Right ventricular systolic function is normal. The right ventricular size is normal. There is normal pulmonary artery systolic pressure.  3. The mitral valve is grossly normal. Mild mitral valve regurgitation.  4. The aortic valve is tricuspid. Aortic valve regurgitation is moderate. FINDINGS  Left Ventricle: Left ventricular ejection fraction, by estimation, is 35 to 40%. The left ventricle has moderately decreased function. The left ventricle demonstrates regional wall motion abnormalities. Moderate dyskinesis of the left ventricular, basal-mid inferior wall. The left ventricular internal cavity size was mildly dilated. There is  borderline left ventricular hypertrophy. Left ventricular diastolic parameters are consistent with Grade I diastolic dysfunction (impaired relaxation). Right Ventricle: The right ventricular size is normal. Right vetricular wall thickness was not well visualized. Right ventricular systolic function is normal. There is normal pulmonary artery systolic pressure. The tricuspid regurgitant velocity is 2.07 m/s, and with an assumed right atrial pressure of 3 mmHg, the estimated right ventricular systolic pressure is 16.0 mmHg. Left Atrium: Left atrial size was normal in size. Right Atrium: Right atrial size was normal in size. Pericardium: There is no evidence of pericardial effusion. Mitral Valve: The mitral valve is grossly normal. Mild mitral valve regurgitation. Tricuspid Valve: The tricuspid valve is grossly normal. Tricuspid valve regurgitation is trivial. Aortic Valve: The aortic valve is tricuspid. Aortic valve regurgitation is moderate. Aortic regurgitation PHT measures 481 msec. Pulmonic Valve: The pulmonic valve was grossly normal. Pulmonic valve regurgitation is mild to moderate. Aorta: The aortic root was not well visualized. IAS/Shunts: The atrial septum is grossly normal.  LEFT VENTRICLE PLAX 2D LVIDd:         4.20 cm  Diastology LVIDs:         2.90 cm  LV e' medial:    3.92 cm/s LV PW:         1.10 cm  LV E/e' medial:  14.2 LV IVS:        1.10 cm  LV e' lateral:   4.24 cm/s LVOT diam:     2.40 cm  LV E/e' lateral: 13.1 LV SV:         83 LV SV Index:   44 LVOT Area:     4.52 cm                          3D Volume EF:                         3D EF:        45 %                         LV EDV:       152 ml                         LV ESV:       84 ml                         LV SV:        68 ml  RIGHT VENTRICLE RV S prime:     126.30 cm/s TAPSE (M-mode): 2.0 cm LEFT ATRIUM             Index       RIGHT ATRIUM           Index LA diam:        3.80 cm 2.03 cm/m  RA Area:     13.20 cm LA Vol (A2C):   54.5 ml 29.09  ml/m RA Volume:   27.90 ml  14.89 ml/m LA Vol (A4C):   42.8 ml 22.85 ml/m LA Biplane Vol: 52.2 ml 27.86 ml/m  AORTIC VALVE LVOT Vmax:   75.00 cm/s LVOT Vmean:  53.300 cm/s LVOT VTI:    0.183 m AI PHT:      481 msec MITRAL VALVE               TRICUSPID VALVE MV Area (PHT): 2.71 cm    TR Peak grad:   17.1 mmHg MV Decel Time: 280 msec    TR Vmax:        207.00 cm/s MV E velocity: 55.50 cm/s MV A velocity: 95.90 cm/s  SHUNTS MV E/A ratio:  0.58        Systemic VTI:  0.18 m                            Systemic Diam: 2.40 cm Mertie Moores MD Electronically signed by Mertie Moores MD Signature Date/Time: 11/13/2020/3:20:00 PM    Final    CT HEAD CODE STROKE WO CONTRAST  Result Date: 11/13/2020 CLINICAL DATA:  Code stroke. 86 year old male with neurologic deficit. Dysarthria. EXAM: CT HEAD WITHOUT CONTRAST TECHNIQUE: Contiguous axial images were obtained from the base of the skull through the vertex without intravenous contrast. COMPARISON:  10/13/2016 head CT report (no images available). FINDINGS: Brain: No midline shift, mass effect, or evidence of intracranial mass lesion. No acute intracranial hemorrhage identified. No ventriculomegaly. Confluent right greater than left white matter hypodensity, involving the right frontal operculum. Mild asymmetric heterogeneity also in the right basal ganglia. Furthermore, there is possible mild cortical cytotoxic edema at the frontal operculum on coronal image 50, series 3, image 19. But no other changes of acute cortically based infarct are identified. No chronic cortical encephalomalacia identified. Vascular: Mild Calcified atherosclerosis at the skull base. No suspicious intracranial vascular hyperdensity. Skull: No acute osseous abnormality identified. Sinuses/Orbits: Maxillary mucoperiosteal thickening and subtotal opacification of the other paranasal sinuses. Periosteal thickening also in the sphenoid and frontal sinuses. Tympanic cavities and mastoids are clear. Other:  Postoperative changes to both globes. No acute orbit or scalp soft tissue finding. ASPECTS [facility] Stroke Program Early CT Score) Total score (0-10 with 10 being normal): 9 (abnormal right M 5 segment). IMPRESSION: 1. Advanced bilateral white matter disease with questionable acute cytotoxic edema in the right frontal operculum (righ M5 segment). ASPECTS 9. No acute hemorrhage, mass effect, or hyperdense vessel. 2. These results were communicated to Dr. Theda Sers at 7:00 am on 11/13/2020 by text page via the [facility] messaging system. Electronically Signed   By: Genevie Ann M.D.   On: 11/13/2020 07:01   CT ANGIO HEAD NECK W WO CM (CODE STROKE)  Result Date: 11/13/2020 CLINICAL DATA:  Neuro deficit, acute stroke suspected. EXAM: CT ANGIOGRAPHY HEAD AND NECK TECHNIQUE: Multidetector CT imaging of the head and neck was performed using the standard protocol during bolus administration of intravenous contrast. Multiplanar CT image reconstructions and  MIPs were obtained to evaluate the vascular anatomy. Carotid stenosis measurements (when applicable) are obtained utilizing NASCET criteria, using the distal internal carotid diameter as the denominator. CONTRAST:  21mL OMNIPAQUE IOHEXOL 350 MG/ML SOLN COMPARISON:  Same day CT head. FINDINGS: CTA NECK FINDINGS Aortic arch: Great vessel origins are patent. Right carotid system: No evidence of dissection, stenosis (50% or greater) or occlusion. Mild atherosclerosis. Left carotid system: No evidence of dissection, stenosis (50% or greater) or occlusion. Vertebral arteries: Left dominant. No evidence of dissection, stenosis (50% or greater) or occlusion. Skeleton: With moderate multilevel degenerative change in the cervical spine. Other neck: No acute abnormality. Upper chest: Visualized lung apices are clear. Review of the MIP images confirms the above findings CTA HEAD FINDINGS Anterior circulation: Bilateral ICAs, MCAs and ACAs are patent without proximal hemodynamically  significant stenosis. Calcific atherosclerosis of bilateral intracranial ICAs without greater than 50% narrowing. No aneurysm. Posterior circulation: Bilateral intradural vertebral arteries, basilar artery, and posterior cerebral arteries are patent without proximal hemodynamically significant stenosis. No aneurysm. Venous sinuses: As permitted by contrast timing, patent. Review of the MIP images confirms the above findings IMPRESSION: No large vessel occlusion or proximal hemodynamically significant stenosis in the head or neck. Electronically Signed   By: Margaretha Sheffield MD   On: 11/13/2020 07:48       IMPRESSION/ PLAN:  The patient is status post resection of a right frontal glioblastoma.  He is doing well postoperatively and he appears in reasonable health and is reasonably active for his age it appears.  I discussed with him possible options in terms of treatment including a standard 6-week course of chemoradiation treatment and also a 3-week course of treatment.  We discussed the potential benefit of such treatments and also discussed possible side effects and risks as well.  We also discussed the logistics of treatment in some detail.  The patient is considering all of his options and wishes to think about this for several days before making a decision.  He states that he will let us know how he would like to proceed after further reflection.  He was encouraged to contact us if we can be of any further assistance in helping this decision.   The patient was seen in person today in clinic.  The total time spent on the patient's visit today was 45 minutes, including chart review, direct discussion/evaluation with the patient, and coordination of care.        ________________________________   Jodelle Gross, MD, PhD   **Disclaimer: This note was dictated with voice recognition software. Similar sounding words can inadvertently be transcribed and this note may contain transcription errors  which may not have been corrected upon publication of note.**

## 2020-12-07 ENCOUNTER — Telehealth: Payer: Self-pay | Admitting: Internal Medicine

## 2020-12-07 ENCOUNTER — Telehealth: Payer: Self-pay

## 2020-12-07 NOTE — Telephone Encounter (Signed)
Scheduled per 7/11 sch msg. Called and spoke with pt and his wife, confirmed 7/14 appt

## 2020-12-07 NOTE — Telephone Encounter (Signed)
Pts wife called requesting a follow-up phone appt with Dr. Mickeal Skinner to re-visit the tx options discussed with them 12/01/20.  I have sent a scheduling message to have pt scheduled for this appt.

## 2020-12-10 ENCOUNTER — Inpatient Hospital Stay (HOSPITAL_BASED_OUTPATIENT_CLINIC_OR_DEPARTMENT_OTHER): Payer: PPO | Admitting: Internal Medicine

## 2020-12-10 ENCOUNTER — Telehealth: Payer: Self-pay | Admitting: Pharmacist

## 2020-12-10 ENCOUNTER — Encounter: Payer: Self-pay | Admitting: Internal Medicine

## 2020-12-10 ENCOUNTER — Encounter (HOSPITAL_COMMUNITY): Payer: Self-pay | Admitting: Neurological Surgery

## 2020-12-10 ENCOUNTER — Other Ambulatory Visit (HOSPITAL_COMMUNITY): Payer: Self-pay

## 2020-12-10 DIAGNOSIS — C711 Malignant neoplasm of frontal lobe: Secondary | ICD-10-CM

## 2020-12-10 MED ORDER — TEMOZOLOMIDE 140 MG PO CAPS
140.0000 mg | ORAL_CAPSULE | Freq: Every day | ORAL | 0 refills | Status: DC
  Filled 2020-12-10: qty 42, 42d supply, fill #0
  Filled 2020-12-16: qty 28, 28d supply, fill #0
  Filled 2021-01-11: qty 14, 14d supply, fill #1

## 2020-12-10 MED ORDER — ONDANSETRON HCL 8 MG PO TABS
8.0000 mg | ORAL_TABLET | Freq: Two times a day (BID) | ORAL | 1 refills | Status: DC | PRN
  Filled 2020-12-10 – 2020-12-16 (×2): qty 60, 30d supply, fill #0

## 2020-12-10 NOTE — Progress Notes (Signed)
START ON PATHWAY REGIMEN - Neuro     One cycle, concurrent with RT:     Temozolomide   **Always confirm dose/schedule in your pharmacy ordering system**  Patient Characteristics: Glioblastoma (Grade 4 Glioma), Newly Diagnosed / Treatment Naive, Good Performance Status and/or Younger Patient, MGMT Promoter Unmethylated/Unknown Disease Classification: Glioma Disease Classification: Glioblastoma (Grade 4 Glioma) Disease Status: Newly Diagnosed / Treatment Naive Performance Status: Good Performance Status and/or Younger Patient MGMT Promoter Methylation Status: Unmethylated Intent of Therapy: Non-Curative / Palliative Intent, Discussed with Patient

## 2020-12-10 NOTE — Telephone Encounter (Signed)
Oral Oncology Pharmacist Encounter  Prior Authorization for Temodar (temozolomide) not required at this time.    Patients co-pay is $24.98  Oral Oncology Clinic will continue to follow.   Leron Croak, PharmD, BCPS Hematology/Oncology Clinical Pharmacist [facility] [phone] 12/10/2020 4:22 PM

## 2020-12-10 NOTE — Progress Notes (Signed)
I connected with Nathan Hall on 12/10/20 at 11:30 AM EDT by telephone visit and verified that I am speaking with the correct person using two identifiers.  I discussed the limitations, risks, security and privacy concerns of performing an evaluation and management service by telemedicine and the availability of in-person appointments. I also discussed with the patient that there may be a patient responsible charge related to this service. The patient expressed understanding and agreed to proceed.  Other persons participating in the visit and their role in the encounter:  n/a  Patient's location:  Home  Provider's location:  Office  Chief Complaint:  Glioblastoma multiforme of frontal lobe ([place])  History of Present Ilness: Nathan Hall has no new complaints.  He denies new or progressive neurologic deficits, seizures or headaches. Observations: Language and cognition at baseline  Assessment and Plan: Glioblastoma multiforme of frontal lobe (HCC)  Further goals of care discussion today.  Patient wishes to proceed with full course 6 week IMRT and Temozolomide as discussed previously.  We ultimately recommended proceeding with course of intensity modulated radiation therapy and concurrent daily Temozolomide.  Radiation will be administered Mon-Fri over 6 weeks, Temodar will be dosed at 75mg /m2 to be given daily over 42 days.  We reviewed side effects of temodar, including fatigue, nausea/vomiting, constipation, and cytopenias.  Informed consent was verbally obtained at bedside to proceed with oral chemotherapy.  Chemotherapy should be held for the following:  ANC less than 1,000  Platelets less than 100,000  LFT or creatinine greater than 2x ULN  If clinical concerns/contraindications develop  Every 2 weeks during radiation, labs will be checked accompanied by a clinical evaluation in the brain tumor clinic.  Follow Up Instructions: RTC during second week of RT  I discussed the  assessment and treatment plan with the patient.  The patient was provided an opportunity to ask questions and all were answered.  The patient agreed with the plan and demonstrated understanding of the instructions.    The patient was advised to call back or seek an in-person evaluation if the symptoms worsen or if the condition fails to improve as anticipated.  I provided 5-10 minutes of non-face-to-face time during this enocunter.  Ventura Sellers, MD   I provided 15 minutes of non face-to-face telephone visit time during this encounter, and > 50% was spent counseling as documented under my assessment & plan.

## 2020-12-11 ENCOUNTER — Other Ambulatory Visit (HOSPITAL_COMMUNITY): Payer: Self-pay

## 2020-12-11 NOTE — Telephone Encounter (Signed)
Oral Oncology Pharmacist Encounter  Received new prescription for Temodar (temozolomide) for the treatment of glioblastoma multiforme in conjunction with radiation, planned duration 42 days.  Prescription dose and frequency assessed for appropriateness.   BMP and CBC w/ Diff from 11/17/20 assessed, no baseline dose adjustments required.  Current medication list in Epic reviewed, no relevant/significant DDIs with Temodar identified.  Evaluated chart and no patient barriers to medication adherence noted.   Patient agreement for treatment documented in MD note on 12/10/20.  Prescription has been e-scribed to the [facility] for benefits analysis and approval.  Oral Oncology Clinic will continue to follow for insurance authorization, copayment issues, initial counseling and start date.  Leron Croak, PharmD, BCPS Hematology/Oncology Clinical Pharmacist [facility] [phone] 12/11/2020 8:07 AM

## 2020-12-15 ENCOUNTER — Ambulatory Visit
Admission: RE | Admit: 2020-12-15 | Discharge: 2020-12-15 | Disposition: A | Discharge status: Home / Self Care | Payer: PPO | Source: Ambulatory Visit | Attending: Radiation Oncology | Admitting: Radiation Oncology

## 2020-12-15 ENCOUNTER — Telehealth: Payer: Self-pay | Admitting: Cardiology

## 2020-12-15 ENCOUNTER — Other Ambulatory Visit (HOSPITAL_COMMUNITY): Payer: Self-pay

## 2020-12-15 DIAGNOSIS — Z510 Encounter for antineoplastic radiation therapy: Secondary | ICD-10-CM | POA: Insufficient documentation

## 2020-12-15 DIAGNOSIS — C711 Malignant neoplasm of frontal lobe: Secondary | ICD-10-CM | POA: Diagnosis not present

## 2020-12-15 MED ORDER — POTASSIUM CHLORIDE CRYS ER 20 MEQ PO TBCR: 20.0000 meq | EXTENDED_RELEASE_TABLET | Freq: Every day | ORAL | 3 refills | Status: DC

## 2020-12-15 NOTE — Telephone Encounter (Signed)
*  STAT* If patient is at the pharmacy, call can be transferred to refill team.   1. Which medications need to be refilled? (please list name of each medication and dose if known)   potassium chloride SA (K-DUR,KLOR-CON) 20 MEQ tablet    2. Which pharmacy/location (including street and city if local pharmacy) is medication to be sent to? [id number] - [place], [place] - [address]  3. Do they need a 30 day or 90 day supply? 90 day supply   Pt is out of this medication.pt has a appt on 01/15/2021

## 2020-12-15 NOTE — Progress Notes (Signed)
Has armband been applied?  Yes  Does patient have an allergy to IV contrast dye?: No   Has patient ever received premedication for IV contrast dye?:  n/a  Does patient take metformin?: No  If patient does take metformin when was the last dose: n/a  Date of lab work: 11/19/2020 BUN: 14 CR: 1.02 eGfr: >60  IV site: Right Anterior Proximal Forearm  Has IV site been added to flowsheet?  Yes  BP (!) 150/70   Pulse 70   Temp 98.5 F (36.9 C)   Resp 16   Ht 5' 6" (1.676 m)   Wt 165 lb (74.8 kg)   SpO2 97%   BMI 26.63 kg/m

## 2020-12-15 NOTE — Telephone Encounter (Signed)
Refill sent In per request

## 2020-12-16 ENCOUNTER — Other Ambulatory Visit (HOSPITAL_COMMUNITY): Payer: Self-pay

## 2020-12-16 NOTE — Telephone Encounter (Signed)
Oral Chemotherapy Pharmacist Encounter   Attempted to reach patient to provide update and offer for initial counseling on oral medication: Temodar (temozolomide).   No answer. Left voicemail for patient to call back to discuss details of medication acquisition and initial counseling session.  Leron Croak, PharmD, BCPS Hematology/Oncology Clinical Pharmacist [facility] [phone] 12/16/2020 11:21 AM

## 2020-12-16 NOTE — Telephone Encounter (Signed)
Oral Chemotherapy Pharmacist Encounter  I spoke with patient and patient's wife for overview of: Temodar for the treatment of glioblastoma multiforme in conjunction with radiation, planned duration concomitant phase 42 days of therapy.   Counseled on administration, dosing, side effects, monitoring, drug-food interactions, safe handling, storage, and disposal.  Patient will take Temodar 140mg  capsules, 140mg  total daily dose, by mouth once daily, may take at bedtime and on an empty stomach to decrease nausea and vomiting.  Patient will take Temodar concurrent with radiation for 42 days straight.  Temodar start date: 12/20/20 PM Radiation start date: 12/21/20   Patient will take Zofran 8mg  tablet, 1 tablet by mouth 30-60 min prior to Temodar dose to help decrease N/V once starting adjuvant therapy. Prophylactic Zofran will not be used at initiation of concurrent phase, but will be initiated if nausea develops despite Temodar administration on an empty stomach and at bedtime.   Adverse effects include but are not limited to: nausea, vomiting, anorexia, GI upset, and fatigue. Rare but serious adverse effects of pneumocystis pneumonia and secondary malignancy also discussed.  PCP prophylaxis will not be initiated at this time, but may be added based on lymphocyte count in the future.  Reviewed with patient importance of keeping a medication schedule and plan for any missed doses. No barriers to medication adherence identified.  Medication reconciliation performed and medication/allergy list updated.  Insurance authorization for Temodar has been obtained. Test claim at the pharmacy revealed copayment $198.01 for 1st fill of Temodar. Patient will pick this up from the [facility] on 12/17/20.  Patient informed the pharmacy will reach out 5-7 days prior to needing next fill of Temodar to coordinate continued medication acquisition to prevent break in therapy.   All questions  answered.  Mr. And Ms. Waymire voiced understanding and appreciation.   Medication education handout placed in mail for patient. Patient knows to call the office with questions or concerns. Oral Chemotherapy Clinic phone number provided to patient.   Leron Croak, PharmD, BCPS Hematology/Oncology Clinical Pharmacist [facility] [phone] 12/16/2020 2:05 PM

## 2020-12-18 DIAGNOSIS — Z510 Encounter for antineoplastic radiation therapy: Secondary | ICD-10-CM | POA: Diagnosis not present

## 2020-12-18 DIAGNOSIS — C711 Malignant neoplasm of frontal lobe: Secondary | ICD-10-CM | POA: Diagnosis not present

## 2020-12-21 ENCOUNTER — Ambulatory Visit
Admission: RE | Admit: 2020-12-21 | Discharge: 2020-12-21 | Disposition: A | Discharge status: Home / Self Care | Payer: PPO | Source: Ambulatory Visit | Attending: Radiation Oncology | Admitting: Radiation Oncology

## 2020-12-21 ENCOUNTER — Other Ambulatory Visit: Payer: Self-pay

## 2020-12-21 DIAGNOSIS — Z510 Encounter for antineoplastic radiation therapy: Secondary | ICD-10-CM | POA: Diagnosis not present

## 2020-12-21 DIAGNOSIS — C711 Malignant neoplasm of frontal lobe: Secondary | ICD-10-CM | POA: Diagnosis not present

## 2020-12-22 ENCOUNTER — Telehealth: Payer: Self-pay | Admitting: Internal Medicine

## 2020-12-22 ENCOUNTER — Ambulatory Visit
Admission: RE | Admit: 2020-12-22 | Discharge: 2020-12-22 | Disposition: A | Discharge status: Home / Self Care | Payer: PPO | Source: Ambulatory Visit | Attending: Radiation Oncology | Admitting: Radiation Oncology

## 2020-12-22 DIAGNOSIS — Z510 Encounter for antineoplastic radiation therapy: Secondary | ICD-10-CM | POA: Diagnosis not present

## 2020-12-22 DIAGNOSIS — C711 Malignant neoplasm of frontal lobe: Secondary | ICD-10-CM | POA: Diagnosis not present

## 2020-12-22 NOTE — Telephone Encounter (Signed)
Scheduled appts per 7/25 sch msg. Called pt, no answer. Left msg with appts dates and times.

## 2020-12-22 NOTE — Telephone Encounter (Signed)
R/s appts to 8/1 due to pt having multiple appts already on 8/4. Pt's wife is aware of changes.

## 2020-12-23 ENCOUNTER — Other Ambulatory Visit: Payer: Self-pay

## 2020-12-23 ENCOUNTER — Ambulatory Visit
Admission: RE | Admit: 2020-12-23 | Discharge: 2020-12-23 | Disposition: A | Discharge status: Home / Self Care | Payer: PPO | Source: Ambulatory Visit | Attending: Radiation Oncology | Admitting: Radiation Oncology

## 2020-12-23 DIAGNOSIS — C711 Malignant neoplasm of frontal lobe: Secondary | ICD-10-CM | POA: Diagnosis not present

## 2020-12-23 DIAGNOSIS — Z510 Encounter for antineoplastic radiation therapy: Secondary | ICD-10-CM | POA: Diagnosis not present

## 2020-12-24 ENCOUNTER — Ambulatory Visit
Admission: RE | Admit: 2020-12-24 | Discharge: 2020-12-24 | Disposition: A | Discharge status: Home / Self Care | Payer: PPO | Source: Ambulatory Visit | Attending: Radiation Oncology | Admitting: Radiation Oncology

## 2020-12-24 DIAGNOSIS — Z510 Encounter for antineoplastic radiation therapy: Secondary | ICD-10-CM | POA: Diagnosis not present

## 2020-12-24 DIAGNOSIS — C711 Malignant neoplasm of frontal lobe: Secondary | ICD-10-CM | POA: Diagnosis not present

## 2020-12-25 ENCOUNTER — Ambulatory Visit
Admission: RE | Admit: 2020-12-25 | Discharge: 2020-12-25 | Disposition: A | Discharge status: Home / Self Care | Payer: PPO | Source: Ambulatory Visit | Attending: Radiation Oncology | Admitting: Radiation Oncology

## 2020-12-25 DIAGNOSIS — C711 Malignant neoplasm of frontal lobe: Secondary | ICD-10-CM | POA: Diagnosis not present

## 2020-12-25 DIAGNOSIS — Z510 Encounter for antineoplastic radiation therapy: Secondary | ICD-10-CM | POA: Diagnosis not present

## 2020-12-28 ENCOUNTER — Ambulatory Visit
Admission: RE | Admit: 2020-12-28 | Discharge: 2020-12-28 | Disposition: A | Discharge status: Home / Self Care | Payer: PPO | Source: Ambulatory Visit | Attending: Radiation Oncology | Admitting: Radiation Oncology

## 2020-12-28 ENCOUNTER — Inpatient Hospital Stay: Payer: PPO | Admitting: Internal Medicine

## 2020-12-28 ENCOUNTER — Other Ambulatory Visit: Payer: Self-pay

## 2020-12-28 ENCOUNTER — Inpatient Hospital Stay: Payer: PPO | Attending: Internal Medicine

## 2020-12-28 VITALS — BP 135/62 | HR 60 | Temp 98.7°F | Resp 17 | Ht 66.0 in | Wt 163.9 lb

## 2020-12-28 DIAGNOSIS — C711 Malignant neoplasm of frontal lobe: Secondary | ICD-10-CM

## 2020-12-28 DIAGNOSIS — Z510 Encounter for antineoplastic radiation therapy: Secondary | ICD-10-CM | POA: Diagnosis not present

## 2020-12-28 DIAGNOSIS — C712 Malignant neoplasm of temporal lobe: Secondary | ICD-10-CM | POA: Diagnosis not present

## 2020-12-28 DIAGNOSIS — R569 Unspecified convulsions: Secondary | ICD-10-CM

## 2020-12-28 DIAGNOSIS — Z79899 Other long term (current) drug therapy: Secondary | ICD-10-CM | POA: Diagnosis not present

## 2020-12-28 LAB — CBC WITH DIFFERENTIAL (CANCER CENTER ONLY)
Abs Immature Granulocytes: 0.02 10*3/uL (ref 0.00–0.07)
Basophils Absolute: 0 10*3/uL (ref 0.0–0.1)
Basophils Relative: 0 %
Eosinophils Absolute: 0.2 10*3/uL (ref 0.0–0.5)
Eosinophils Relative: 2 %
HCT: 40.6 % (ref 39.0–52.0)
Hemoglobin: 13.7 g/dL (ref 13.0–17.0)
Immature Granulocytes: 0 %
Lymphocytes Relative: 52 %
Lymphs Abs: 6.4 10*3/uL — ABNORMAL HIGH (ref 0.7–4.0)
MCH: 32.3 pg (ref 26.0–34.0)
MCHC: 33.7 g/dL (ref 30.0–36.0)
MCV: 95.8 fL (ref 80.0–100.0)
Monocytes Absolute: 0.8 10*3/uL (ref 0.1–1.0)
Monocytes Relative: 7 %
Neutro Abs: 4.7 10*3/uL (ref 1.7–7.7)
Neutrophils Relative %: 39 %
Platelet Count: 194 10*3/uL (ref 150–400)
RBC: 4.24 MIL/uL (ref 4.22–5.81)
RDW: 12.2 % (ref 11.5–15.5)
Smear Review: NORMAL
WBC Count: 12.2 10*3/uL — ABNORMAL HIGH (ref 4.0–10.5)
nRBC: 0 % (ref 0.0–0.2)

## 2020-12-28 LAB — CMP (CANCER CENTER ONLY)
ALT: 17 U/L (ref 0–44)
AST: 21 U/L (ref 15–41)
Albumin: 3.6 g/dL (ref 3.5–5.0)
Alkaline Phosphatase: 69 U/L (ref 38–126)
Anion gap: 5 (ref 5–15)
BUN: 13 mg/dL (ref 8–23)
CO2: 31 mmol/L (ref 22–32)
Calcium: 9.4 mg/dL (ref 8.9–10.3)
Chloride: 105 mmol/L (ref 98–111)
Creatinine: 1.17 mg/dL (ref 0.61–1.24)
GFR, Estimated: >60 mL/min (ref >60)
Glucose, Bld: 97 mg/dL (ref 70–99)
Potassium: 4.7 mmol/L (ref 3.5–5.1)
Sodium: 141 mmol/L (ref 135–145)
Total Bilirubin: 0.6 mg/dL (ref 0.3–1.2)
Total Protein: 5.9 g/dL — ABNORMAL LOW (ref 6.5–8.1)

## 2020-12-28 NOTE — Progress Notes (Signed)
[facility] [place], [place] [address] [phone]   Interval Evaluation  Date of Service: 12/28/20 Patient Name: Nathan Hall Patient MRN: [id number] Patient DOB: 07/16/1934 Provider: Ventura Sellers, MD  Identifying Statement:  Nathan Hall is a 86 y.o. male with right temporal glioblastoma   Oncologic History: Oncology History  Glioblastoma multiforme of frontal lobe ([place])  11/16/2020 Surgery   Craniotomy, resection of R temporal mass by Dr. Jearl Klinefelter; path demonstrates glioblastoma   11/16/2020 Cancer Staging   Staging form: Brain and Spinal Cord, AJCC 8th Edition - Pathologic stage from 11/16/2020: WHO Grade IV - Signed by Ventura Sellers, MD on 12/10/2020  Stage prefix: Initial diagnosis  Histologic grading system: 4 grade system  Extent of surgical resection: Complete (gross) resection  Solitary (s) or multifocal (m) tumors in the primary site: Solitary  Tumor location in brain: Noneloquent brain area  Karnofsky performance status: Score 80  Seizures at presentation: Present  Duration of symptoms before diagnosis: Short  IDH1 mutation: Negative  IDH2 mutation: Unknown  1p loss of heterozygosity (LOH): Unknown  19q loss of heterozygosity (LOH): Unknown  MGMT methylation: Absent    12/21/2020 -  Chemotherapy    Patient is on Treatment Plan: BRAIN GLIOBLASTOMA RADIATION THERAPY WITH CONCURRENT TEMOZOLOMIDE 75 MG/M2 DAILY FOLLOWED BY SEQUENTIAL MAINTENANCE TEMOZOLOMIDE X 6-12 CYCLES         Biomarkers:  MGMT Unknown.  IDH 1/2 Unknown.  EGFR Unknown  TERT Unknown   Interval History:  Nathan Hall presents today for follow up, now having completed 2 weeks of IMRT and Temodar.  His main complaint is consipation, which has treated with over the count laxative.  No fatigue issues, no depression symptoms.  Has been out on his riding mower without disability.  H+P (12/01/20) Patient presented  to medical attention on 11/13/20 with first ever seizure.  He describes involuntary nocturnal twitching of the left side of his face accompanied by slurred speech.  This resolved prior to arriving at the ED later the next morning, more or less at his baseline.  CNS imaging demonstrated enhancing mass in the right temporal lobe consistent with tumor, this was resected by Dr. Zada Finders on 11/16/20; path was glioblastoma.  Today he feels more or less at baseline, but complains of excess fatigue compared to prior to surgery.  He likes to cut grass on his riding mower.  Medications: Current Outpatient Medications on File Prior to Visit  Medication Sig Dispense Refill   aspirin EC 81 MG tablet Take 1 tablet (81 mg total) by mouth daily. Restart on 11/23/20 30 tablet 11   atorvastatin (LIPITOR) 20 MG tablet TAKE 1 TABLET BY MOUTH EVERY DAY 90 tablet 1   busPIRone (BUSPAR) 10 MG tablet Take 10 mg by mouth 2 (two) times daily as needed (anxiety attacks).     carvedilol (COREG) 25 MG tablet Take 0.5 tablets (12.5 mg total) by mouth 2 (two) times daily with a meal. 180 tablet 1   Cholecalciferol (VITAMIN D3) 2000 units capsule Take 2,000 Units by mouth daily.     CO ENZYME Q-10 PO Take 200 mg by mouth daily.     fluticasone (FLONASE) 50 MCG/ACT nasal spray Place 2 sprays into both nostrils daily.     furosemide (LASIX) 40 MG tablet Take 40 mg by mouth daily as needed for edema.     levETIRAcetam (KEPPRA) 500 MG tablet Take 1 tablet (500 mg total) by  mouth 2 (two) times daily. 60 tablet 5   mexiletine (MEXITIL) 150 MG capsule TAKE 1 CAPSULE BY MOUTH TWICE A DAY 180 capsule 2   multivitamin-lutein (OCUVITE-LUTEIN) CAPS capsule Take 1 capsule by mouth daily.     nitroGLYCERIN (NITROSTAT) 0.4 MG SL tablet Place 1 tablet under the tongue as needed for chest pain.     Omega-3 Fatty Acids (FISH OIL) 1000 MG CAPS Take 1,000 mg by mouth daily.     ondansetron (ZOFRAN) 8 MG tablet Take 1 tablet (8 mg total) by mouth 2  (two) times daily as needed (nausea and vomiting). May take 30-60 minutes prior to Temodar administration if nausea/vomiting occurs. 60 tablet 1   pantoprazole (PROTONIX) 40 MG tablet Take 40 mg by mouth daily.     potassium chloride SA (KLOR-CON) 20 MEQ tablet Take 1 tablet (20 mEq total) by mouth daily. 90 tablet 3   spironolactone (ALDACTONE) 25 MG tablet Take 12.5 mg by mouth daily.     temozolomide (TEMODAR) 140 MG capsule Take 1 capsule (140 mg total) by mouth daily. May take on an empty stomach to decrease nausea & vomiting. 42 capsule 0   No current facility-administered medications on file prior to visit.    Allergies:  Allergies  Allergen Reactions   Lisinopril Itching   Niaspan [Niacin Er] Itching   Simvastatin Itching   Past Medical History:  Past Medical History:  Diagnosis Date   Abnormal nuclear stress test 10/07/2016   Aftercare following surgery 02/22/2018   Cancer ([place])    Cardiomyopathy ([place]) 12/09/2014   Overview:  Ejection fraction 30% May 2018   Cataract    were removed   Cataract    were removed   CHF (congestive heart failure) ([place])    CLL (chronic lymphocytic leukemia) ([place])    Coronary artery disease    Coronary artery disease involving native coronary artery of native heart without angina pectoris 12/09/2014   Formatting of this note might be different from the original. LAD and Cx stents 2018 Formatting of this note might be different from the original. PTCA and drug-eluting stent to left anterior descending artery and circumflex artery in May 2018   Dyslipidemia 12/09/2014   Essential hypertension 12/09/2014   GERD (gastroesophageal reflux disease)    Hyperlipidemia    Hypertension    Ingrown nail 02/15/2018   Intermittent lightheadedness 10/13/2016   LV dysfunction 04/11/2017   Pulmonary infiltrates on CXR 01/26/2017   Onset symptoms was late June 2018 and CT scan 12/05/16 c/w as dz LLL/ clear on f/u 01/26/2017  -        PVC's (premature ventricular  contractions) 10/07/2016   Rhinitis, chronic 01/27/2017   Allergy profile 01/26/2017 >  Eos 0.1 /  IgE  53 RAST pos dust only    Past Surgical History:  Past Surgical History:  Procedure Laterality Date   APPENDECTOMY     APPLICATION OF CRANIAL NAVIGATION Right 11/16/2020   Procedure: APPLICATION OF CRANIAL NAVIGATION;  Surgeon: Judith Part, MD;  Location: [facility];  Service: Neurosurgery;  Laterality: Right;   BASAL CELL CARCINOMA EXCISION  10/31/2019   cardiac stents     CRANIOTOMY Right 11/16/2020   Procedure: CRANIOTOMY FOR TUMOR EXCISION;  Surgeon: Judith Part, MD;  Location: [facility];  Service: Neurosurgery;  Laterality: Right;   EYE SURGERY     HERNIA REPAIR     Social History:  Social History   Socioeconomic History   Marital status: Married  Spouse name: Not on file   Number of children: Not on file   Years of education: Not on file   Highest education level: Not on file  Occupational History   Not on file  Tobacco Use   Smoking status: Never   Smokeless tobacco: Never   Tobacco comments:    States years ago/did not inhale/brief period/can't remeber dates  Vaping Use   Vaping Use: Never used  Substance and Sexual Activity   Alcohol use: No   Drug use: No   Sexual activity: Not Currently  Other Topics Concern   Not on file  Social History Narrative   Not on file   Social Determinants of Health   Financial Resource Strain: Not on file  Food Insecurity: Not on file  Transportation Needs: Not on file  Physical Activity: Not on file  Stress: Not on file  Social Connections: Not on file  Intimate Partner Violence: Not on file   Family History:  Family History  Problem Relation Age of Onset   Pneumonia Mother    Stomach cancer Father    Congestive Heart Failure Father    Heart attack Maternal Grandmother    Alzheimer's disease Maternal Grandfather    Cancer Paternal Grandmother    Heart Problems Sister     Review of Systems: Constitutional:  Doesn't report fevers, chills or abnormal weight loss Eyes: Doesn't report blurriness of vision Ears, nose, mouth, throat, and face: Doesn't report sore throat Respiratory: Doesn't report cough, dyspnea or wheezes Cardiovascular: Doesn't report palpitation, chest discomfort  Gastrointestinal:  Doesn't report nausea, constipation, diarrhea GU: Doesn't report incontinence Skin: Doesn't report skin rashes Neurological: Per HPI Musculoskeletal: Doesn't report joint pain Behavioral/Psych: Doesn't report anxiety  Physical Exam: Vitals:   12/28/20 1042  BP: 135/62  Pulse: 60  Resp: 17  Temp: 98.7 F (37.1 C)  SpO2: 100%   KPS: 80. General: Alert, cooperative, pleasant, in no acute distress Head: Normal EENT: No conjunctival injection or scleral icterus.  Lungs: Resp effort normal Cardiac: Regular rate Abdomen: Non-distended abdomen Skin: No rashes cyanosis or petechiae. Extremities: No clubbing or edema  Neurologic Exam: Mental Status: Awake, alert, attentive to examiner. Oriented to self and environment. Language is fluent with intact comprehension.  Cranial Nerves: Visual acuity is grossly normal. Visual fields are full. Extra-ocular movements intact. No ptosis. Face is symmetric Motor: Tone and bulk are normal. Power is full in both arms and legs. Reflexes are symmetric, no pathologic reflexes present.  Sensory: Intact to light touch Gait: Normal.   Labs: I have reviewed the data as listed    Component Value Date/Time   NA 141 12/28/2020 1027   NA 137 05/04/2020 0000   K 4.7 12/28/2020 1027   CL 105 12/28/2020 1027   CO2 31 12/28/2020 1027   GLUCOSE 97 12/28/2020 1027   BUN 13 12/28/2020 1027   BUN 16 05/04/2020 0000   CREATININE 1.17 12/28/2020 1027   CALCIUM 9.4 12/28/2020 1027   PROT 5.9 (L) 12/28/2020 1027   PROT 6.0 03/29/2018 1103   ALBUMIN 3.6 12/28/2020 1027   ALBUMIN 4.1 03/29/2018 1103   AST 21 12/28/2020 1027   ALT 17 12/28/2020 1027   ALKPHOS 69  12/28/2020 1027   BILITOT 0.6 12/28/2020 1027   GFRNONAA >60 12/28/2020 1027   GFRAA 63 05/04/2020 0000   Lab Results  Component Value Date   WBC 12.2 (H) 12/28/2020   NEUTROABS 4.7 12/28/2020   HGB 13.7 12/28/2020   HCT 40.6 12/28/2020  MCV 95.8 12/28/2020   PLT 194 12/28/2020    Assessment/Plan Glioblastoma multiforme of frontal lobe ([place]) [C71.1]  Nathan Hall is clinically stable today, now having completed first two weeks of IMRT and Temozolomide therapy.    We ultimately recommended continuing with course of intensity modulated radiation therapy and concurrent daily Temozolomide.  Radiation will be administered Mon-Fri over 6 weeks, Temodar will be dosed at 48m/m2 to be given daily over 42 days.  We reviewed side effects of temodar, including fatigue, nausea/vomiting, constipation, and cytopenias.  Chemotherapy should be held for the following:  ANC less than 1,000  Platelets less than 100,000  LFT or creatinine greater than 2x ULN  If clinical concerns/contraindications develop  Every 2 weeks during radiation, labs will be checked accompanied by a clinical evaluation in the brain tumor clinic.  Should continue Keppra [id number], remain off dexamethasone  All questions were answered. The patient knows to call the clinic with any problems, questions or concerns. No barriers to learning were detected.  The total time spent in the encounter was 30 minutes and more than 50% was on counseling and review of test results   ZaVentura SellersMD Medical Director of Neuro-Oncology [facility] We[facility]eman8/01/22 4:30 PM

## 2020-12-29 ENCOUNTER — Ambulatory Visit
Admission: RE | Admit: 2020-12-29 | Discharge: 2020-12-29 | Disposition: A | Discharge status: Home / Self Care | Payer: PPO | Source: Ambulatory Visit | Attending: Radiation Oncology | Admitting: Radiation Oncology

## 2020-12-29 DIAGNOSIS — C711 Malignant neoplasm of frontal lobe: Secondary | ICD-10-CM | POA: Diagnosis not present

## 2020-12-29 DIAGNOSIS — Z510 Encounter for antineoplastic radiation therapy: Secondary | ICD-10-CM | POA: Diagnosis not present

## 2020-12-30 ENCOUNTER — Ambulatory Visit
Admission: RE | Admit: 2020-12-30 | Discharge: 2020-12-30 | Disposition: A | Discharge status: Home / Self Care | Payer: PPO | Source: Ambulatory Visit | Attending: Radiation Oncology | Admitting: Radiation Oncology

## 2020-12-30 ENCOUNTER — Other Ambulatory Visit: Payer: Self-pay

## 2020-12-30 DIAGNOSIS — Z510 Encounter for antineoplastic radiation therapy: Secondary | ICD-10-CM | POA: Diagnosis not present

## 2020-12-30 DIAGNOSIS — C711 Malignant neoplasm of frontal lobe: Secondary | ICD-10-CM | POA: Diagnosis not present

## 2020-12-31 ENCOUNTER — Other Ambulatory Visit: Payer: PPO

## 2020-12-31 ENCOUNTER — Ambulatory Visit: Payer: PPO | Admitting: Internal Medicine

## 2020-12-31 ENCOUNTER — Ambulatory Visit
Admission: RE | Admit: 2020-12-31 | Discharge: 2020-12-31 | Disposition: A | Discharge status: Home / Self Care | Payer: PPO | Source: Ambulatory Visit | Attending: Radiation Oncology | Admitting: Radiation Oncology

## 2020-12-31 DIAGNOSIS — C711 Malignant neoplasm of frontal lobe: Secondary | ICD-10-CM | POA: Diagnosis not present

## 2020-12-31 DIAGNOSIS — Z510 Encounter for antineoplastic radiation therapy: Secondary | ICD-10-CM | POA: Diagnosis not present

## 2021-01-01 ENCOUNTER — Ambulatory Visit
Admission: RE | Admit: 2021-01-01 | Discharge: 2021-01-01 | Disposition: A | Discharge status: Home / Self Care | Payer: PPO | Source: Ambulatory Visit | Attending: Radiation Oncology | Admitting: Radiation Oncology

## 2021-01-01 DIAGNOSIS — C711 Malignant neoplasm of frontal lobe: Secondary | ICD-10-CM | POA: Diagnosis not present

## 2021-01-01 DIAGNOSIS — Z510 Encounter for antineoplastic radiation therapy: Secondary | ICD-10-CM | POA: Diagnosis not present

## 2021-01-04 ENCOUNTER — Ambulatory Visit
Admission: RE | Admit: 2021-01-04 | Discharge: 2021-01-04 | Disposition: A | Discharge status: Home / Self Care | Payer: PPO | Source: Ambulatory Visit | Attending: Radiation Oncology | Admitting: Radiation Oncology

## 2021-01-04 ENCOUNTER — Other Ambulatory Visit: Payer: Self-pay

## 2021-01-04 DIAGNOSIS — Z510 Encounter for antineoplastic radiation therapy: Secondary | ICD-10-CM | POA: Diagnosis not present

## 2021-01-04 DIAGNOSIS — C711 Malignant neoplasm of frontal lobe: Secondary | ICD-10-CM | POA: Diagnosis not present

## 2021-01-05 ENCOUNTER — Ambulatory Visit
Admission: RE | Admit: 2021-01-05 | Discharge: 2021-01-05 | Disposition: A | Discharge status: Home / Self Care | Payer: PPO | Source: Ambulatory Visit | Attending: Radiation Oncology | Admitting: Radiation Oncology

## 2021-01-05 DIAGNOSIS — Z510 Encounter for antineoplastic radiation therapy: Secondary | ICD-10-CM | POA: Diagnosis not present

## 2021-01-05 DIAGNOSIS — C711 Malignant neoplasm of frontal lobe: Secondary | ICD-10-CM | POA: Diagnosis not present

## 2021-01-06 ENCOUNTER — Other Ambulatory Visit: Payer: Self-pay

## 2021-01-06 ENCOUNTER — Ambulatory Visit
Admission: RE | Admit: 2021-01-06 | Discharge: 2021-01-06 | Disposition: A | Discharge status: Home / Self Care | Payer: PPO | Source: Ambulatory Visit | Attending: Radiation Oncology | Admitting: Radiation Oncology

## 2021-01-06 DIAGNOSIS — C711 Malignant neoplasm of frontal lobe: Secondary | ICD-10-CM | POA: Diagnosis not present

## 2021-01-06 DIAGNOSIS — Z510 Encounter for antineoplastic radiation therapy: Secondary | ICD-10-CM | POA: Diagnosis not present

## 2021-01-07 ENCOUNTER — Ambulatory Visit
Admission: RE | Admit: 2021-01-07 | Discharge: 2021-01-07 | Disposition: A | Discharge status: Home / Self Care | Payer: PPO | Source: Ambulatory Visit | Attending: Radiation Oncology | Admitting: Radiation Oncology

## 2021-01-07 DIAGNOSIS — Z510 Encounter for antineoplastic radiation therapy: Secondary | ICD-10-CM | POA: Diagnosis not present

## 2021-01-07 DIAGNOSIS — C711 Malignant neoplasm of frontal lobe: Secondary | ICD-10-CM | POA: Diagnosis not present

## 2021-01-08 ENCOUNTER — Ambulatory Visit
Admission: RE | Admit: 2021-01-08 | Discharge: 2021-01-08 | Disposition: A | Discharge status: Home / Self Care | Payer: PPO | Source: Ambulatory Visit | Attending: Radiation Oncology | Admitting: Radiation Oncology

## 2021-01-08 ENCOUNTER — Other Ambulatory Visit: Payer: Self-pay

## 2021-01-08 DIAGNOSIS — C711 Malignant neoplasm of frontal lobe: Secondary | ICD-10-CM | POA: Diagnosis not present

## 2021-01-08 DIAGNOSIS — Z510 Encounter for antineoplastic radiation therapy: Secondary | ICD-10-CM | POA: Diagnosis not present

## 2021-01-11 ENCOUNTER — Other Ambulatory Visit (HOSPITAL_COMMUNITY): Payer: Self-pay

## 2021-01-11 ENCOUNTER — Ambulatory Visit
Admission: RE | Admit: 2021-01-11 | Discharge: 2021-01-11 | Disposition: A | Discharge status: Home / Self Care | Payer: PPO | Source: Ambulatory Visit | Attending: Radiation Oncology | Admitting: Radiation Oncology

## 2021-01-11 DIAGNOSIS — Z510 Encounter for antineoplastic radiation therapy: Secondary | ICD-10-CM | POA: Diagnosis not present

## 2021-01-11 DIAGNOSIS — C711 Malignant neoplasm of frontal lobe: Secondary | ICD-10-CM | POA: Diagnosis not present

## 2021-01-12 ENCOUNTER — Inpatient Hospital Stay: Payer: PPO | Admitting: Internal Medicine

## 2021-01-12 ENCOUNTER — Encounter: Payer: Self-pay | Admitting: Internal Medicine

## 2021-01-12 ENCOUNTER — Other Ambulatory Visit (HOSPITAL_COMMUNITY): Payer: Self-pay

## 2021-01-12 ENCOUNTER — Inpatient Hospital Stay: Payer: PPO

## 2021-01-12 ENCOUNTER — Ambulatory Visit
Admission: RE | Admit: 2021-01-12 | Discharge: 2021-01-12 | Disposition: A | Discharge status: Home / Self Care | Payer: PPO | Source: Ambulatory Visit | Attending: Radiation Oncology | Admitting: Radiation Oncology

## 2021-01-12 ENCOUNTER — Other Ambulatory Visit: Payer: Self-pay

## 2021-01-12 VITALS — BP 136/66 | HR 69 | Temp 97.7°F | Resp 18 | Wt 162.7 lb

## 2021-01-12 DIAGNOSIS — Z510 Encounter for antineoplastic radiation therapy: Secondary | ICD-10-CM | POA: Diagnosis not present

## 2021-01-12 DIAGNOSIS — C711 Malignant neoplasm of frontal lobe: Secondary | ICD-10-CM

## 2021-01-12 DIAGNOSIS — R569 Unspecified convulsions: Secondary | ICD-10-CM

## 2021-01-12 DIAGNOSIS — C712 Malignant neoplasm of temporal lobe: Secondary | ICD-10-CM | POA: Diagnosis not present

## 2021-01-12 LAB — CBC WITH DIFFERENTIAL (CANCER CENTER ONLY)
Abs Immature Granulocytes: 0.01 10*3/uL (ref 0.00–0.07)
Basophils Absolute: 0 10*3/uL (ref 0.0–0.1)
Basophils Relative: 0 %
Eosinophils Absolute: 0.1 10*3/uL (ref 0.0–0.5)
Eosinophils Relative: 1 %
HCT: 42.6 % (ref 39.0–52.0)
Hemoglobin: 13.9 g/dL (ref 13.0–17.0)
Immature Granulocytes: 0 %
Lymphocytes Relative: 52 %
Lymphs Abs: 4.6 10*3/uL — ABNORMAL HIGH (ref 0.7–4.0)
MCH: 32 pg (ref 26.0–34.0)
MCHC: 32.6 g/dL (ref 30.0–36.0)
MCV: 97.9 fL (ref 80.0–100.0)
Monocytes Absolute: 0.8 10*3/uL (ref 0.1–1.0)
Monocytes Relative: 10 %
Neutro Abs: 3.2 10*3/uL (ref 1.7–7.7)
Neutrophils Relative %: 37 %
Platelet Count: 200 10*3/uL (ref 150–400)
RBC: 4.35 MIL/uL (ref 4.22–5.81)
RDW: 12.4 % (ref 11.5–15.5)
WBC Count: 8.8 10*3/uL (ref 4.0–10.5)
nRBC: 0 % (ref 0.0–0.2)

## 2021-01-12 LAB — CMP (CANCER CENTER ONLY)
ALT: 20 U/L (ref 0–44)
AST: 25 U/L (ref 15–41)
Albumin: 3.6 g/dL (ref 3.5–5.0)
Alkaline Phosphatase: 65 U/L (ref 38–126)
Anion gap: 6 (ref 5–15)
BUN: 11 mg/dL (ref 8–23)
CO2: 30 mmol/L (ref 22–32)
Calcium: 9.2 mg/dL (ref 8.9–10.3)
Chloride: 105 mmol/L (ref 98–111)
Creatinine: 1.24 mg/dL (ref 0.61–1.24)
GFR, Estimated: 58 mL/min — ABNORMAL LOW (ref >60)
Glucose, Bld: 87 mg/dL (ref 70–99)
Potassium: 4.5 mmol/L (ref 3.5–5.1)
Sodium: 141 mmol/L (ref 135–145)
Total Bilirubin: 0.6 mg/dL (ref 0.3–1.2)
Total Protein: 5.9 g/dL — ABNORMAL LOW (ref 6.5–8.1)

## 2021-01-12 NOTE — Progress Notes (Signed)
[facility] [place], [place] [address] [phone]   Interval Evaluation  Date of Service: 01/12/21 Patient Name: Nathan Hall Patient MRN: [id number] Patient DOB: 22-Mar-1935 Provider: Ventura Sellers, MD  Identifying Statement:  Nathan Hall is a 86 y.o. male with right temporal glioblastoma   Oncologic History: Oncology History  Glioblastoma multiforme of frontal lobe ([place])  11/16/2020 Surgery   Craniotomy, resection of R temporal mass by Dr. Jearl Klinefelter; path demonstrates glioblastoma   11/16/2020 Cancer Staging   Staging form: Brain and Spinal Cord, AJCC 8th Edition - Pathologic stage from 11/16/2020: WHO Grade IV - Signed by Ventura Sellers, MD on 12/10/2020 Stage prefix: Initial diagnosis Histologic grading system: 4 grade system Extent of surgical resection: Complete (gross) resection Solitary (s) or multifocal (m) tumors in the primary site: Solitary Tumor location in brain: Noneloquent brain area Karnofsky performance status: Score 80 Seizures at presentation: Present Duration of symptoms before diagnosis: Short IDH1 mutation: Negative IDH2 mutation: Unknown 1p loss of heterozygosity (LOH): Unknown 19q loss of heterozygosity (LOH): Unknown MGMT methylation: Absent   12/21/2020 -  Chemotherapy    Patient is on Treatment Plan: BRAIN GLIOBLASTOMA RADIATION THERAPY WITH CONCURRENT TEMOZOLOMIDE 75 MG/M2 DAILY FOLLOWED BY SEQUENTIAL MAINTENANCE TEMOZOLOMIDE X 6-12 CYCLES         Biomarkers:  MGMT Unknown.  IDH 1/2 Unknown.  EGFR Unknown  TERT Unknown   Interval History:  Nathan Hall presents today for follow up, now having completed 4 weeks of IMRT and Temodar.  He does complain of increased fatigue the past week or so, with more frequent napping.  Constipation is improved. Has been out on his riding mower without disability.  H+P (12/01/20) Patient presented to medical attention on 11/13/20 with first  ever seizure.  He describes involuntary nocturnal twitching of the left side of his face accompanied by slurred speech.  This resolved prior to arriving at the ED later the next morning, more or less at his baseline.  CNS imaging demonstrated enhancing mass in the right temporal lobe consistent with tumor, this was resected by Dr. Zada Finders on 11/16/20; path was glioblastoma.  Today he feels more or less at baseline, but complains of excess fatigue compared to prior to surgery.  He likes to cut grass on his riding mower.  Medications: Current Outpatient Medications on File Prior to Visit  Medication Sig Dispense Refill   aspirin EC 81 MG tablet Take 1 tablet (81 mg total) by mouth daily. Restart on 11/23/20 30 tablet 11   atorvastatin (LIPITOR) 20 MG tablet TAKE 1 TABLET BY MOUTH EVERY DAY 90 tablet 1   busPIRone (BUSPAR) 10 MG tablet Take 10 mg by mouth 2 (two) times daily as needed (anxiety attacks).     carvedilol (COREG) 25 MG tablet Take 0.5 tablets (12.5 mg total) by mouth 2 (two) times daily with a meal. 180 tablet 1   Cholecalciferol (VITAMIN D3) 2000 units capsule Take 2,000 Units by mouth daily.     CO ENZYME Q-10 PO Take 200 mg by mouth daily.     fluticasone (FLONASE) 50 MCG/ACT nasal spray Place 2 sprays into both nostrils daily.     furosemide (LASIX) 40 MG tablet Take 40 mg by mouth daily as needed for edema.     levETIRAcetam (KEPPRA) 500 MG tablet Take 1 tablet (500 mg total) by mouth 2 (two) times daily. 60 tablet 5   mexiletine (MEXITIL) 150 MG capsule TAKE  1 CAPSULE BY MOUTH TWICE A DAY 180 capsule 2   multivitamin-lutein (OCUVITE-LUTEIN) CAPS capsule Take 1 capsule by mouth daily.     nitroGLYCERIN (NITROSTAT) 0.4 MG SL tablet Place 1 tablet under the tongue as needed for chest pain.     Omega-3 Fatty Acids (FISH OIL) 1000 MG CAPS Take 1,000 mg by mouth daily.     ondansetron (ZOFRAN) 8 MG tablet Take 1 tablet (8 mg total) by mouth 2 (two) times daily as needed (nausea and  vomiting). May take 30-60 minutes prior to Temodar administration if nausea/vomiting occurs. 60 tablet 1   pantoprazole (PROTONIX) 40 MG tablet Take 40 mg by mouth daily.     potassium chloride SA (KLOR-CON) 20 MEQ tablet Take 1 tablet (20 mEq total) by mouth daily. 90 tablet 3   spironolactone (ALDACTONE) 25 MG tablet Take 12.5 mg by mouth daily.     temozolomide (TEMODAR) 140 MG capsule Take 1 capsule (140 mg total) by mouth daily. May take on an empty stomach to decrease nausea & vomiting. 42 capsule 0   No current facility-administered medications on file prior to visit.    Allergies:  Allergies  Allergen Reactions   Lisinopril Itching   Niaspan [Niacin Er] Itching   Simvastatin Itching   Past Medical History:  Past Medical History:  Diagnosis Date   Abnormal nuclear stress test 10/07/2016   Aftercare following surgery 02/22/2018   Cancer ([place])    Cardiomyopathy ([place]) 12/09/2014   Overview:  Ejection fraction 30% May 2018   Cataract    were removed   Cataract    were removed   CHF (congestive heart failure) ([place])    CLL (chronic lymphocytic leukemia) ([place])    Coronary artery disease    Coronary artery disease involving native coronary artery of native heart without angina pectoris 12/09/2014   Formatting of this note might be different from the original. LAD and Cx stents 2018 Formatting of this note might be different from the original. PTCA and drug-eluting stent to left anterior descending artery and circumflex artery in May 2018   Dyslipidemia 12/09/2014   Essential hypertension 12/09/2014   GERD (gastroesophageal reflux disease)    Hyperlipidemia    Hypertension    Ingrown nail 02/15/2018   Intermittent lightheadedness 10/13/2016   LV dysfunction 04/11/2017   Pulmonary infiltrates on CXR 01/26/2017   Onset symptoms was late June 2018 and CT scan 12/05/16 c/w as dz LLL/ clear on f/u 01/26/2017  -        PVC's (premature ventricular contractions) 10/07/2016   Rhinitis, chronic  01/27/2017   Allergy profile 01/26/2017 >  Eos 0.1 /  IgE  53 RAST pos dust only    Past Surgical History:  Past Surgical History:  Procedure Laterality Date   APPENDECTOMY     APPLICATION OF CRANIAL NAVIGATION Right 11/16/2020   Procedure: APPLICATION OF CRANIAL NAVIGATION;  Surgeon: Judith Part, MD;  Location: [facility];  Service: Neurosurgery;  Laterality: Right;   BASAL CELL CARCINOMA EXCISION  10/31/2019   cardiac stents     CRANIOTOMY Right 11/16/2020   Procedure: CRANIOTOMY FOR TUMOR EXCISION;  Surgeon: Judith Part, MD;  Location: [facility];  Service: Neurosurgery;  Laterality: Right;   EYE SURGERY     HERNIA REPAIR     Social History:  Social History   Socioeconomic History   Marital status: Married    Spouse name: Not on file   Number of children: Not on file   Years  of education: Not on file   Highest education level: Not on file  Occupational History   Not on file  Tobacco Use   Smoking status: Never   Smokeless tobacco: Never   Tobacco comments:    States years ago/did not inhale/brief period/can't remeber dates  Vaping Use   Vaping Use: Never used  Substance and Sexual Activity   Alcohol use: No   Drug use: No   Sexual activity: Not Currently  Other Topics Concern   Not on file  Social History Narrative   Not on file   Social Determinants of Health   Financial Resource Strain: Not on file  Food Insecurity: Not on file  Transportation Needs: Not on file  Physical Activity: Not on file  Stress: Not on file  Social Connections: Not on file  Intimate Partner Violence: Not on file   Family History:  Family History  Problem Relation Age of Onset   Pneumonia Mother    Stomach cancer Father    Congestive Heart Failure Father    Heart attack Maternal Grandmother    Alzheimer's disease Maternal Grandfather    Cancer Paternal Grandmother    Heart Problems Sister     Review of Systems: Constitutional: Doesn't report fevers, chills or abnormal  weight loss Eyes: Doesn't report blurriness of vision Ears, nose, mouth, throat, and face: Doesn't report sore throat Respiratory: Doesn't report cough, dyspnea or wheezes Cardiovascular: Doesn't report palpitation, chest discomfort  Gastrointestinal:  Doesn't report nausea, constipation, diarrhea GU: Doesn't report incontinence Skin: Doesn't report skin rashes Neurological: Per HPI Musculoskeletal: Doesn't report joint pain Behavioral/Psych: Doesn't report anxiety  Physical Exam: Vitals:   01/12/21 0933  BP: 136/66  Pulse: 69  Resp: 18  Temp: 97.7 F (36.5 C)  SpO2: 99%   KPS: 80. General: Alert, cooperative, pleasant, in no acute distress Head: Normal EENT: No conjunctival injection or scleral icterus.  Lungs: Resp effort normal Cardiac: Regular rate Abdomen: Non-distended abdomen Skin: No rashes cyanosis or petechiae. Extremities: No clubbing or edema  Neurologic Exam: Mental Status: Awake, alert, attentive to examiner. Oriented to self and environment. Language is fluent with intact comprehension.  Cranial Nerves: Visual acuity is grossly normal. Visual fields are full. Extra-ocular movements intact. No ptosis. Face is symmetric Motor: Tone and bulk are normal. Power is full in both arms and legs. Reflexes are symmetric, no pathologic reflexes present.  Sensory: Intact to light touch Gait: Normal.   Labs: I have reviewed the data as listed    Component Value Date/Time   NA 141 01/12/2021 0916   NA 137 05/04/2020 0000   K 4.5 01/12/2021 0916   CL 105 01/12/2021 0916   CO2 30 01/12/2021 0916   GLUCOSE 87 01/12/2021 0916   BUN 11 01/12/2021 0916   BUN 16 05/04/2020 0000   CREATININE 1.24 01/12/2021 0916   CALCIUM 9.2 01/12/2021 0916   PROT 5.9 (L) 01/12/2021 0916   PROT 6.0 03/29/2018 1103   ALBUMIN 3.6 01/12/2021 0916   ALBUMIN 4.1 03/29/2018 1103   AST 25 01/12/2021 0916   ALT 20 01/12/2021 0916   ALKPHOS 65 01/12/2021 0916   BILITOT 0.6 01/12/2021 0916    GFRNONAA 58 (L) 01/12/2021 0916   GFRAA 63 05/04/2020 0000   Lab Results  Component Value Date   WBC 8.8 01/12/2021   NEUTROABS 3.2 01/12/2021   HGB 13.9 01/12/2021   HCT 42.6 01/12/2021   MCV 97.9 01/12/2021   PLT 200 01/12/2021    Assessment/Plan Glioblastoma multiforme  of frontal lobe ([place]) [C71.1]  Nathan Hall is clinically stable today, now having completed four weeks of IMRT and Temozolomide therapy.  No new or progressive deficits aside from expected fatigue.  We ultimately recommended continuing with course of intensity modulated radiation therapy and concurrent daily Temozolomide.  Radiation will be administered Mon-Fri over 6 weeks, Temodar will be dosed at 31m/m2 to be given daily over 42 days.  We reviewed side effects of temodar, including fatigue, nausea/vomiting, constipation, and cytopenias.  Chemotherapy should be held for the following:  ANC less than 1,000  Platelets less than 100,000  LFT or creatinine greater than 2x ULN  If clinical concerns/contraindications develop  Every 2 weeks during radiation, labs will be checked accompanied by a clinical evaluation in the brain tumor clinic.  Should continue Keppra [id number], remain off dexamethasone  All questions were answered. The patient knows to call the clinic with any problems, questions or concerns. No barriers to learning were detected.  The total time spent in the encounter was 30 minutes and more than 50% was on counseling and review of test results   ZaVentura SellersMD Medical Director of Neuro-Oncology [facility]8/16/22 9:54 AM

## 2021-01-13 ENCOUNTER — Ambulatory Visit
Admission: RE | Admit: 2021-01-13 | Discharge: 2021-01-13 | Disposition: A | Discharge status: Home / Self Care | Payer: PPO | Source: Ambulatory Visit | Attending: Radiation Oncology | Admitting: Radiation Oncology

## 2021-01-13 DIAGNOSIS — C711 Malignant neoplasm of frontal lobe: Secondary | ICD-10-CM | POA: Diagnosis not present

## 2021-01-13 DIAGNOSIS — Z510 Encounter for antineoplastic radiation therapy: Secondary | ICD-10-CM | POA: Diagnosis not present

## 2021-01-14 ENCOUNTER — Ambulatory Visit
Admission: RE | Admit: 2021-01-14 | Discharge: 2021-01-14 | Disposition: A | Discharge status: Home / Self Care | Payer: PPO | Source: Ambulatory Visit | Attending: Radiation Oncology | Admitting: Radiation Oncology

## 2021-01-14 ENCOUNTER — Other Ambulatory Visit: Payer: Self-pay

## 2021-01-14 DIAGNOSIS — R7989 Other specified abnormal findings of blood chemistry: Secondary | ICD-10-CM | POA: Insufficient documentation

## 2021-01-14 DIAGNOSIS — J339 Nasal polyp, unspecified: Secondary | ICD-10-CM | POA: Insufficient documentation

## 2021-01-14 DIAGNOSIS — Z510 Encounter for antineoplastic radiation therapy: Secondary | ICD-10-CM | POA: Diagnosis not present

## 2021-01-14 DIAGNOSIS — R209 Unspecified disturbances of skin sensation: Secondary | ICD-10-CM | POA: Insufficient documentation

## 2021-01-14 DIAGNOSIS — I498 Other specified cardiac arrhythmias: Secondary | ICD-10-CM | POA: Insufficient documentation

## 2021-01-14 DIAGNOSIS — M542 Cervicalgia: Secondary | ICD-10-CM | POA: Insufficient documentation

## 2021-01-14 DIAGNOSIS — Z8679 Personal history of other diseases of the circulatory system: Secondary | ICD-10-CM | POA: Insufficient documentation

## 2021-01-14 DIAGNOSIS — R059 Cough, unspecified: Secondary | ICD-10-CM | POA: Insufficient documentation

## 2021-01-14 DIAGNOSIS — G5600 Carpal tunnel syndrome, unspecified upper limb: Secondary | ICD-10-CM | POA: Insufficient documentation

## 2021-01-14 DIAGNOSIS — IMO0002 Reserved for concepts with insufficient information to code with codable children: Secondary | ICD-10-CM | POA: Insufficient documentation

## 2021-01-14 DIAGNOSIS — C711 Malignant neoplasm of frontal lobe: Secondary | ICD-10-CM | POA: Diagnosis not present

## 2021-01-14 DIAGNOSIS — N183 Chronic kidney disease, stage 3 unspecified: Secondary | ICD-10-CM | POA: Insufficient documentation

## 2021-01-14 DIAGNOSIS — K921 Melena: Secondary | ICD-10-CM | POA: Insufficient documentation

## 2021-01-14 DIAGNOSIS — R062 Wheezing: Secondary | ICD-10-CM | POA: Insufficient documentation

## 2021-01-14 DIAGNOSIS — K4090 Unilateral inguinal hernia, without obstruction or gangrene, not specified as recurrent: Secondary | ICD-10-CM | POA: Insufficient documentation

## 2021-01-14 DIAGNOSIS — M66259 Spontaneous rupture of extensor tendons, unspecified thigh: Secondary | ICD-10-CM | POA: Insufficient documentation

## 2021-01-14 DIAGNOSIS — Z9889 Other specified postprocedural states: Secondary | ICD-10-CM | POA: Insufficient documentation

## 2021-01-14 DIAGNOSIS — H524 Presbyopia: Secondary | ICD-10-CM | POA: Insufficient documentation

## 2021-01-15 ENCOUNTER — Other Ambulatory Visit: Payer: Self-pay | Admitting: Cardiology

## 2021-01-15 ENCOUNTER — Encounter: Payer: Self-pay | Admitting: Cardiology

## 2021-01-15 ENCOUNTER — Ambulatory Visit
Admission: RE | Admit: 2021-01-15 | Discharge: 2021-01-15 | Disposition: A | Discharge status: Home / Self Care | Payer: PPO | Source: Ambulatory Visit | Attending: Radiation Oncology | Admitting: Radiation Oncology

## 2021-01-15 ENCOUNTER — Ambulatory Visit: Payer: PPO | Admitting: Cardiology

## 2021-01-15 VITALS — BP 140/72 | HR 79 | Ht 66.0 in | Wt 165.2 lb

## 2021-01-15 DIAGNOSIS — I2510 Atherosclerotic heart disease of native coronary artery without angina pectoris: Secondary | ICD-10-CM

## 2021-01-15 DIAGNOSIS — I10 Essential (primary) hypertension: Secondary | ICD-10-CM

## 2021-01-15 DIAGNOSIS — C711 Malignant neoplasm of frontal lobe: Secondary | ICD-10-CM

## 2021-01-15 DIAGNOSIS — Z510 Encounter for antineoplastic radiation therapy: Secondary | ICD-10-CM | POA: Diagnosis not present

## 2021-01-15 DIAGNOSIS — I420 Dilated cardiomyopathy: Secondary | ICD-10-CM | POA: Insufficient documentation

## 2021-01-15 DIAGNOSIS — I493 Ventricular premature depolarization: Secondary | ICD-10-CM

## 2021-01-15 MED ORDER — NITROGLYCERIN 0.4 MG SL SUBL: 0.4000 mg | SUBLINGUAL_TABLET | SUBLINGUAL | 3 refills | Status: AC | PRN

## 2021-01-15 MED ORDER — FUROSEMIDE 40 MG PO TABS: 40.0000 mg | ORAL_TABLET | Freq: Every day | ORAL | 3 refills | Status: AC | PRN

## 2021-01-15 MED ORDER — LOSARTAN POTASSIUM 25 MG PO TABS: 25.0000 mg | ORAL_TABLET | Freq: Every day | ORAL | 3 refills | Status: DC

## 2021-01-15 MED ORDER — POTASSIUM CHLORIDE CRYS ER 20 MEQ PO TBCR: 20.0000 meq | EXTENDED_RELEASE_TABLET | Freq: Every day | ORAL | 3 refills | Status: AC

## 2021-01-15 MED ORDER — FISH OIL 1000 MG PO CAPS: 1000.0000 mg | ORAL_CAPSULE | Freq: Every day | ORAL | 3 refills | Status: AC

## 2021-01-15 MED ORDER — ASPIRIN EC 81 MG PO TBEC: 81.0000 mg | DELAYED_RELEASE_TABLET | Freq: Every day | ORAL | 3 refills | Status: AC

## 2021-01-15 MED ORDER — CARVEDILOL 25 MG PO TABS: 12.5000 mg | ORAL_TABLET | Freq: Two times a day (BID) | ORAL | 3 refills | Status: DC

## 2021-01-15 MED ORDER — SPIRONOLACTONE 25 MG PO TABS: 12.5000 mg | ORAL_TABLET | Freq: Every day | ORAL | 3 refills | Status: AC

## 2021-01-15 MED ORDER — ATORVASTATIN CALCIUM 20 MG PO TABS: 20.0000 mg | ORAL_TABLET | Freq: Every day | ORAL | 3 refills | Status: AC

## 2021-01-15 NOTE — Progress Notes (Signed)
Cardiology Office Note:    Date:  01/15/2021   ID:  Nathan Hall, DOB 07-06-1934, MRN [id number]  PCP:  Street, Sharon Mt, MD  Cardiologist:  Jenne Campus, MD    Referring MD: [address], Sharon Mt, *   No chief complaint on file. I had seizures and brain cancer  History of Present Illness:    Nathan Hall is a 86 y.o. male with past medical history significant for remote coronary artery disease, status post microinfarction 2000, dyslipidemia, cardiomyopathy with ejection fraction [place] 45%, essential hypertension, frequent PVCs successfully treated with beta-blocker and mexiletine.  Recently he was discovered to have a glioblastoma on his brain.  Apparently what triggered this was a seizures MRI CT was done and he was found to have tumor he did have surgery and going through chemotherapy and radiation therapy.  Cardiac wise seems to be doing well.  Echocardiogram repeated showed ejection fraction 4045%.  Past Medical History:  Diagnosis Date   Abnormal nuclear stress test 10/07/2016   Aftercare following surgery 02/22/2018   Cancer ([place])    Cardiomyopathy ([place]) 12/09/2014   Overview:  Ejection fraction 30% May 2018   Cataract    were removed   Cataract    were removed   CHF (congestive heart failure) ([place])    CLL (chronic lymphocytic leukemia) ([place])    Coronary artery disease    Coronary artery disease involving native coronary artery of native heart without angina pectoris 12/09/2014   Formatting of this note might be different from the original. LAD and Cx stents 2018 Formatting of this note might be different from the original. PTCA and drug-eluting stent to left anterior descending artery and circumflex artery in May 2018   Dyslipidemia 12/09/2014   Essential hypertension 12/09/2014   GERD (gastroesophageal reflux disease)    Hyperlipidemia    Hypertension    Ingrown nail 02/15/2018   Intermittent lightheadedness 10/13/2016   LV dysfunction 04/11/2017   Pulmonary  infiltrates on CXR 01/26/2017   Onset symptoms was late June 2018 and CT scan 12/05/16 c/w as dz LLL/ clear on f/u 01/26/2017  -        PVC's (premature ventricular contractions) 10/07/2016   Rhinitis, chronic 01/27/2017   Allergy profile 01/26/2017 >  Eos 0.1 /  IgE  53 RAST pos dust only     Past Surgical History:  Procedure Laterality Date   APPENDECTOMY     APPLICATION OF CRANIAL NAVIGATION Right 11/16/2020   Procedure: APPLICATION OF CRANIAL NAVIGATION;  Surgeon: Judith Part, MD;  Location: [facility];  Service: Neurosurgery;  Laterality: Right;   BASAL CELL CARCINOMA EXCISION  10/31/2019   cardiac stents     CRANIOTOMY Right 11/16/2020   Procedure: CRANIOTOMY FOR TUMOR EXCISION;  Surgeon: Judith Part, MD;  Location: [facility];  Service: Neurosurgery;  Laterality: Right;   EYE SURGERY     HERNIA REPAIR      Current Medications: Current Meds  Medication Sig   busPIRone (BUSPAR) 10 MG tablet Take 10 mg by mouth 2 (two) times daily as needed (anxiety attacks).   Cholecalciferol (VITAMIN D3) 2000 units capsule Take 2,000 Units by mouth daily.   CO ENZYME Q-10 PO Take 200 mg by mouth daily.   fluticasone (FLONASE) 50 MCG/ACT nasal spray Place 2 sprays into both nostrils daily.   levETIRAcetam (KEPPRA) 500 MG tablet Take 1 tablet (500 mg total) by mouth 2 (two) times daily.   losartan (COZAAR) 25 MG tablet Take 1 tablet (25  mg total) by mouth daily.   mexiletine (MEXITIL) 150 MG capsule TAKE 1 CAPSULE BY MOUTH TWICE A DAY   multivitamin-lutein (OCUVITE-LUTEIN) CAPS capsule Take 1 capsule by mouth daily.   ondansetron (ZOFRAN) 8 MG tablet Take 1 tablet (8 mg total) by mouth 2 (two) times daily as needed (nausea and vomiting). May take 30-60 minutes prior to Temodar administration if nausea/vomiting occurs.   pantoprazole (PROTONIX) 40 MG tablet Take 40 mg by mouth daily.   temozolomide (TEMODAR) 140 MG capsule Take 1 capsule (140 mg total) by mouth daily. May take on an empty stomach to  decrease nausea & vomiting.   [DISCONTINUED] aspirin EC 81 MG tablet Take 1 tablet (81 mg total) by mouth daily. Restart on 11/23/20   [DISCONTINUED] atorvastatin (LIPITOR) 20 MG tablet TAKE 1 TABLET BY MOUTH EVERY DAY   [DISCONTINUED] carvedilol (COREG) 25 MG tablet Take 0.5 tablets (12.5 mg total) by mouth 2 (two) times daily with a meal.   [DISCONTINUED] furosemide (LASIX) 40 MG tablet Take 40 mg by mouth daily as needed for edema.   [DISCONTINUED] nitroGLYCERIN (NITROSTAT) 0.4 MG SL tablet Place 1 tablet under the tongue as needed for chest pain.   [DISCONTINUED] Omega-3 Fatty Acids (FISH OIL) 1000 MG CAPS Take 1,000 mg by mouth daily.   [DISCONTINUED] potassium chloride SA (KLOR-CON) 20 MEQ tablet Take 1 tablet (20 mEq total) by mouth daily.   [DISCONTINUED] spironolactone (ALDACTONE) 25 MG tablet Take 12.5 mg by mouth daily.     Allergies:   Lisinopril, Niaspan [niacin er], and Simvastatin   Social History   Socioeconomic History   Marital status: Married    Spouse name: Not on file   Number of children: Not on file   Years of education: Not on file   Highest education level: Not on file  Occupational History   Not on file  Tobacco Use   Smoking status: Never   Smokeless tobacco: Never   Tobacco comments:    States years ago/did not inhale/brief period/can't remeber dates  Vaping Use   Vaping Use: Never used  Substance and Sexual Activity   Alcohol use: No   Drug use: No   Sexual activity: Not Currently  Other Topics Concern   Not on file  Social History Narrative   Not on file   Social Determinants of Health   Financial Resource Strain: Not on file  Food Insecurity: Not on file  Transportation Needs: Not on file  Physical Activity: Not on file  Stress: Not on file  Social Connections: Not on file     Family History: The patient's family history includes Alzheimer's disease in his maternal grandfather; Cancer in his paternal grandmother; Congestive Heart Failure  in his father; Heart Problems in his sister; Heart attack in his maternal grandmother; Pneumonia in his mother; Stomach cancer in his father. ROS:   Please see the history of present illness.    All 14 point review of systems negative except as described per history of present illness  EKGs/Labs/Other Studies Reviewed:      Recent Labs: 01/12/2021: ALT 20; BUN 11; Creatinine 1.24; Hemoglobin 13.9; Platelet Count 200; Potassium 4.5; Sodium 141  Recent Lipid Panel    Component Value Date/Time   CHOL 99 11/13/2020 1029   CHOL 152 11/05/2019 0813   TRIG 46 11/13/2020 1029   HDL 40 (L) 11/13/2020 1029   HDL 48 11/05/2019 0813   CHOLHDL 2.5 11/13/2020 1029   VLDL 9 11/13/2020 1029   LDLCALC 50  11/13/2020 1029   [place] 88 11/05/2019 0813    Physical Exam:    VS:  BP 140/72 (BP Location: Right Arm, Patient Position: Sitting, Cuff Size: Normal)   Pulse 79   Ht '5\' 6"'$  (1.676 m)   Wt 165 lb 3.2 oz (74.9 kg)   SpO2 98%   BMI 26.66 kg/m     Wt Readings from Last 3 Encounters:  01/15/21 165 lb 3.2 oz (74.9 kg)  01/12/21 162 lb 11.2 oz (73.8 kg)  12/28/20 163 lb 14.4 oz (74.3 kg)     GEN:  Well nourished, well developed in no acute distress HEENT: Normal NECK: No JVD; No carotid bruits LYMPHATICS: No lymphadenopathy CARDIAC: RRR, no murmurs, no rubs, no gallops RESPIRATORY:  Clear to auscultation without rales, wheezing or rhonchi  ABDOMEN: Soft, non-tender, non-distended MUSCULOSKELETAL:  No edema; No deformity  SKIN: Warm and dry LOWER EXTREMITIES: no swelling NEUROLOGIC:  Alert and oriented x 3 PSYCHIATRIC:  Normal affect   ASSESSMENT:    1. Essential (primary) hypertension   2. Frequent PVCs   3. Coronary artery disease involving native coronary artery of native heart without angina pectoris   4. Dilated cardiomyopathy ([place])   5. Glioblastoma multiforme of frontal lobe ([facility])    PLAN:    In order of problems listed above:  Frequent ventricular ectopy seems to be  suppressed with mexiletine and beta-blocker will continue. Cardiomyopathy I will try to put him on ARB.  We will start with losartan 25 mg daily week later he will have Chem-7. History of coronary artery disease seems to be stable Glioblastoma new discovery I did review note by oncologist.   Medication Adjustments/Labs and Tests Ordered: Current medicines are reviewed at length with the patient today.  Concerns regarding medicines are outlined above.  Orders Placed This Encounter  Procedures   EKG 12-Lead   Medication changes:  Meds ordered this encounter  Medications   atorvastatin (LIPITOR) 20 MG tablet    Sig: Take 1 tablet (20 mg total) by mouth daily.    Dispense:  90 tablet    Refill:  3    R   nitroGLYCERIN (NITROSTAT) 0.4 MG SL tablet    Sig: Place 1 tablet (0.4 mg total) under the tongue as needed for chest pain (For chest pain).    Dispense:  30 tablet    Refill:  3   Omega-3 Fatty Acids (FISH OIL) 1000 MG CAPS    Sig: Take 1 capsule (1,000 mg total) by mouth daily.    Dispense:  90 capsule    Refill:  3   carvedilol (COREG) 25 MG tablet    Sig: Take 0.5 tablets (12.5 mg total) by mouth 2 (two) times daily with a meal.    Dispense:  90 tablet    Refill:  3   spironolactone (ALDACTONE) 25 MG tablet    Sig: Take 0.5 tablets (12.5 mg total) by mouth daily.    Dispense:  45 tablet    Refill:  3   potassium chloride SA (KLOR-CON) 20 MEQ tablet    Sig: Take 1 tablet (20 mEq total) by mouth daily.    Dispense:  90 tablet    Refill:  3   furosemide (LASIX) 40 MG tablet    Sig: Take 1 tablet (40 mg total) by mouth daily as needed for edema.    Dispense:  90 tablet    Refill:  3   aspirin EC 81 MG tablet    Sig: Take  1 tablet (81 mg total) by mouth daily. Restart on 11/23/20    Dispense:  90 tablet    Refill:  3   losartan (COZAAR) 25 MG tablet    Sig: Take 1 tablet (25 mg total) by mouth daily.    Dispense:  90 tablet    Refill:  3    Signed, Park Liter, MD, [facility] 01/15/2021 4:03 PM    [facility]

## 2021-01-15 NOTE — Patient Instructions (Addendum)

## 2021-01-18 ENCOUNTER — Ambulatory Visit
Admission: RE | Admit: 2021-01-18 | Discharge: 2021-01-18 | Disposition: A | Discharge status: Home / Self Care | Payer: PPO | Source: Ambulatory Visit | Attending: Radiation Oncology | Admitting: Radiation Oncology

## 2021-01-18 ENCOUNTER — Other Ambulatory Visit: Payer: Self-pay

## 2021-01-18 ENCOUNTER — Other Ambulatory Visit (HOSPITAL_COMMUNITY): Payer: Self-pay

## 2021-01-18 DIAGNOSIS — Z510 Encounter for antineoplastic radiation therapy: Secondary | ICD-10-CM | POA: Diagnosis not present

## 2021-01-18 DIAGNOSIS — C711 Malignant neoplasm of frontal lobe: Secondary | ICD-10-CM | POA: Diagnosis not present

## 2021-01-19 ENCOUNTER — Ambulatory Visit
Admission: RE | Admit: 2021-01-19 | Discharge: 2021-01-19 | Disposition: A | Discharge status: Home / Self Care | Payer: PPO | Source: Ambulatory Visit | Attending: Radiation Oncology | Admitting: Radiation Oncology

## 2021-01-19 DIAGNOSIS — C711 Malignant neoplasm of frontal lobe: Secondary | ICD-10-CM | POA: Diagnosis not present

## 2021-01-19 DIAGNOSIS — Z510 Encounter for antineoplastic radiation therapy: Secondary | ICD-10-CM | POA: Diagnosis not present

## 2021-01-20 ENCOUNTER — Other Ambulatory Visit: Payer: Self-pay

## 2021-01-20 ENCOUNTER — Ambulatory Visit
Admission: RE | Admit: 2021-01-20 | Discharge: 2021-01-20 | Disposition: A | Discharge status: Home / Self Care | Payer: PPO | Source: Ambulatory Visit | Attending: Radiation Oncology | Admitting: Radiation Oncology

## 2021-01-20 DIAGNOSIS — Z510 Encounter for antineoplastic radiation therapy: Secondary | ICD-10-CM | POA: Diagnosis not present

## 2021-01-20 DIAGNOSIS — C711 Malignant neoplasm of frontal lobe: Secondary | ICD-10-CM | POA: Diagnosis not present

## 2021-01-21 ENCOUNTER — Ambulatory Visit
Admission: RE | Admit: 2021-01-21 | Discharge: 2021-01-21 | Disposition: A | Discharge status: Home / Self Care | Payer: PPO | Source: Ambulatory Visit | Attending: Radiation Oncology | Admitting: Radiation Oncology

## 2021-01-21 DIAGNOSIS — C711 Malignant neoplasm of frontal lobe: Secondary | ICD-10-CM | POA: Diagnosis not present

## 2021-01-21 DIAGNOSIS — Z510 Encounter for antineoplastic radiation therapy: Secondary | ICD-10-CM | POA: Diagnosis not present

## 2021-01-22 ENCOUNTER — Other Ambulatory Visit: Payer: Self-pay

## 2021-01-22 ENCOUNTER — Ambulatory Visit
Admission: RE | Admit: 2021-01-22 | Discharge: 2021-01-22 | Disposition: A | Discharge status: Home / Self Care | Payer: PPO | Source: Ambulatory Visit | Attending: Radiation Oncology | Admitting: Radiation Oncology

## 2021-01-22 DIAGNOSIS — C711 Malignant neoplasm of frontal lobe: Secondary | ICD-10-CM | POA: Diagnosis not present

## 2021-01-22 DIAGNOSIS — Z510 Encounter for antineoplastic radiation therapy: Secondary | ICD-10-CM | POA: Diagnosis not present

## 2021-01-25 ENCOUNTER — Ambulatory Visit
Admission: RE | Admit: 2021-01-25 | Discharge: 2021-01-25 | Disposition: A | Discharge status: Home / Self Care | Payer: PPO | Source: Ambulatory Visit | Attending: Radiation Oncology | Admitting: Radiation Oncology

## 2021-01-25 ENCOUNTER — Other Ambulatory Visit: Payer: Self-pay

## 2021-01-25 DIAGNOSIS — C711 Malignant neoplasm of frontal lobe: Secondary | ICD-10-CM | POA: Diagnosis not present

## 2021-01-25 DIAGNOSIS — Z510 Encounter for antineoplastic radiation therapy: Secondary | ICD-10-CM | POA: Diagnosis not present

## 2021-01-26 ENCOUNTER — Ambulatory Visit
Admission: RE | Admit: 2021-01-26 | Discharge: 2021-01-26 | Disposition: A | Discharge status: Home / Self Care | Payer: PPO | Source: Ambulatory Visit | Attending: Radiation Oncology | Admitting: Radiation Oncology

## 2021-01-26 DIAGNOSIS — Z510 Encounter for antineoplastic radiation therapy: Secondary | ICD-10-CM | POA: Diagnosis not present

## 2021-01-26 DIAGNOSIS — C711 Malignant neoplasm of frontal lobe: Secondary | ICD-10-CM | POA: Diagnosis not present

## 2021-01-27 ENCOUNTER — Ambulatory Visit
Admission: RE | Admit: 2021-01-27 | Discharge: 2021-01-27 | Disposition: A | Discharge status: Home / Self Care | Payer: PPO | Source: Ambulatory Visit | Attending: Radiation Oncology | Admitting: Radiation Oncology

## 2021-01-27 ENCOUNTER — Other Ambulatory Visit: Payer: Self-pay

## 2021-01-27 DIAGNOSIS — Z510 Encounter for antineoplastic radiation therapy: Secondary | ICD-10-CM | POA: Diagnosis not present

## 2021-01-27 DIAGNOSIS — C711 Malignant neoplasm of frontal lobe: Secondary | ICD-10-CM | POA: Diagnosis not present

## 2021-01-28 ENCOUNTER — Inpatient Hospital Stay: Payer: PPO | Attending: Internal Medicine

## 2021-01-28 ENCOUNTER — Inpatient Hospital Stay: Payer: PPO | Admitting: Internal Medicine

## 2021-01-28 ENCOUNTER — Ambulatory Visit
Admission: RE | Admit: 2021-01-28 | Discharge: 2021-01-28 | Disposition: A | Discharge status: Home / Self Care | Payer: PPO | Source: Ambulatory Visit | Attending: Radiation Oncology | Admitting: Radiation Oncology

## 2021-01-28 VITALS — BP 115/51 | HR 65 | Temp 98.0°F | Resp 17 | Ht 66.0 in | Wt 161.6 lb

## 2021-01-28 DIAGNOSIS — Z79899 Other long term (current) drug therapy: Secondary | ICD-10-CM | POA: Insufficient documentation

## 2021-01-28 DIAGNOSIS — C711 Malignant neoplasm of frontal lobe: Secondary | ICD-10-CM

## 2021-01-28 DIAGNOSIS — C9110 Chronic lymphocytic leukemia of B-cell type not having achieved remission: Secondary | ICD-10-CM | POA: Diagnosis not present

## 2021-01-28 DIAGNOSIS — I509 Heart failure, unspecified: Secondary | ICD-10-CM | POA: Diagnosis not present

## 2021-01-28 DIAGNOSIS — R112 Nausea with vomiting, unspecified: Secondary | ICD-10-CM | POA: Insufficient documentation

## 2021-01-28 DIAGNOSIS — R569 Unspecified convulsions: Secondary | ICD-10-CM | POA: Diagnosis not present

## 2021-01-28 DIAGNOSIS — Z510 Encounter for antineoplastic radiation therapy: Secondary | ICD-10-CM | POA: Insufficient documentation

## 2021-01-28 DIAGNOSIS — C712 Malignant neoplasm of temporal lobe: Secondary | ICD-10-CM | POA: Insufficient documentation

## 2021-01-28 LAB — CBC WITH DIFFERENTIAL (CANCER CENTER ONLY)
Abs Immature Granulocytes: 0.02 10*3/uL (ref 0.00–0.07)
Basophils Absolute: 0 10*3/uL (ref 0.0–0.1)
Basophils Relative: 0 %
Eosinophils Absolute: 0.1 10*3/uL (ref 0.0–0.5)
Eosinophils Relative: 2 %
HCT: 42 % (ref 39.0–52.0)
Hemoglobin: 13.9 g/dL (ref 13.0–17.0)
Immature Granulocytes: 0 %
Lymphocytes Relative: 43 %
Lymphs Abs: 2.8 10*3/uL (ref 0.7–4.0)
MCH: 32 pg (ref 26.0–34.0)
MCHC: 33.1 g/dL (ref 30.0–36.0)
MCV: 96.6 fL (ref 80.0–100.0)
Monocytes Absolute: 0.7 10*3/uL (ref 0.1–1.0)
Monocytes Relative: 11 %
Neutro Abs: 2.9 10*3/uL (ref 1.7–7.7)
Neutrophils Relative %: 44 %
Platelet Count: 171 10*3/uL (ref 150–400)
RBC: 4.35 MIL/uL (ref 4.22–5.81)
RDW: 12.5 % (ref 11.5–15.5)
WBC Count: 6.5 10*3/uL (ref 4.0–10.5)
nRBC: 0 % (ref 0.0–0.2)

## 2021-01-28 LAB — CMP (CANCER CENTER ONLY)
ALT: 15 U/L (ref 0–44)
AST: 20 U/L (ref 15–41)
Albumin: 3.7 g/dL (ref 3.5–5.0)
Alkaline Phosphatase: 65 U/L (ref 38–126)
Anion gap: 7 (ref 5–15)
BUN: 11 mg/dL (ref 8–23)
CO2: 30 mmol/L (ref 22–32)
Calcium: 9.2 mg/dL (ref 8.9–10.3)
Chloride: 105 mmol/L (ref 98–111)
Creatinine: 1.18 mg/dL (ref 0.61–1.24)
GFR, Estimated: >60 mL/min (ref >60)
Glucose, Bld: 84 mg/dL (ref 70–99)
Potassium: 4.2 mmol/L (ref 3.5–5.1)
Sodium: 142 mmol/L (ref 135–145)
Total Bilirubin: 0.8 mg/dL (ref 0.3–1.2)
Total Protein: 6 g/dL — ABNORMAL LOW (ref 6.5–8.1)

## 2021-01-28 NOTE — Progress Notes (Signed)
[facility] [address] [place], [place] [address] [phone]   Interval Evaluation  Date of Service: 01/28/21 Patient Name: Nathan Hall Patient MRN: [id number] Patient DOB: 06-May-1935 Provider: Ventura Sellers, MD  Identifying Statement:  Nathan Hall is a 86 y.o. male with right temporal glioblastoma   Oncologic History: Oncology History  Glioblastoma multiforme of frontal lobe ([place])  11/16/2020 Surgery   Craniotomy, resection of R temporal mass by Dr. Jearl Klinefelter; path demonstrates glioblastoma   11/16/2020 Cancer Staging   Staging form: Brain and Spinal Cord, AJCC 8th Edition - Pathologic stage from 11/16/2020: WHO Grade IV - Signed by Ventura Sellers, MD on 12/10/2020 Stage prefix: Initial diagnosis Histologic grading system: 4 grade system Extent of surgical resection: Complete (gross) resection Solitary (s) or multifocal (m) tumors in the primary site: Solitary Tumor location in brain: Noneloquent brain area Karnofsky performance status: Score 80 Seizures at presentation: Present Duration of symptoms before diagnosis: Short IDH1 mutation: Negative IDH2 mutation: Unknown 1p loss of heterozygosity (LOH): Unknown 19q loss of heterozygosity (LOH): Unknown MGMT methylation: Absent   12/21/2020 -  Chemotherapy    Patient is on Treatment Plan: BRAIN GLIOBLASTOMA RADIATION THERAPY WITH CONCURRENT TEMOZOLOMIDE 75 MG/M2 DAILY FOLLOWED BY SEQUENTIAL MAINTENANCE TEMOZOLOMIDE X 6-12 CYCLES         Biomarkers:  MGMT Unknown.  IDH 1/2 Unknown.  EGFR Unknown  TERT Unknown   Interval History:  Nathan Hall presents today for follow up, now in final week of IMRT and Temodar.  Fatigue symptoms continue to progress somewhat, he is noticeable more tired than before radiation.  Constipation is improved. Has been out on his riding mower without disability.  H+P (12/01/20) Patient presented to medical attention on 11/13/20 with first  ever seizure.  He describes involuntary nocturnal twitching of the left side of his face accompanied by slurred speech.  This resolved prior to arriving at the ED later the next morning, more or less at his baseline.  CNS imaging demonstrated enhancing mass in the right temporal lobe consistent with tumor, this was resected by Dr. Zada Finders on 11/16/20; path was glioblastoma.  Today he feels more or less at baseline, but complains of excess fatigue compared to prior to surgery.  He likes to cut grass on his riding mower.  Medications: Current Outpatient Medications on File Prior to Visit  Medication Sig Dispense Refill   aspirin EC 81 MG tablet Take 1 tablet (81 mg total) by mouth daily. Restart on 11/23/20 90 tablet 3   atorvastatin (LIPITOR) 20 MG tablet Take 1 tablet (20 mg total) by mouth daily. 90 tablet 3   busPIRone (BUSPAR) 10 MG tablet Take 10 mg by mouth 2 (two) times daily as needed (anxiety attacks).     carvedilol (COREG) 6.25 MG tablet Take 1 tablet (6.25 mg total) by mouth 2 (two) times daily with a meal. 180 tablet 3   Cholecalciferol (VITAMIN D3) 2000 units capsule Take 2,000 Units by mouth daily.     CO ENZYME Q-10 PO Take 200 mg by mouth daily.     fluticasone (FLONASE) 50 MCG/ACT nasal spray Place 2 sprays into both nostrils daily.     furosemide (LASIX) 40 MG tablet Take 1 tablet (40 mg total) by mouth daily as needed for edema. 90 tablet 3   levETIRAcetam (KEPPRA) 500 MG tablet Take 1 tablet (500 mg total) by mouth 2 (two) times daily. 60 tablet 5   mexiletine (MEXITIL)  150 MG capsule TAKE 1 CAPSULE BY MOUTH TWICE A DAY 180 capsule 2   multivitamin-lutein (OCUVITE-LUTEIN) CAPS capsule Take 1 capsule by mouth daily.     nitroGLYCERIN (NITROSTAT) 0.4 MG SL tablet Place 1 tablet (0.4 mg total) under the tongue as needed for chest pain (For chest pain). 30 tablet 3   Omega-3 Fatty Acids (FISH OIL) 1000 MG CAPS Take 1 capsule (1,000 mg total) by mouth daily. 90 capsule 3    ondansetron (ZOFRAN) 8 MG tablet Take 1 tablet (8 mg total) by mouth 2 (two) times daily as needed (nausea and vomiting). May take 30-60 minutes prior to Temodar administration if nausea/vomiting occurs. 60 tablet 1   pantoprazole (PROTONIX) 40 MG tablet Take 40 mg by mouth daily.     potassium chloride SA (KLOR-CON) 20 MEQ tablet Take 1 tablet (20 mEq total) by mouth daily. 90 tablet 3   spironolactone (ALDACTONE) 25 MG tablet Take 0.5 tablets (12.5 mg total) by mouth daily. 45 tablet 3   temozolomide (TEMODAR) 140 MG capsule Take 1 capsule (140 mg total) by mouth daily. May take on an empty stomach to decrease nausea & vomiting. 42 capsule 0   No current facility-administered medications on file prior to visit.    Allergies:  Allergies  Allergen Reactions   Lisinopril Itching   Niaspan [Niacin Er] Itching   Simvastatin Itching   Past Medical History:  Past Medical History:  Diagnosis Date   Abnormal nuclear stress test 10/07/2016   Aftercare following surgery 02/22/2018   Cancer ([place])    Cardiomyopathy ([place]) 12/09/2014   Overview:  Ejection fraction 30% May 2018   Cataract    were removed   Cataract    were removed   CHF (congestive heart failure) ([place])    CLL (chronic lymphocytic leukemia) ([place])    Coronary artery disease    Coronary artery disease involving native coronary artery of native heart without angina pectoris 12/09/2014   Formatting of this note might be different from the original. LAD and Cx stents 2018 Formatting of this note might be different from the original. PTCA and drug-eluting stent to left anterior descending artery and circumflex artery in May 2018   Dyslipidemia 12/09/2014   Essential hypertension 12/09/2014   GERD (gastroesophageal reflux disease)    Hyperlipidemia    Hypertension    Ingrown nail 02/15/2018   Intermittent lightheadedness 10/13/2016   LV dysfunction 04/11/2017   Pulmonary infiltrates on CXR 01/26/2017   Onset symptoms was late June 2018  and CT scan 12/05/16 c/w as dz LLL/ clear on f/u 01/26/2017  -        PVC's (premature ventricular contractions) 10/07/2016   Rhinitis, chronic 01/27/2017   Allergy profile 01/26/2017 >  Eos 0.1 /  IgE  53 RAST pos dust only    Past Surgical History:  Past Surgical History:  Procedure Laterality Date   APPENDECTOMY     APPLICATION OF CRANIAL NAVIGATION Right 11/16/2020   Procedure: APPLICATION OF CRANIAL NAVIGATION;  Surgeon: Judith Part, MD;  Location: [facility];  Service: Neurosurgery;  Laterality: Right;   BASAL CELL CARCINOMA EXCISION  10/31/2019   cardiac stents     CRANIOTOMY Right 11/16/2020   Procedure: CRANIOTOMY FOR TUMOR EXCISION;  Surgeon: Judith Part, MD;  Location: [place] [facility] [place];  Service: Neurosurgery;  Laterality: Right;   EYE SURGERY     HERNIA REPAIR     Social History:  Social History   Socioeconomic History   Marital status: Married  Spouse name: Not on file   Number of children: Not on file   Years of education: Not on file   Highest education level: Not on file  Occupational History   Not on file  Tobacco Use   Smoking status: Never   Smokeless tobacco: Never   Tobacco comments:    States years ago/did not inhale/brief period/can't remeber dates  Vaping Use   Vaping Use: Never used  Substance and Sexual Activity   Alcohol use: No   Drug use: No   Sexual activity: Not Currently  Other Topics Concern   Not on file  Social History Narrative   Not on file   Social Determinants of Health   Financial Resource Strain: Not on file  Food Insecurity: Not on file  Transportation Needs: Not on file  Physical Activity: Not on file  Stress: Not on file  Social Connections: Not on file  Intimate Partner Violence: Not on file   Family History:  Family History  Problem Relation Age of Onset   Pneumonia Mother    Stomach cancer Father    Congestive Heart Failure Father    Heart attack Maternal Grandmother    Alzheimer's disease Maternal Grandfather     Cancer Paternal Grandmother    Heart Problems Sister     Review of Systems: Constitutional: Doesn't report fevers, chills or abnormal weight loss Eyes: Doesn't report blurriness of vision Ears, nose, mouth, throat, and face: Doesn't report sore throat Respiratory: Doesn't report cough, dyspnea or wheezes Cardiovascular: Doesn't report palpitation, chest discomfort  Gastrointestinal:  Doesn't report nausea, constipation, diarrhea GU: Doesn't report incontinence Skin: Doesn't report skin rashes Neurological: Per HPI Musculoskeletal: Doesn't report joint pain Behavioral/Psych: Doesn't report anxiety  Physical Exam: Vitals:   01/28/21 0907  BP: (!) 115/51  Pulse: 65  Resp: 17  Temp: 98 F (36.7 C)  SpO2: 98%   KPS: 80. General: Alert, cooperative, pleasant, in no acute distress Head: Normal EENT: No conjunctival injection or scleral icterus.  Lungs: Resp effort normal Cardiac: Regular rate Abdomen: Non-distended abdomen Skin: No rashes cyanosis or petechiae. Extremities: No clubbing or edema  Neurologic Exam: Mental Status: Awake, alert, attentive to examiner. Oriented to self and environment. Language is fluent with intact comprehension.  Cranial Nerves: Visual acuity is grossly normal. Visual fields are full. Extra-ocular movements intact. No ptosis. Face is symmetric Motor: Tone and bulk are normal. Power is full in both arms and legs. Reflexes are symmetric, no pathologic reflexes present.  Sensory: Intact to light touch Gait: Normal.   Labs: I have reviewed the data as listed    Component Value Date/Time   NA 141 01/12/2021 0916   NA 137 05/04/2020 0000   K 4.5 01/12/2021 0916   CL 105 01/12/2021 0916   CO2 30 01/12/2021 0916   GLUCOSE 87 01/12/2021 0916   BUN 11 01/12/2021 0916   BUN 16 05/04/2020 0000   CREATININE 1.24 01/12/2021 0916   CALCIUM 9.2 01/12/2021 0916   PROT 5.9 (L) 01/12/2021 0916   PROT 6.0 03/29/2018 1103   ALBUMIN 3.6 01/12/2021 0916    ALBUMIN 4.1 03/29/2018 1103   AST 25 01/12/2021 0916   ALT 20 01/12/2021 0916   ALKPHOS 65 01/12/2021 0916   BILITOT 0.6 01/12/2021 0916   GFRNONAA 58 (L) 01/12/2021 0916   GFRAA 63 05/04/2020 0000   Lab Results  Component Value Date   WBC 6.5 01/28/2021   NEUTROABS 2.9 01/28/2021   HGB 13.9 01/28/2021   HCT 42.0  01/28/2021   MCV 96.6 01/28/2021   PLT 171 01/28/2021    Assessment/Plan Glioblastoma multiforme of frontal lobe ([place]) [C71.1]  Nathan Hall is clinically stable today, now in final week of IMRT and Temozolomide therapy.  Fatigue symptoms are within expected range, labs within normal limits.  We ultimately recommended completed course of intensity modulated radiation therapy and concurrent daily Temozolomide.  Radiation will be administered Mon-Fri over 6 weeks, Temodar will be dosed at 38m/m2 to be given daily over 42 days.  We reviewed side effects of temodar, including fatigue, nausea/vomiting, constipation, and cytopenias.  Chemotherapy should be held for the following:  ANC less than 1,000  Platelets less than 100,000  LFT or creatinine greater than 2x ULN  If clinical concerns/contraindications develop  Should continue Keppra 5085mBID, remain off dexamethasone.  We ask that Nathan Monacoeturn to clinic in 1 months following post-RT brain MRI, or sooner as needed.  All questions were answered. The patient knows to call the clinic with any problems, questions or concerns. No barriers to learning were detected.  The total time spent in the encounter was 30 minutes and more than 50% was on counseling and review of test results   ZaVentura SellersMD Medical Director of Neuro-Oncology [facility]Cyp[facility] WeWillard9/01/22 9:19 AM

## 2021-01-29 ENCOUNTER — Other Ambulatory Visit: Payer: Self-pay

## 2021-01-29 ENCOUNTER — Encounter: Payer: Self-pay | Admitting: Radiation Oncology

## 2021-01-29 ENCOUNTER — Telehealth: Payer: Self-pay | Admitting: Internal Medicine

## 2021-01-29 ENCOUNTER — Ambulatory Visit
Admission: RE | Admit: 2021-01-29 | Discharge: 2021-01-29 | Disposition: A | Discharge status: Home / Self Care | Payer: PPO | Source: Ambulatory Visit | Attending: Radiation Oncology | Admitting: Radiation Oncology

## 2021-01-29 DIAGNOSIS — C711 Malignant neoplasm of frontal lobe: Secondary | ICD-10-CM | POA: Diagnosis not present

## 2021-01-29 DIAGNOSIS — Z510 Encounter for antineoplastic radiation therapy: Secondary | ICD-10-CM | POA: Diagnosis not present

## 2021-01-29 NOTE — Telephone Encounter (Signed)
Scheduled appt per 9/1 los. Pt is aware.

## 2021-02-03 DIAGNOSIS — I10 Essential (primary) hypertension: Secondary | ICD-10-CM | POA: Diagnosis not present

## 2021-02-03 DIAGNOSIS — Z0000 Encounter for general adult medical examination without abnormal findings: Secondary | ICD-10-CM | POA: Diagnosis not present

## 2021-02-03 DIAGNOSIS — I420 Dilated cardiomyopathy: Secondary | ICD-10-CM | POA: Diagnosis not present

## 2021-02-03 DIAGNOSIS — K2960 Other gastritis without bleeding: Secondary | ICD-10-CM | POA: Diagnosis not present

## 2021-02-03 DIAGNOSIS — C719 Malignant neoplasm of brain, unspecified: Secondary | ICD-10-CM | POA: Diagnosis not present

## 2021-02-03 DIAGNOSIS — I472 Ventricular tachycardia: Secondary | ICD-10-CM | POA: Diagnosis not present

## 2021-02-03 DIAGNOSIS — F411 Generalized anxiety disorder: Secondary | ICD-10-CM | POA: Diagnosis not present

## 2021-02-03 DIAGNOSIS — E782 Mixed hyperlipidemia: Secondary | ICD-10-CM | POA: Diagnosis not present

## 2021-02-03 DIAGNOSIS — Z6824 Body mass index (BMI) 24.0-24.9, adult: Secondary | ICD-10-CM | POA: Diagnosis not present

## 2021-02-03 DIAGNOSIS — C9110 Chronic lymphocytic leukemia of B-cell type not having achieved remission: Secondary | ICD-10-CM | POA: Diagnosis not present

## 2021-02-08 ENCOUNTER — Encounter: Payer: Self-pay | Admitting: Internal Medicine

## 2021-02-08 NOTE — Progress Notes (Signed)
                                                                                                                                                             Patient Name: Nathan Hall MRN: [id number] DOB: 10/31/1934 Referring Physician: Emelda Brothers Date of Service: 01/29/2021 [facility], [place]                                                        End Of Treatment Note  Diagnoses: C71.1-Malignant neoplasm of frontal lobe  Cancer Staging: Glioblastoma of the right frontal lobe  Intent: Curative  Radiation Treatment Dates: 12/21/2020 through 01/29/2021 Site Technique Total Dose (Gy) Dose per Fx (Gy) Completed Fx Beam Energies  Brain: Brain IMRT 46/46 2 23/23 6X  Brain: Brain_Bst IMRT 14/14 2 7/7 6X   Narrative: The patient tolerated radiation therapy relatively well. She developed anticipated skin changes in the treatment field.   Plan: The patient will receive a call in about one month from the radiation oncology department. He will continue follow up with Dr. Mickeal Skinner as well.   ________________________________________________    Carola Rhine, [facility]

## 2021-02-11 ENCOUNTER — Other Ambulatory Visit (HOSPITAL_COMMUNITY): Payer: Self-pay

## 2021-02-12 ENCOUNTER — Telehealth: Payer: Self-pay | Admitting: Dietician

## 2021-02-12 NOTE — Telephone Encounter (Signed)
Nutrition  Patient identified on Malnutrition Screening Tool Report (+weight loss, poor appetite)  Attempted to connect with patient via telephone to introduce self and services available at [facility]. Patient did not answer. Left message with request for return call. Contact information provided.

## 2021-02-16 ENCOUNTER — Telehealth: Payer: Self-pay | Admitting: Dietician

## 2021-02-16 NOTE — Telephone Encounter (Signed)
Nutrition  Received return call from patient and his wife this morning. Patient reports his appetite is good and is eating well. Wife states that patient is "spoiled rotten" Patient reports his weights are stable on home scale and feels good at his current weight (~159-161 lb). He denies nutrition impact symptoms. Discussed services available at [facility]. Patient encouraged to contact with nutrition questions or concerns. Contact information provided.

## 2021-02-19 ENCOUNTER — Ambulatory Visit (HOSPITAL_COMMUNITY)
Admission: RE | Admit: 2021-02-19 | Discharge: 2021-02-19 | Disposition: A | Discharge status: Home / Self Care | Payer: PPO | Source: Ambulatory Visit | Attending: Internal Medicine | Admitting: Internal Medicine

## 2021-02-19 DIAGNOSIS — C711 Malignant neoplasm of frontal lobe: Secondary | ICD-10-CM | POA: Insufficient documentation

## 2021-02-19 DIAGNOSIS — J329 Chronic sinusitis, unspecified: Secondary | ICD-10-CM | POA: Diagnosis not present

## 2021-02-19 DIAGNOSIS — G319 Degenerative disease of nervous system, unspecified: Secondary | ICD-10-CM | POA: Diagnosis not present

## 2021-02-19 MED ORDER — GADOBUTROL 1 MMOL/ML IV SOLN
7.5000 mL | Freq: Once | INTRAVENOUS | Status: AC | PRN
  Administered 2021-02-19: 7.5 mL via INTRAVENOUS

## 2021-02-22 ENCOUNTER — Other Ambulatory Visit: Payer: Self-pay | Admitting: Radiation Therapy

## 2021-02-23 ENCOUNTER — Other Ambulatory Visit: Payer: Self-pay

## 2021-02-23 ENCOUNTER — Other Ambulatory Visit (HOSPITAL_COMMUNITY): Payer: Self-pay

## 2021-02-23 ENCOUNTER — Telehealth: Payer: Self-pay | Admitting: Pharmacist

## 2021-02-23 ENCOUNTER — Inpatient Hospital Stay: Payer: PPO | Admitting: Internal Medicine

## 2021-02-23 VITALS — BP 127/62 | HR 82 | Temp 98.0°F | Resp 17 | Ht 66.0 in | Wt 161.2 lb

## 2021-02-23 DIAGNOSIS — C711 Malignant neoplasm of frontal lobe: Secondary | ICD-10-CM | POA: Diagnosis not present

## 2021-02-23 DIAGNOSIS — C712 Malignant neoplasm of temporal lobe: Secondary | ICD-10-CM | POA: Diagnosis not present

## 2021-02-23 MED ORDER — ONDANSETRON HCL 8 MG PO TABS
8.0000 mg | ORAL_TABLET | Freq: Two times a day (BID) | ORAL | 1 refills | Status: DC | PRN
  Filled 2021-02-23: qty 30, 15d supply, fill #0

## 2021-02-23 MED ORDER — TEMOZOLOMIDE 140 MG PO CAPS
150.0000 mg/m2/d | ORAL_CAPSULE | Freq: Every day | ORAL | 0 refills | Status: DC
  Filled 2021-02-23: qty 10, 5d supply, fill #0
  Filled 2021-02-24: qty 10, 28d supply, fill #0

## 2021-02-23 NOTE — Progress Notes (Signed)
[facility] [place], [place] [address] [phone]   Interval Evaluation  Date of Service: 02/23/21 Patient Name: Nathan Hall Patient MRN: [id number] Patient DOB: 1934-11-26 Provider: Ventura Sellers, MD  Identifying Statement:  Nathan Hall is a 86 y.o. male with right temporal glioblastoma   Oncologic History: Oncology History  Glioblastoma multiforme of frontal lobe ([place])  11/16/2020 Surgery   Craniotomy, resection of R temporal mass by Dr. Jearl Klinefelter; path demonstrates glioblastoma   11/16/2020 Cancer Staging   Staging form: Brain and Spinal Cord, AJCC 8th Edition - Pathologic stage from 11/16/2020: WHO Grade IV - Signed by Ventura Sellers, MD on 12/10/2020 Stage prefix: Initial diagnosis Histologic grading system: 4 grade system Extent of surgical resection: Complete (gross) resection Solitary (s) or multifocal (m) tumors in the primary site: Solitary Tumor location in brain: Noneloquent brain area Karnofsky performance status: Score 80 Seizures at presentation: Present Duration of symptoms before diagnosis: Short IDH1 mutation: Negative IDH2 mutation: Unknown 1p loss of heterozygosity (LOH): Unknown 19q loss of heterozygosity (LOH): Unknown MGMT methylation: Absent   12/21/2020 - 12/21/2020 Chemotherapy   Patient is on Treatment Plan : BRAIN GLIOBLASTOMA Radiation Therapy With Concurrent Temozolomide 75 mg/m2 Daily Followed By Sequential Maintenance Temozolomide x 6-12 cycles     02/28/2021 -  Chemotherapy   Patient is on Treatment Plan : BRAIN GLIOBLASTOMA Consolidation Temozolomide Days 1-5 q28 Days        Biomarkers:  MGMT Unmethylated.  IDH 1/2 Unknown.  EGFR Unknown  TERT Unknown   Interval History: Nathan Hall presents today for follow up, now having completed IMRT and Temodar.  Fatigue symptoms continue to be an issue, he is napping daily.  Constipation is improved. Has been  out on his riding mower without disability.  No further seizures.  H+P (12/01/20) Patient presented to medical attention on 11/13/20 with first ever seizure.  He describes involuntary nocturnal twitching of the left side of his face accompanied by slurred speech.  This resolved prior to arriving at the ED later the next morning, more or less at his baseline.  CNS imaging demonstrated enhancing mass in the right temporal lobe consistent with tumor, this was resected by Dr. Zada Finders on 11/16/20; path was glioblastoma.  Today he feels more or less at baseline, but complains of excess fatigue compared to prior to surgery.  He likes to cut grass on his riding mower.  Medications: Current Outpatient Medications on File Prior to Visit  Medication Sig Dispense Refill   aspirin EC 81 MG tablet Take 1 tablet (81 mg total) by mouth daily. Restart on 11/23/20 90 tablet 3   atorvastatin (LIPITOR) 20 MG tablet Take 1 tablet (20 mg total) by mouth daily. 90 tablet 3   busPIRone (BUSPAR) 10 MG tablet Take 10 mg by mouth 2 (two) times daily as needed (anxiety attacks).     carvedilol (COREG) 6.25 MG tablet Take 1 tablet (6.25 mg total) by mouth 2 (two) times daily with a meal. 180 tablet 3   Cholecalciferol (VITAMIN D3) 2000 units capsule Take 2,000 Units by mouth daily.     CO ENZYME Q-10 PO Take 200 mg by mouth daily.     fluticasone (FLONASE) 50 MCG/ACT nasal spray Place 2 sprays into both nostrils daily.     furosemide (LASIX) 40 MG tablet Take 1 tablet (40 mg total) by mouth daily as needed for edema. 90 tablet 3   levETIRAcetam (KEPPRA) 500  MG tablet Take 1 tablet (500 mg total) by mouth 2 (two) times daily. 60 tablet 5   mexiletine (MEXITIL) 150 MG capsule TAKE 1 CAPSULE BY MOUTH TWICE A DAY 180 capsule 2   multivitamin-lutein (OCUVITE-LUTEIN) CAPS capsule Take 1 capsule by mouth daily.     Omega-3 Fatty Acids (FISH OIL) 1000 MG CAPS Take 1 capsule (1,000 mg total) by mouth daily. 90 capsule 3   pantoprazole  (PROTONIX) 40 MG tablet Take 40 mg by mouth daily.     potassium chloride SA (KLOR-CON) 20 MEQ tablet Take 1 tablet (20 mEq total) by mouth daily. 90 tablet 3   spironolactone (ALDACTONE) 25 MG tablet Take 0.5 tablets (12.5 mg total) by mouth daily. 45 tablet 3   nitroGLYCERIN (NITROSTAT) 0.4 MG SL tablet Place 1 tablet (0.4 mg total) under the tongue as needed for chest pain (For chest pain). (Patient not taking: Reported on 02/23/2021) 30 tablet 3   No current facility-administered medications on file prior to visit.    Allergies:  Allergies  Allergen Reactions   Lisinopril Itching   Niaspan [Niacin Er] Itching   Simvastatin Itching   Past Medical History:  Past Medical History:  Diagnosis Date   Abnormal nuclear stress test 10/07/2016   Aftercare following surgery 02/22/2018   Cancer ([place])    Cardiomyopathy ([place]) 12/09/2014   Overview:  Ejection fraction 30% May 2018   Cataract    were removed   Cataract    were removed   CHF (congestive heart failure) ([facility])    CLL (chronic lymphocytic leukemia) ([facility])    Coronary artery disease    Coronary artery disease involving native coronary artery of native heart without angina pectoris 12/09/2014   Formatting of this note might be different from the original. LAD and Cx stents 2018 Formatting of this note might be different from the original. PTCA and drug-eluting stent to left anterior descending artery and circumflex artery in May 2018   Dyslipidemia 12/09/2014   Essential hypertension 12/09/2014   GERD (gastroesophageal reflux disease)    Hyperlipidemia    Hypertension    Ingrown nail 02/15/2018   Intermittent lightheadedness 10/13/2016   LV dysfunction 04/11/2017   Pulmonary infiltrates on CXR 01/26/2017   Onset symptoms was late June 2018 and CT scan 12/05/16 c/w as dz LLL/ clear on f/u 01/26/2017  -        PVC's (premature ventricular contractions) 10/07/2016   Rhinitis, chronic 01/27/2017   Allergy profile 01/26/2017 >  Eos 0.1 /  IgE  53  RAST pos dust only    Past Surgical History:  Past Surgical History:  Procedure Laterality Date   APPENDECTOMY     APPLICATION OF CRANIAL NAVIGATION Right 11/16/2020   Procedure: APPLICATION OF CRANIAL NAVIGATION;  Surgeon: Judith Part, MD;  Location: [facility];  Service: Neurosurgery;  Laterality: Right;   BASAL CELL CARCINOMA EXCISION  10/31/2019   cardiac stents     CRANIOTOMY Right 11/16/2020   Procedure: CRANIOTOMY FOR TUMOR EXCISION;  Surgeon: Judith Part, MD;  Location: [facility];  Service: Neurosurgery;  Laterality: Right;   EYE SURGERY     HERNIA REPAIR     Social History:  Social History   Socioeconomic History   Marital status: Married    Spouse name: Not on file   Number of children: Not on file   Years of education: Not on file   Highest education level: Not on file  Occupational History   Not on file  Tobacco Use   Smoking status: Never   Smokeless tobacco: Never   Tobacco comments:    States years ago/did not inhale/brief period/can't remeber dates  Vaping Use   Vaping Use: Never used  Substance and Sexual Activity   Alcohol use: No   Drug use: No   Sexual activity: Not Currently  Other Topics Concern   Not on file  Social History Narrative   Not on file   Social Determinants of Health   Financial Resource Strain: Not on file  Food Insecurity: Not on file  Transportation Needs: Not on file  Physical Activity: Not on file  Stress: Not on file  Social Connections: Not on file  Intimate Partner Violence: Not on file   Family History:  Family History  Problem Relation Age of Onset   Pneumonia Mother    Stomach cancer Father    Congestive Heart Failure Father    Heart attack Maternal Grandmother    Alzheimer's disease Maternal Grandfather    Cancer Paternal Grandmother    Heart Problems Sister     Review of Systems: Constitutional: Doesn't report fevers, chills or abnormal weight loss Eyes: Doesn't report blurriness of vision Ears,  nose, mouth, throat, and face: Doesn't report sore throat Respiratory: Doesn't report cough, dyspnea or wheezes Cardiovascular: Doesn't report palpitation, chest discomfort  Gastrointestinal:  Doesn't report nausea, constipation, diarrhea GU: Doesn't report incontinence Skin: Doesn't report skin rashes Neurological: Per HPI Musculoskeletal: Doesn't report joint pain Behavioral/Psych: Doesn't report anxiety  Physical Exam: Vitals:   02/23/21 0859  BP: 127/62  Pulse: 82  Resp: 17  Temp: 98 F (36.7 C)  SpO2: 100%   KPS: 80. General: Alert, cooperative, pleasant, in no acute distress Head: Normal EENT: No conjunctival injection or scleral icterus.  Lungs: Resp effort normal Cardiac: Regular rate Abdomen: Non-distended abdomen Skin: No rashes cyanosis or petechiae. Extremities: No clubbing or edema  Neurologic Exam: Mental Status: Awake, alert, attentive to examiner. Oriented to self and environment. Language is fluent with intact comprehension.  Cranial Nerves: Visual acuity is grossly normal. Visual fields are full. Extra-ocular movements intact. No ptosis. Face is symmetric Motor: Tone and bulk are normal. Power is full in both arms and legs. Reflexes are symmetric, no pathologic reflexes present.  Sensory: Intact to light touch Gait: Normal.   Labs: I have reviewed the data as listed    Component Value Date/Time   NA 142 01/28/2021 0854   NA 137 05/04/2020 0000   K 4.2 01/28/2021 0854   CL 105 01/28/2021 0854   CO2 30 01/28/2021 0854   GLUCOSE 84 01/28/2021 0854   BUN 11 01/28/2021 0854   BUN 16 05/04/2020 0000   CREATININE 1.18 01/28/2021 0854   CALCIUM 9.2 01/28/2021 0854   PROT 6.0 (L) 01/28/2021 0854   PROT 6.0 03/29/2018 1103   ALBUMIN 3.7 01/28/2021 0854   ALBUMIN 4.1 03/29/2018 1103   AST 20 01/28/2021 0854   ALT 15 01/28/2021 0854   ALKPHOS 65 01/28/2021 0854   BILITOT 0.8 01/28/2021 0854   GFRNONAA >60 01/28/2021 0854   GFRAA 63 05/04/2020 0000    Lab Results  Component Value Date   WBC 6.5 01/28/2021   NEUTROABS 2.9 01/28/2021   HGB 13.9 01/28/2021   HCT 42.0 01/28/2021   MCV 96.6 01/28/2021   PLT 171 01/28/2021   Imaging:  [facility] Clinician Interpretation: I have personally reviewed the CNS images as listed.  My interpretation, in the context of the patient's clinical presentation, is stable  disease  MR BRAIN W WO CONTRAST  Result Date: 02/20/2021 CLINICAL DATA:  Glioblastoma status post craniotomy for tumor resection EXAM: MRI HEAD WITHOUT AND WITH CONTRAST TECHNIQUE: Multiplanar, multiecho pulse sequences of the brain and surrounding structures were obtained without and with intravenous contrast. CONTRAST:  7.78m GADAVIST GADOBUTROL 1 MMOL/ML IV SOLN COMPARISON:  11/17/2020 FINDINGS: Brain: Resolution of postoperative blood products and swelling. Decreased T2 signal surrounding chronic blood products and focus of nodular enhancement the patient's mass resection. The area of nodular enhancement measures 9 x 6 mm on axial slices. Chronic small vessel ischemia and generalized brain atrophy. Vascular: Preserved flow voids and vessel enhancements Skull and upper cervical spine: Unremarkable craniotomy Sinuses/Orbits: Chronic sinusitis with prior endoscopic sinus surgery. IMPRESSION: Expected resolution of postoperative findings which may account for the decreased T2 signal and swelling around the operative site. There is a subcentimeter area of nodular enhancement at the mass which is more coalesced and defined than before. Electronically Signed   By: JJorje GuildM.D.   On: 02/20/2021 22:02    Assessment/Plan Glioblastoma multiforme of frontal lobe ([facility] [C71.1]  WRosendo Grosis clinically and radiographically stable today, now having completed IMRT and Temozolomide therapy.    We recommended initiating treatment with Temozolomide 1563mm2, on for five days and off for twenty three days in twenty eight day cycles. The patient  will have a complete blood count performed on days 21 and 28 of each cycle, and a comprehensive metabolic panel performed on day 28 of each cycle. Labs may need to be performed more often. Zofran will prescribed for home use for nausea/vomiting.   Informed consent was obtained verbally at bedside to proceed with oral chemotherapy.  Chemotherapy should be held for the following:  ANC less than 1,000  Platelets less than 100,000  LFT or creatinine greater than 2x ULN  If clinical concerns/contraindications develop  Should continue Keppra 50051mID, remain off dexamethasone.  We ask that WilRosendo Grosturn to clinic in 1 months with labs prior to cycle #2, or sooner as needed.  All questions were answered. The patient knows to call the clinic with any problems, questions or concerns. No barriers to learning were detected.  The total time spent in the encounter was 40 minutes and more than 50% was on counseling and review of test results   ZacVentura SellersD Medical Director of Neuro-Oncology [facility] [place]/27/22 1:03 PM

## 2021-02-23 NOTE — Telephone Encounter (Signed)
Oral Oncology Pharmacist Encounter  Received new prescription for Temodar (temozolomide) for the maintenance treatment of glioblastoma, planned duration likely 6-12 cycles.  Prescription dose and frequency assessed for appropriateness. Appropriate for therapy initiation.   CBC w/ Diff and CMP from 01/28/21 assessed, no relevant lab abnormalities noted.   Current medication list in Epic reviewed, no relevant/significant DDIs with Temodar identified.  Evaluated chart and no patient barriers to medication adherence noted.   Prescription has been e-scribed to the [facility] for benefits analysis and approval. Notified by pharmacy that current temozolomide strength of 140 mg capsules will need to be ordered. Expect medication to arrive at pharmacy on 02/24/21.  Oral Oncology Clinic will continue to follow for insurance authorization, copayment issues, initial counseling and start date.  Leron Croak, PharmD, BCPS Hematology/Oncology Clinical Pharmacist [facility] [phone] 02/23/2021 9:56 AM

## 2021-02-23 NOTE — Progress Notes (Signed)
DISCONTINUE ON PATHWAY REGIMEN - Neuro     One cycle, concurrent with RT:     Temozolomide   **Always confirm dose/schedule in your pharmacy ordering system**  REASON: Continuation Of Treatment PRIOR TREATMENT: [id number]: Radiation Therapy with Concurrent Temozolomide 75 mg/m2 Daily x 6 Weeks, Followed by Adjuvant Temozolomide TREATMENT RESPONSE: Stable Disease (SD)  START ON PATHWAY REGIMEN - Neuro     A cycle is every 28 days:     Temozolomide      Temozolomide   **Always confirm dose/schedule in your pharmacy ordering system**  Patient Characteristics: Glioblastoma (Grade 4 Glioma), Newly Diagnosed / Treatment Naive, Good Performance Status and/or Younger Patient, MGMT Promoter Unmethylated/Unknown Disease Classification: Glioma Disease Classification: Glioblastoma (Grade 4 Glioma) Disease Status: Newly Diagnosed / Treatment Naive Performance Status: Good Performance Status and/or Younger Patient MGMT Promoter Methylation Status: Unmethylated Intent of Therapy: Non-Curative / Palliative Intent, Discussed with Patient

## 2021-02-24 ENCOUNTER — Other Ambulatory Visit (HOSPITAL_COMMUNITY): Payer: Self-pay

## 2021-02-24 ENCOUNTER — Telehealth: Payer: Self-pay | Admitting: Internal Medicine

## 2021-02-24 NOTE — Telephone Encounter (Signed)
Scheduled appt per 9/27 los. Pt aware.

## 2021-02-24 NOTE — Telephone Encounter (Signed)
Oral Chemotherapy Pharmacist Encounter   Attempted to reach patient to provide update and offer for initial counseling on oral medication: Temodar (temozolomide).   No answer. Left voicemail for patient to call back to discuss details of medication acquisition and initial counseling session.  Leron Croak, PharmD, BCPS Hematology/Oncology Clinical Pharmacist [facility] [phone] 02/24/2021 1:50 PM

## 2021-02-25 NOTE — Telephone Encounter (Signed)
Oral Chemotherapy Pharmacist Encounter  I spoke with patient for overview of: Temodar (temozolomide) for the maintenance treatment of glioblastoma multiforme, planned duration 6-12 months of treatment.  Counseled on administration, dosing, side effects, monitoring, drug-food interactions, safe handling, storage, and disposal.  Patient will take two Temodar 140mg  capsules, 280mg  total daily dose, by mouth once daily, may take at bedtime and on an empty stomach to decrease nausea and vomiting.  If 1st cycle is well tolerated, Mr. Siedschlag informed that Temodar dose may be increased to 200 mg/m2 daily for 5 days on, 23 days off, repeated every 28 days for subsequent cycles   Patient will take Temodar daily for 5 days on, 23 days off, and repeat.  Temodar start date: 02/28/21   Patient will take Zofran 8mg  tablet, 1 tablet by mouth 30-60 min prior to Temodar dose to help decrease N/V.   Adverse effects include but are not limited to: nausea, vomiting, anorexia, GI upset, rash, drug fever, and fatigue. Rare but serious adverse effects of pneumocystis pneumonia and secondary malignancy also discussed.  We discussed strategies to manage constipation if they occur secondary to ondansetron dosing.  PCP prophylaxis will not be initiated at this time, but may be added based on lymphocyte count in the future.  Reviewed importance of keeping a medication schedule and plan for any missed doses. No barriers to medication adherence identified.  Medication reconciliation performed and medication/allergy list updated.  Insurance authorization for Temodar has been obtained. Test claim at the pharmacy revealed copayment $0 for 1st fill of Temodar. This will ship from the [facility] on 02/25/21 to deliver to patient's home on 02/26/21.  Patient informed the pharmacy will reach out 5-7 days prior to needing next fill of Temodar to coordinate continued medication acquisition to prevent break in  therapy.  All questions answered.  Mr. Nyman and his wife voiced understanding and appreciation.   Medication education handout placed in mail for patient. Patient knows to call the office with questions or concerns. Oral Chemotherapy Clinic phone number provided to patient.   Benn Moulder, PharmD Pharmacy Resident  02/25/2021 1:00 PM

## 2021-02-26 DIAGNOSIS — K219 Gastro-esophageal reflux disease without esophagitis: Secondary | ICD-10-CM | POA: Diagnosis not present

## 2021-02-26 DIAGNOSIS — E782 Mixed hyperlipidemia: Secondary | ICD-10-CM | POA: Diagnosis not present

## 2021-02-26 DIAGNOSIS — I10 Essential (primary) hypertension: Secondary | ICD-10-CM | POA: Diagnosis not present

## 2021-02-28 NOTE — Progress Notes (Signed)
  Radiation Oncology         [phone]) [phone] ________________________________  Name: Nathan Hall MRN: [id number]  Date of Service: 03/01/2021  DOB: 1934-08-30  Post Treatment Telephone Note  Diagnosis:   Glioblastoma of the right frontal lobe  Interval Since Last Radiation:  5 weeks   12/21/2020 through 01/29/2021 Site Technique Total Dose (Gy) Dose per Fx (Gy) Completed Fx Beam Energies  Brain: Brain IMRT 46/46 2 23/23 6X  Brain: Brain_Bst IMRT 14/14 2 7/7 6X   Narrative:  The patient was contacted today for routine follow-up. During treatment he did very well with radiotherapy and did not have significant desquamation.   Impression/Plan: 1. Glioblastoma of the right frontal lobe. I was unable to reach the patient but left a voicemail and on the message, I  discussed that we would be happy to continue to follow him as needed, but he will also continue to follow up with Dr. Mickeal Skinner in neuro oncology.     Carola Rhine, PAC

## 2021-03-01 ENCOUNTER — Ambulatory Visit
Admission: RE | Admit: 2021-03-01 | Discharge: 2021-03-01 | Disposition: A | Discharge status: Home / Self Care | Payer: PPO | Source: Ambulatory Visit | Attending: Radiation Oncology | Admitting: Radiation Oncology

## 2021-03-01 DIAGNOSIS — C711 Malignant neoplasm of frontal lobe: Secondary | ICD-10-CM

## 2021-03-02 ENCOUNTER — Telehealth: Payer: Self-pay

## 2021-03-02 ENCOUNTER — Telehealth: Payer: Self-pay | Admitting: Radiation Oncology

## 2021-03-02 NOTE — Telephone Encounter (Signed)
Pt's wife left message that pt had started Temodar. The first dose/day pt only took 1 tablet, but realized next day he was supposed to take 2 of the 140 mg tablets daily for 5 days. Per Dr Mickeal Skinner, pt to take 2 tablets daily for days 2 through 5 and then on day 6 take the left over tablet. Pt's wife acknowledged information and verbalized understanding.

## 2021-03-02 NOTE — Telephone Encounter (Signed)
I called the patient and his wife and they missed a dose of temodar but started both pills yesterday. I returned their call and encouraged them to keep Dr. Mickeal Skinner involved in concerns about dosing or administration.

## 2021-03-04 NOTE — Progress Notes (Signed)
[facility]  [address] [place],  [place]  [address] [phone]  Clinic Day:  03/10/2021  Referring physician: Venetia Maxon, Sharon Mt, *  This document serves as a record of services personally performed by Hosie Poisson, MD. It was created on their behalf by Curry,Lauren E, a trained medical scribe. The creation of this record is based on the scribe's personal observations and the provider's statements to them.  CHIEF COMPLAINT:  CC: History of chronic lymphocytic leukemia  Current Treatment:  Surveillance   HISTORY OF PRESENT ILLNESS:  Nathan Hall is a 86 y.o. male with a history of chronic lymphocytic leukemia since 2010.  He was referred to [place] in July 2020 for worsening leukocytosis.  He had not been checked by a hematologist for 2 years.  He had lost weight rapidly when he started taking mexiletine, but slowly regained.  In September 2008, he had a normal CBC with a white count of 7.7.  In February 2017, his white count was up to 16.7, then to 13.6 by May 2018.  In early July 2020 and his white count was up to 15,000 with 45% neutrophils and 47% lymphocytes.  His white blood count was down to 13,700 with 64.6% lymphocytes at the time of consultation.  His white blood count went up to 20,400 with 61% lymphocytes in April.  Patient presented to medical attention on June 17th 2022 with first ever seizure.  He described involuntary nocturnal twitching of the left side of his face accompanied by slurred speech.  This resolved prior to arriving at the ED later the next morning, more or less at his baseline.  CNS imaging demonstrated enhancing mass in the right temporal lobe consistent with tumor. This was resected by Dr. Zada Finders on June 20th 2022; pathology confirmed glioblastoma. He was then treated with radiation and oral chemotherapy starting on July 25th in the form of daily temozolomide for 6 weeks. He is now on a maintenance of  temozolomide 140 mg to take 2 pills daily for 5 days to repeat monthly. Most recent MRI brain from September revealed expected resolution of postoperative findings which may account for the decreased T2 signal and swelling around the operative site. There is a subcentimeter area of nodular enhancement at the mass which is more coalesced and defined than before.   INTERVAL HISTORY:  Nathan Hall is here for annual follow up and states that he just completed his 5 days of monthly temozolomide a week ago. He is doing fair and denies complaints today. Blood counts, differential and chemistries are unremarkable. His  appetite is good, and he has lost nearly 15 pounds since his last visit.  He denies fever, chills or other signs of infection.  He denies nausea, vomiting, bowel issues, or abdominal pain.  He denies sore throat, cough, dyspnea, or chest pain.  REVIEW OF SYSTEMS:  Review of Systems  Constitutional: Negative.  Negative for appetite change, chills, fatigue, fever and unexpected weight change.  HENT:  Negative.    Eyes: Negative.   Respiratory: Negative.  Negative for chest tightness, cough, hemoptysis, shortness of breath and wheezing.   Cardiovascular: Negative.  Negative for chest pain, leg swelling and palpitations.  Gastrointestinal: Negative.  Negative for abdominal distention, abdominal pain, blood in stool, constipation, diarrhea, nausea and vomiting.  Endocrine: Negative.   Genitourinary: Negative.  Negative for difficulty urinating, dysuria, frequency and hematuria.   Musculoskeletal: Negative.  Negative for arthralgias, back pain, flank pain, gait problem and myalgias.  Skin: Negative.   Neurological: Negative.  Negative for dizziness, extremity weakness, gait problem, headaches, light-headedness, numbness, seizures and speech difficulty.  Hematological: Negative.   Psychiatric/Behavioral: Negative.  Negative for depression and sleep disturbance. The patient is not nervous/anxious.    All other systems reviewed and are negative.   VITALS:  Blood pressure (!) 164/76, pulse 72, temperature 98.7 F (37.1 C), temperature source Oral, resp. rate 18, height 5\' 6"  (1.676 m), weight 157 lb 12.8 oz (71.6 kg), SpO2 96 %.  Wt Readings from Last 3 Encounters:  03/10/21 157 lb 12.8 oz (71.6 kg)  02/23/21 161 lb 3.2 oz (73.1 kg)  01/28/21 161 lb 9.6 oz (73.3 kg)    Body mass index is 25.47 kg/m.  Performance status (ECOG): 0 - Asymptomatic  PHYSICAL EXAM:  Physical Exam Constitutional:      General: He is not in acute distress.    Appearance: Normal appearance. He is normal weight.  HENT:     Head: Normocephalic and atraumatic.     Comments: He has some slight alopecia of the right temple area with a faint scar which is well healed. Eyes:     General: No scleral icterus.    Extraocular Movements: Extraocular movements intact.     Conjunctiva/sclera: Conjunctivae normal.     Pupils: Pupils are equal, round, and reactive to light.  Cardiovascular:     Rate and Rhythm: Normal rate and regular rhythm.     Pulses: Normal pulses.     Heart sounds: Normal heart sounds. No murmur heard.   No friction rub. No gallop.  Pulmonary:     Effort: Pulmonary effort is normal. No respiratory distress.     Breath sounds: Normal breath sounds.  Abdominal:     General: Bowel sounds are normal. There is no distension.     Palpations: Abdomen is soft. There is no hepatomegaly, splenomegaly or mass.     Tenderness: There is no abdominal tenderness.  Musculoskeletal:        General: Normal range of motion.     Cervical back: Normal range of motion and neck supple.     Right lower leg: No edema.     Left lower leg: No edema.  Lymphadenopathy:     Cervical: No cervical adenopathy.  Skin:    General: Skin is warm and dry.  Neurological:     General: No focal deficit present.     Mental Status: He is alert and oriented to person, place, and time. Mental status is at baseline.   Psychiatric:        Mood and Affect: Mood normal.        Behavior: Behavior normal.        Thought Content: Thought content normal.        Judgment: Judgment normal.    LABS:   CBC Latest Ref Rng & Units 03/10/2021 01/28/2021 01/12/2021  WBC - 7.9 6.5 8.8  Hemoglobin 13.5 - 17.5 14.1 13.9 13.9  Hematocrit 41 - 53 43 42.0 42.6  Platelets 150 - 399 165 171 200   CMP Latest Ref Rng & Units 03/10/2021 01/28/2021 01/12/2021  Glucose 70 - 99 mg/dL - 84 87  BUN 4 - 21 13 11 11   Creatinine 0.6 - 1.3 0.9 1.18 1.24  Sodium 137 - 147 139 142 141  Potassium 3.4 - 5.3 4.2 4.2 4.5  Chloride 99 - 108 107 105 105  CO2 13 - 22 28(A) 30 30  Calcium 8.7 - 10.7 9.2 9.2  9.2  Total Protein 6.5 - 8.1 g/dL - 6.0(L) 5.9(L)  Total Bilirubin 0.3 - 1.2 mg/dL - 0.8 0.6  Alkaline Phos 25 - 125 64 65 65  AST 14 - 40 35 20 25  ALT 10 - 40 18 15 20     STUDIES:  MR BRAIN W WO CONTRAST  Result Date: 02/20/2021 CLINICAL DATA:  Glioblastoma status post craniotomy for tumor resection EXAM: MRI HEAD WITHOUT AND WITH CONTRAST TECHNIQUE: Multiplanar, multiecho pulse sequences of the brain and surrounding structures were obtained without and with intravenous contrast. CONTRAST:  7.51mL GADAVIST GADOBUTROL 1 MMOL/ML IV SOLN COMPARISON:  11/17/2020 FINDINGS: Brain: Resolution of postoperative blood products and swelling. Decreased T2 signal surrounding chronic blood products and focus of nodular enhancement the patient's mass resection. The area of nodular enhancement measures 9 x 6 mm on axial slices. Chronic small vessel ischemia and generalized brain atrophy. Vascular: Preserved flow voids and vessel enhancements Skull and upper cervical spine: Unremarkable craniotomy Sinuses/Orbits: Chronic sinusitis with prior endoscopic sinus surgery. IMPRESSION: Expected resolution of postoperative findings which may account for the decreased T2 signal and swelling around the operative site. There is a subcentimeter area of nodular  enhancement at the mass which is more coalesced and defined than before. Electronically Signed   By: Jorje Guild M.D.   On: 02/20/2021 22:02      HISTORY:   Allergies:  Allergies  Allergen Reactions   Lisinopril Itching   Niaspan [Niacin Er] Itching   Simvastatin Itching    Current Medications: Current Outpatient Medications  Medication Sig Dispense Refill   aspirin EC 81 MG tablet Take 1 tablet (81 mg total) by mouth daily. Restart on 11/23/20 90 tablet 3   atorvastatin (LIPITOR) 20 MG tablet Take 1 tablet (20 mg total) by mouth daily. 90 tablet 3   busPIRone (BUSPAR) 10 MG tablet Take 10 mg by mouth 2 (two) times daily as needed (anxiety attacks).     carvedilol (COREG) 6.25 MG tablet Take 1 tablet (6.25 mg total) by mouth 2 (two) times daily with a meal. 180 tablet 3   Cholecalciferol (VITAMIN D3) 2000 units capsule Take 2,000 Units by mouth daily.     CO ENZYME Q-10 PO Take 200 mg by mouth daily.     fluticasone (FLONASE) 50 MCG/ACT nasal spray Place 2 sprays into both nostrils daily.     furosemide (LASIX) 40 MG tablet Take 1 tablet (40 mg total) by mouth daily as needed for edema. 90 tablet 3   levETIRAcetam (KEPPRA) 500 MG tablet Take 1 tablet (500 mg total) by mouth 2 (two) times daily. 60 tablet 5   mexiletine (MEXITIL) 150 MG capsule TAKE 1 CAPSULE BY MOUTH TWICE A DAY 180 capsule 2   multivitamin-lutein (OCUVITE-LUTEIN) CAPS capsule Take 1 capsule by mouth daily.     nitroGLYCERIN (NITROSTAT) 0.4 MG SL tablet Place 1 tablet (0.4 mg total) under the tongue as needed for chest pain (For chest pain). (Patient not taking: Reported on 02/23/2021) 30 tablet 3   Omega-3 Fatty Acids (FISH OIL) 1000 MG CAPS Take 1 capsule (1,000 mg total) by mouth daily. 90 capsule 3   ondansetron (ZOFRAN) 8 MG tablet Take 1 tablet by mouth 2 times daily as needed (nausea and vomiting). May take 30 - 60 minutes prior to Temodar administration if nausea/vomiting occurs. 30 tablet 1   pantoprazole  (PROTONIX) 40 MG tablet Take 40 mg by mouth daily.     potassium chloride SA (KLOR-CON) 20 MEQ tablet Take  1 tablet (20 mEq total) by mouth daily. 90 tablet 3   spironolactone (ALDACTONE) 25 MG tablet Take 0.5 tablets (12.5 mg total) by mouth daily. 45 tablet 3   temozolomide (TEMODAR) 140 MG capsule Take 2 capsules (280 mg total) by mouth daily. May take on an empty stomach to decrease nausea & vomiting. 10 capsule 0   No current facility-administered medications for this visit.     ASSESSMENT & PLAN:   Assessment:   Long standing history of chronic lymphocytic leukemia. The white blood count is normal with a normal differential.    Glioblastoma, diagnosed June 2022. This was resected by Dr. Zada Finders on June 20th. He was then treated with radiation and oral chemotherapy starting on July 25th in the form of daily temozolomide for 6 weeks. He is now on a maintenance of temozolomide 140 mg to take 2 pills daily for 5 days to repeat monthly. He continues to follow with Dr. Mickeal Skinner, and plans routine MRI's every 2-3 months.  Plan: He will see Dr. Mickeal Skinner again on October 30th. His CLL remains undetectable and so we will plan to see him back in 1 year with a CBC and CMP. The patient and his wife understand the plans discussed today and are in agreement with them. They know to contact our office if they develop concerns regarding his CLL.   I provided 15 minutes of face-to-face time during this this encounter and > 50% was spent counseling as documented under my assessment and plan.    Derwood Kaplan, MD [facility] AT [facility] [address] [place] [address] Dept: [phone] Dept Fax: [phone]   I, Rita Ohara, am acting as scribe for Derwood Kaplan, MD  I have reviewed this report as typed by the medical scribe, and it is complete and accurate.  Hermina [place]

## 2021-03-10 ENCOUNTER — Inpatient Hospital Stay: Payer: PPO

## 2021-03-10 ENCOUNTER — Telehealth: Payer: Self-pay | Admitting: Oncology

## 2021-03-10 ENCOUNTER — Inpatient Hospital Stay: Payer: PPO | Attending: Internal Medicine | Admitting: Oncology

## 2021-03-10 ENCOUNTER — Other Ambulatory Visit: Payer: Self-pay | Admitting: Oncology

## 2021-03-10 ENCOUNTER — Encounter: Payer: Self-pay | Admitting: Oncology

## 2021-03-10 ENCOUNTER — Other Ambulatory Visit: Payer: Self-pay | Admitting: Hematology and Oncology

## 2021-03-10 VITALS — BP 164/76 | HR 72 | Temp 98.7°F | Resp 18 | Ht 66.0 in | Wt 157.8 lb

## 2021-03-10 DIAGNOSIS — C9110 Chronic lymphocytic leukemia of B-cell type not having achieved remission: Secondary | ICD-10-CM

## 2021-03-10 DIAGNOSIS — C711 Malignant neoplasm of frontal lobe: Secondary | ICD-10-CM

## 2021-03-10 DIAGNOSIS — D649 Anemia, unspecified: Secondary | ICD-10-CM | POA: Diagnosis not present

## 2021-03-10 LAB — HEPATIC FUNCTION PANEL
ALT: 18 (ref 10–40)
AST: 35 (ref 14–40)
Alkaline Phosphatase: 64 (ref 25–125)
Bilirubin, Total: 0.9

## 2021-03-10 LAB — CBC AND DIFFERENTIAL
HCT: 43 (ref 41–53)
Hemoglobin: 14.1 (ref 13.5–17.5)
Neutrophils Absolute: 4.03
Platelets: 165 (ref 150–399)
WBC: 7.9

## 2021-03-10 LAB — BASIC METABOLIC PANEL
BUN: 13 (ref 4–21)
CO2: 28 — AB (ref 13–22)
Chloride: 107 (ref 99–108)
Creatinine: 0.9 (ref 0.6–1.3)
Glucose: 107
Potassium: 4.2 (ref 3.4–5.3)
Sodium: 139 (ref 137–147)

## 2021-03-10 LAB — COMPREHENSIVE METABOLIC PANEL
Albumin: 3.9 (ref 3.5–5.0)
Calcium: 9.2 (ref 8.7–10.7)

## 2021-03-10 LAB — CBC: RBC: 4.43 (ref 3.87–5.11)

## 2021-03-10 NOTE — Telephone Encounter (Signed)
Per 10/12 los next appt scheduled and given to patient 

## 2021-03-15 ENCOUNTER — Encounter: Payer: Self-pay | Admitting: Internal Medicine

## 2021-03-17 ENCOUNTER — Other Ambulatory Visit: Payer: Self-pay | Admitting: Cardiology

## 2021-03-18 ENCOUNTER — Other Ambulatory Visit (HOSPITAL_COMMUNITY): Payer: Self-pay

## 2021-03-29 ENCOUNTER — Inpatient Hospital Stay: Payer: PPO | Admitting: Internal Medicine

## 2021-03-29 ENCOUNTER — Inpatient Hospital Stay: Payer: PPO

## 2021-03-29 ENCOUNTER — Other Ambulatory Visit: Payer: Self-pay

## 2021-03-29 ENCOUNTER — Telehealth: Payer: Self-pay | Admitting: Internal Medicine

## 2021-03-29 VITALS — BP 120/64 | HR 72 | Temp 97.6°F | Resp 18 | Wt 158.1 lb

## 2021-03-29 DIAGNOSIS — Z923 Personal history of irradiation: Secondary | ICD-10-CM | POA: Insufficient documentation

## 2021-03-29 DIAGNOSIS — C711 Malignant neoplasm of frontal lobe: Secondary | ICD-10-CM

## 2021-03-29 DIAGNOSIS — Z79899 Other long term (current) drug therapy: Secondary | ICD-10-CM | POA: Diagnosis not present

## 2021-03-29 DIAGNOSIS — C9111 Chronic lymphocytic leukemia of B-cell type in remission: Secondary | ICD-10-CM | POA: Diagnosis not present

## 2021-03-29 DIAGNOSIS — C712 Malignant neoplasm of temporal lobe: Secondary | ICD-10-CM | POA: Diagnosis not present

## 2021-03-29 LAB — CMP (CANCER CENTER ONLY)
ALT: 13 U/L (ref 0–44)
AST: 17 U/L (ref 15–41)
Albumin: 3.6 g/dL (ref 3.5–5.0)
Alkaline Phosphatase: 68 U/L (ref 38–126)
Anion gap: 5 (ref 5–15)
BUN: 14 mg/dL (ref 8–23)
CO2: 30 mmol/L (ref 22–32)
Calcium: 9.1 mg/dL (ref 8.9–10.3)
Chloride: 105 mmol/L (ref 98–111)
Creatinine: 0.98 mg/dL (ref 0.61–1.24)
GFR, Estimated: >60 mL/min (ref >60)
Glucose, Bld: 95 mg/dL (ref 70–99)
Potassium: 4.7 mmol/L (ref 3.5–5.1)
Sodium: 140 mmol/L (ref 135–145)
Total Bilirubin: 0.9 mg/dL (ref 0.3–1.2)
Total Protein: 6 g/dL — ABNORMAL LOW (ref 6.5–8.1)

## 2021-03-29 LAB — CBC WITH DIFFERENTIAL (CANCER CENTER ONLY)
Abs Immature Granulocytes: 0.01 10*3/uL (ref 0.00–0.07)
Basophils Absolute: 0 10*3/uL (ref 0.0–0.1)
Basophils Relative: 1 %
Eosinophils Absolute: 0.1 10*3/uL (ref 0.0–0.5)
Eosinophils Relative: 1 %
HCT: 39.8 % (ref 39.0–52.0)
Hemoglobin: 13.4 g/dL (ref 13.0–17.0)
Immature Granulocytes: 0 %
Lymphocytes Relative: 37 %
Lymphs Abs: 2.4 10*3/uL (ref 0.7–4.0)
MCH: 32.4 pg (ref 26.0–34.0)
MCHC: 33.7 g/dL (ref 30.0–36.0)
MCV: 96.1 fL (ref 80.0–100.0)
Monocytes Absolute: 0.7 10*3/uL (ref 0.1–1.0)
Monocytes Relative: 10 %
Neutro Abs: 3.3 10*3/uL (ref 1.7–7.7)
Neutrophils Relative %: 51 %
Platelet Count: 187 10*3/uL (ref 150–400)
RBC: 4.14 MIL/uL — ABNORMAL LOW (ref 4.22–5.81)
RDW: 12.1 % (ref 11.5–15.5)
WBC Count: 6.5 10*3/uL (ref 4.0–10.5)
nRBC: 0 % (ref 0.0–0.2)

## 2021-03-29 NOTE — Progress Notes (Signed)
[facility] [place], [place] [address] [phone]   Interval Evaluation  Date of Service: 03/29/21 Patient Name: Nathan Hall Patient MRN: [id number] Patient DOB: 03/16/1935 Provider: Ventura Sellers, MD  Identifying Statement:  Nathan Hall is a 86 y.o. male with right temporal glioblastoma   Oncologic History: Oncology History  CLL (chronic lymphocytic leukemia) ([place])  05/30/2008 Cancer Staging   Staging form: Chronic Lymphocytic Leukemia / Small Lymphocytic Lymphoma, AJCC 8th Edition - Clinical stage from 05/30/2008: Modified Rai Stage 0 (Modified Rai risk: Low, Binet: Stage A, Lymphocytosis: Present, Adenopathy: Absent, Organomegaly: Absent, Anemia: Absent, Thrombocytopenia: Absent) - Signed by Derwood Kaplan, MD on 03/15/2021 Histopathologic type: B-cell lymphocytic leukemia/small lymphocytic lymphoma (see also M-9670/3) Stage prefix: Initial diagnosis Stage used in treatment planning: Yes National guidelines used in treatment planning: Yes Type of national guideline used in treatment planning: NCCN    05/01/2020 Initial Diagnosis   CLL (chronic lymphocytic leukemia) ([place])   Glioblastoma multiforme of frontal lobe ([place])  11/16/2020 Surgery   Craniotomy, resection of R temporal mass by Dr. Jearl Klinefelter; path demonstrates glioblastoma   11/16/2020 Cancer Staging   Staging form: Brain and Spinal Cord, AJCC 8th Edition - Pathologic stage from 11/16/2020: WHO Grade IV - Signed by Ventura Sellers, MD on 12/10/2020 Stage prefix: Initial diagnosis Histologic grading system: 4 grade system Extent of surgical resection: Complete (gross) resection Solitary (s) or multifocal (m) tumors in the primary site: Solitary Tumor location in brain: Noneloquent brain area Karnofsky performance status: Score 80 Seizures at presentation: Present Duration of symptoms before diagnosis: Short IDH1 mutation: Negative IDH2  mutation: Unknown 1p loss of heterozygosity (LOH): Unknown 19q loss of heterozygosity (LOH): Unknown MGMT methylation: Absent    12/21/2020 - 12/21/2020 Chemotherapy   Patient is on Treatment Plan : BRAIN GLIOBLASTOMA Radiation Therapy With Concurrent Temozolomide 75 mg/m2 Daily Followed By Sequential Maintenance Temozolomide x 6-12 cycles     02/28/2021 -  Chemotherapy   Patient is on Treatment Plan : BRAIN GLIOBLASTOMA Consolidation Temozolomide Days 1-5 q28 Days        Biomarkers:  MGMT Unmethylated.  IDH 1/2 Unknown.  EGFR Unknown  TERT Unknown   Interval History: Nathan Hall presents today for follow up, now having completed first cycle of Temodar.  He describes general decline in function and quality of life with the chemo this month.  He became very tired, less interactive and engaged.  There was some nausea, but this improved by the end of the month. Continues to get out on his riding mower without disability.  No further seizures.  H+P (12/01/20) Patient presented to medical attention on 11/13/20 with first ever seizure.  He describes involuntary nocturnal twitching of the left side of his face accompanied by slurred speech.  This resolved prior to arriving at the ED later the next morning, more or less at his baseline.  CNS imaging demonstrated enhancing mass in the right temporal lobe consistent with tumor, this was resected by Dr. Zada Finders on 11/16/20; path was glioblastoma.  Today he feels more or less at baseline, but complains of excess fatigue compared to prior to surgery.  He likes to cut grass on his riding mower.  Medications: Current Outpatient Medications on File Prior to Visit  Medication Sig Dispense Refill   aspirin EC 81 MG tablet Take 1 tablet (81 mg total) by mouth daily. Restart on 11/23/20 90 tablet 3   atorvastatin (LIPITOR) 20 MG  tablet Take 1 tablet (20 mg total) by mouth daily. 90 tablet 3   busPIRone (BUSPAR) 10 MG tablet Take 10 mg by mouth 2 (two)  times daily as needed (anxiety attacks).     carvedilol (COREG) 6.25 MG tablet Take 1 tablet (6.25 mg total) by mouth 2 (two) times daily with a meal. 180 tablet 3   Cholecalciferol (VITAMIN D3) 2000 units capsule Take 2,000 Units by mouth daily.     CO ENZYME Q-10 PO Take 200 mg by mouth daily.     fluticasone (FLONASE) 50 MCG/ACT nasal spray Place 2 sprays into both nostrils daily.     furosemide (LASIX) 40 MG tablet Take 1 tablet (40 mg total) by mouth daily as needed for edema. 90 tablet 3   levETIRAcetam (KEPPRA) 500 MG tablet Take 1 tablet (500 mg total) by mouth 2 (two) times daily. 60 tablet 5   mexiletine (MEXITIL) 150 MG capsule TAKE 1 CAPSULE BY MOUTH TWICE A DAY 180 capsule 2   multivitamin-lutein (OCUVITE-LUTEIN) CAPS capsule Take 1 capsule by mouth daily.     nitroGLYCERIN (NITROSTAT) 0.4 MG SL tablet Place 1 tablet (0.4 mg total) under the tongue as needed for chest pain (For chest pain). (Patient not taking: Reported on 02/23/2021) 30 tablet 3   Omega-3 Fatty Acids (FISH OIL) 1000 MG CAPS Take 1 capsule (1,000 mg total) by mouth daily. 90 capsule 3   ondansetron (ZOFRAN) 8 MG tablet Take 1 tablet by mouth 2 times daily as needed (nausea and vomiting). May take 30 - 60 minutes prior to Temodar administration if nausea/vomiting occurs. 30 tablet 1   pantoprazole (PROTONIX) 40 MG tablet Take 40 mg by mouth daily.     potassium chloride SA (KLOR-CON) 20 MEQ tablet Take 1 tablet (20 mEq total) by mouth daily. 90 tablet 3   spironolactone (ALDACTONE) 25 MG tablet Take 0.5 tablets (12.5 mg total) by mouth daily. 45 tablet 3   temozolomide (TEMODAR) 140 MG capsule Take 2 capsules (280 mg total) by mouth daily. May take on an empty stomach to decrease nausea & vomiting. 10 capsule 0   No current facility-administered medications on file prior to visit.    Allergies:  Allergies  Allergen Reactions   Lisinopril Itching   Niaspan [Niacin Er] Itching   Simvastatin Itching   Past Medical  History:  Past Medical History:  Diagnosis Date   Abnormal nuclear stress test 10/07/2016   Aftercare following surgery 02/22/2018   Cancer ([place])    Cardiomyopathy ([place]) 12/09/2014   Overview:  Ejection fraction 30% May 2018   Cataract    were removed   Cataract    were removed   CHF (congestive heart failure) ([facility])    CLL (chronic lymphocytic leukemia) ([facility])    Coronary artery disease    Coronary artery disease involving native coronary artery of native heart without angina pectoris 12/09/2014   Formatting of this note might be different from the original. LAD and Cx stents 2018 Formatting of this note might be different from the original. PTCA and drug-eluting stent to left anterior descending artery and circumflex artery in May 2018   Dyslipidemia 12/09/2014   Essential hypertension 12/09/2014   GERD (gastroesophageal reflux disease)    Hyperlipidemia    Hypertension    Ingrown nail 02/15/2018   Intermittent lightheadedness 10/13/2016   LV dysfunction 04/11/2017   Pulmonary infiltrates on CXR 01/26/2017   Onset symptoms was late June 2018 and CT scan 12/05/16 c/w as dz LLL/ clear  on f/u 01/26/2017  -        PVC's (premature ventricular contractions) 10/07/2016   Rhinitis, chronic 01/27/2017   Allergy profile 01/26/2017 >  Eos 0.1 /  IgE  53 RAST pos dust only    Past Surgical History:  Past Surgical History:  Procedure Laterality Date   APPENDECTOMY     APPLICATION OF CRANIAL NAVIGATION Right 11/16/2020   Procedure: APPLICATION OF CRANIAL NAVIGATION;  Surgeon: Judith Part, MD;  Location: [facility];  Service: Neurosurgery;  Laterality: Right;   BASAL CELL CARCINOMA EXCISION  10/31/2019   cardiac stents     CRANIOTOMY Right 11/16/2020   Procedure: CRANIOTOMY FOR TUMOR EXCISION;  Surgeon: Judith Part, MD;  Location: [facility];  Service: Neurosurgery;  Laterality: Right;   EYE SURGERY     HERNIA REPAIR     Social History:  Social History   Socioeconomic History   Marital status:  Married    Spouse name: Not on file   Number of children: Not on file   Years of education: Not on file   Highest education level: Not on file  Occupational History   Not on file  Tobacco Use   Smoking status: Never   Smokeless tobacco: Never   Tobacco comments:    States years ago/did not inhale/brief period/can't remeber dates  Vaping Use   Vaping Use: Never used  Substance and Sexual Activity   Alcohol use: No   Drug use: No   Sexual activity: Not Currently  Other Topics Concern   Not on file  Social History Narrative   Not on file   Social Determinants of Health   Financial Resource Strain: Not on file  Food Insecurity: Not on file  Transportation Needs: Not on file  Physical Activity: Not on file  Stress: Not on file  Social Connections: Not on file  Intimate Partner Violence: Not on file   Family History:  Family History  Problem Relation Age of Onset   Pneumonia Mother    Stomach cancer Father    Congestive Heart Failure Father    Heart attack Maternal Grandmother    Alzheimer's disease Maternal Grandfather    Cancer Paternal Grandmother    Heart Problems Sister     Review of Systems: Constitutional: Doesn't report fevers, chills or abnormal weight loss Eyes: Doesn't report blurriness of vision Ears, nose, mouth, throat, and face: Doesn't report sore throat Respiratory: Doesn't report cough, dyspnea or wheezes Cardiovascular: Doesn't report palpitation, chest discomfort  Gastrointestinal:  Doesn't report nausea, constipation, diarrhea GU: Doesn't report incontinence Skin: Doesn't report skin rashes Neurological: Per HPI Musculoskeletal: Doesn't report joint pain Behavioral/Psych: Doesn't report anxiety  Physical Exam: Vitals:   03/29/21 0917  BP: 120/64  Pulse: 72  Resp: 18  Temp: 97.6 F (36.4 C)  SpO2: 99%   KPS: 80. General: Alert, cooperative, pleasant, in no acute distress Head: Normal EENT: No conjunctival injection or scleral  icterus.  Lungs: Resp effort normal Cardiac: Regular rate Abdomen: Non-distended abdomen Skin: No rashes cyanosis or petechiae. Extremities: No clubbing or edema  Neurologic Exam: Mental Status: Awake, alert, attentive to examiner. Oriented to self and environment. Language is fluent with intact comprehension.  Cranial Nerves: Visual acuity is grossly normal. Visual fields are full. Extra-ocular movements intact. No ptosis. Face is symmetric Motor: Tone and bulk are normal. Power is full in both arms and legs. Reflexes are symmetric, no pathologic reflexes present.  Sensory: Intact to light touch Gait: Normal.   Labs:  I have reviewed the data as listed    Component Value Date/Time   NA 139 03/10/2021 0000   K 4.2 03/10/2021 0000   CL 107 03/10/2021 0000   CO2 28 (A) 03/10/2021 0000   GLUCOSE 84 01/28/2021 0854   BUN 13 03/10/2021 0000   CREATININE 0.9 03/10/2021 0000   CREATININE 1.18 01/28/2021 0854   CALCIUM 9.2 03/10/2021 0000   PROT 6.0 (L) 01/28/2021 0854   PROT 6.0 03/29/2018 1103   ALBUMIN 3.9 03/10/2021 0000   ALBUMIN 4.1 03/29/2018 1103   AST 35 03/10/2021 0000   AST 20 01/28/2021 0854   ALT 18 03/10/2021 0000   ALT 15 01/28/2021 0854   ALKPHOS 64 03/10/2021 0000   BILITOT 0.8 01/28/2021 0854   GFRNONAA >60 01/28/2021 0854   GFRAA 63 05/04/2020 0000   Lab Results  Component Value Date   WBC 6.5 03/29/2021   NEUTROABS 3.3 03/29/2021   HGB 13.4 03/29/2021   HCT 39.8 03/29/2021   MCV 96.1 03/29/2021   PLT 187 03/29/2021     Assessment/Plan Glioblastoma multiforme of frontal lobe ([place]) [C71.1]  Rosendo Gros is clinically and radiographically stable today, now having completed cycle #1 of adjuvant Temodar.  Because of poor tolerance and quality of life issues, we advised holding the next cycle of chemo.  We will repeat a brain MRI in 1 month; if progressive changes are noted we can consider transition to metronomic Temodar.  He is agreeable with  this plan.  Should continue Keppra 526m BID, remain off dexamethasone as prior.  We ask that WRosendo Grosreturn to clinic in 1 months with MRI for evluation.  All questions were answered. The patient knows to call the clinic with any problems, questions or concerns. No barriers to learning were detected.  The total time spent in the encounter was 30 minutes and more than 50% was on counseling and review of test results   ZVentura Sellers MD Medical Director of Neuro-Oncology [facility]10/31/22 9:29 AM

## 2021-03-29 NOTE — Telephone Encounter (Signed)
Scheduled per 10/31 los, message was left with pt

## 2021-03-30 ENCOUNTER — Other Ambulatory Visit (HOSPITAL_COMMUNITY): Payer: Self-pay

## 2021-04-01 ENCOUNTER — Other Ambulatory Visit (HOSPITAL_COMMUNITY): Payer: Self-pay

## 2021-04-08 ENCOUNTER — Encounter: Payer: Self-pay | Admitting: Internal Medicine

## 2021-04-08 DIAGNOSIS — Z23 Encounter for immunization: Secondary | ICD-10-CM | POA: Diagnosis not present

## 2021-04-13 ENCOUNTER — Ambulatory Visit: Payer: PPO | Admitting: Cardiology

## 2021-04-13 ENCOUNTER — Other Ambulatory Visit (HOSPITAL_COMMUNITY): Payer: Self-pay

## 2021-04-13 NOTE — Progress Notes (Deleted)
Electrophysiology Office Note   Date:  04/13/2021   ID:  Nathan, Hall [birth year], MRN [id number]  PCP:  Street, Nathan Mt, MD  Cardiologist:  Agustin Cree Primary Electrophysiologist:  Rheagan Nayak Meredith Leeds, MD    No chief complaint on file.    History of Present Illness: Nathan Hall is a 86 y.o. male who is being seen today for the evaluation of PVCs at the request of [name]. Presenting today for electrophysiology evaluation.    He has a history significant chronic systolic heart failure, CLL, coronary artery disease status post circumflex and LAD stents, hypertension, hyperlipidemia.  He was noted to have frequent PVCs.  Mexiletine with a great reduction in his PVC burden.  His ejection fraction has since improved.  Today, denies symptoms of palpitations, chest pain, shortness of breath, orthopnea, PND, lower extremity edema, claudication, dizziness, presyncope, syncope, bleeding, or neurologic sequela. [name]. ***   Past Medical History:  Diagnosis Date   Abnormal nuclear stress test 10/07/2016   Aftercare following surgery 02/22/2018   Cancer ([facility])    Cardiomyopathy ([place]) 12/09/2014   Overview:  Ejection fraction 30% May 2018   Cataract    were removed   Cataract    were removed   CHF (congestive heart failure) ([facility])    CLL (chronic lymphocytic leukemia) ([facility])    Coronary artery disease    Coronary artery disease involving native coronary artery of native heart without angina pectoris 12/09/2014   Formatting of this note might be different from the original. LAD and Cx stents 2018 Formatting of this note might be different from the original. PTCA and drug-eluting stent to left anterior descending artery and circumflex artery in May 2018   Dyslipidemia 12/09/2014   Essential hypertension 12/09/2014   GERD (gastroesophageal reflux disease)    Hyperlipidemia    Hypertension    Ingrown nail  02/15/2018   Intermittent lightheadedness 10/13/2016   LV dysfunction 04/11/2017   Pulmonary infiltrates on CXR 01/26/2017   Onset symptoms was late June 2018 and CT scan 12/05/16 c/w as dz LLL/ clear on f/u 01/26/2017  -        PVC's (premature ventricular contractions) 10/07/2016   Rhinitis, chronic 01/27/2017   Allergy profile 01/26/2017 >  Eos 0.1 /  IgE  53 RAST pos dust only    Past Surgical History:  Procedure Laterality Date   APPENDECTOMY     APPLICATION OF CRANIAL NAVIGATION Right 11/16/2020   Procedure: APPLICATION OF CRANIAL NAVIGATION;  Surgeon: Judith Part, MD;  Location: [facility];  Service: Neurosurgery;  Laterality: Right;   BASAL CELL CARCINOMA EXCISION  10/31/2019   cardiac stents     CRANIOTOMY Right 11/16/2020   Procedure: CRANIOTOMY FOR TUMOR EXCISION;  Surgeon: Judith Part, MD;  Location: [facility];  Service: Neurosurgery;  Laterality: Right;   EYE SURGERY     HERNIA REPAIR       Current Outpatient Medications  Medication Sig Dispense Refill   aspirin EC 81 MG tablet Take 1 tablet (81 mg total) by mouth daily. Restart on 11/23/20 90 tablet 3   atorvastatin (LIPITOR) 20 MG tablet Take 1 tablet (20 mg total) by mouth daily. 90 tablet 3   busPIRone (BUSPAR) 10 MG tablet Take 10 mg by mouth 2 (two) times daily as needed (anxiety attacks).     carvedilol (COREG) 6.25 MG tablet Take 1 tablet (6.25 mg total) by mouth 2 (two) times daily with  a meal. 180 tablet 3   CO ENZYME Q-10 PO Take 200 mg by mouth daily.     furosemide (LASIX) 40 MG tablet Take 1 tablet (40 mg total) by mouth daily as needed for edema. 90 tablet 3   levETIRAcetam (KEPPRA) 500 MG tablet Take 1 tablet (500 mg total) by mouth 2 (two) times daily. 60 tablet 5   mexiletine (MEXITIL) 150 MG capsule TAKE 1 CAPSULE BY MOUTH TWICE A DAY 180 capsule 2   nitroGLYCERIN (NITROSTAT) 0.4 MG SL tablet Place 1 tablet (0.4 mg total) under the tongue as needed for chest pain (For chest pain). (Patient not taking:  Reported on 02/23/2021) 30 tablet 3   Omega-3 Fatty Acids (FISH OIL) 1000 MG CAPS Take 1 capsule (1,000 mg total) by mouth daily. 90 capsule 3   ondansetron (ZOFRAN) 8 MG tablet Take 1 tablet by mouth 2 times daily as needed (nausea and vomiting). May take 30 - 60 minutes prior to Temodar administration if nausea/vomiting occurs. 30 tablet 1   pantoprazole (PROTONIX) 40 MG tablet Take 40 mg by mouth daily.     potassium chloride SA (KLOR-CON) 20 MEQ tablet Take 1 tablet (20 mEq total) by mouth daily. 90 tablet 3   spironolactone (ALDACTONE) 25 MG tablet Take 0.5 tablets (12.5 mg total) by mouth daily. 45 tablet 3   temozolomide (TEMODAR) 140 MG capsule Take 2 capsules (280 mg total) by mouth daily. May take on an empty stomach to decrease nausea & vomiting. 10 capsule 0   No current facility-administered medications for this visit.    Allergies:   Lisinopril, Niaspan [niacin er], and Simvastatin   Social History:  The patient  reports that he has never smoked. He has never used smokeless tobacco. He reports that he does not drink alcohol and does not use drugs.   Family History:  The patient's family history includes Alzheimer's disease in his maternal grandfather; Cancer in his paternal grandmother; Congestive Heart Failure in his father; Heart Problems in his sister; Heart attack in his maternal grandmother; Pneumonia in his mother; Stomach cancer in his father.   ROS:  Please see the history of present illness.   Otherwise, review of systems is positive for none.   All other systems are reviewed and negative.   PHYSICAL EXAM: VS:  There were no vitals taken for this visit. , BMI There is no height or weight on file to calculate BMI. GEN: Well nourished, well developed, in no acute distress  HEENT: normal  Neck: no JVD, carotid bruits, or masses Cardiac: ***RRR; no murmurs, rubs, or gallops,no edema  Respiratory:  clear to auscultation bilaterally, normal work of breathing GI: soft,  nontender, nondistended, + BS MS: no deformity or atrophy  Skin: warm and dry Neuro:  Strength and sensation are intact Psych: euthymic mood, full affect  EKG:  EKG {ACTION; IS/IS [id number]} ordered today. Personal review of the ekg ordered *** shows ***   Recent Labs: 03/29/2021: ALT 13; BUN 14; Creatinine 0.98; Hemoglobin 13.4; Platelet Count 187; Potassium 4.7; Sodium 140    Lipid Panel     Component Value Date/Time   CHOL 99 11/13/2020 1029   CHOL 152 11/05/2019 0813   TRIG 46 11/13/2020 1029   HDL 40 (L) 11/13/2020 1029   HDL 48 11/05/2019 0813   CHOLHDL 2.5 11/13/2020 1029   VLDL 9 11/13/2020 1029   LDLCALC 50 11/13/2020 1029   LDLCALC 88 11/05/2019 0813     Wt Readings from Last 3  Encounters:  03/29/21 158 lb 2 oz (71.7 kg)  03/10/21 157 lb 12.8 oz (71.6 kg)  02/23/21 161 lb 3.2 oz (73.1 kg)      Other studies Reviewed: Additional studies/ records that were reviewed today include: TTE  10/09/17 Review of the above records today demonstrates:  Normal left ventricular systolic function Ejection fraction is visually estimated at 55% Moderately dilated left atrium. Mild mitral, aortic and tricuspid regurgitation. RVSP 51 mm Hg.   ASSESSMENT AND PLAN:  1.  PVCs: Currently on mexiletine.  High risk medication monitoring via ECG.  PVCs are greatly improved with a normalized ejection fraction.  Continue current management.  2.  Chronic systolic heart failure: Ejection fraction improved on most recent echo.  We Ziggy Reveles continue with current management at this time.  He continues to exercise without issue.  3.  Coronary artery disease: No current chest pain.  4.  Hypertension:***   Current medicines are reviewed at length with the patient today.   The patient does not have concerns regarding his medicines.  The following changes were made today: ***  Labs/ tests ordered today include:  No orders of the defined types were placed in this  encounter.    Disposition:   FU with Khyli Swaim *** months  Signed, Mairlyn Tegtmeyer Meredith Leeds, MD  04/13/2021 1:38 PM     [address] [address] [phone] (office) [phone] (fax)

## 2021-04-19 ENCOUNTER — Telehealth: Payer: Self-pay | Admitting: *Deleted

## 2021-04-19 ENCOUNTER — Other Ambulatory Visit: Payer: Self-pay | Admitting: Internal Medicine

## 2021-04-19 ENCOUNTER — Other Ambulatory Visit: Payer: Self-pay | Admitting: Radiation Therapy

## 2021-04-19 MED ORDER — LEVETIRACETAM 750 MG PO TABS: 750.0000 mg | ORAL_TABLET | Freq: Two times a day (BID) | ORAL | 3 refills | Status: DC

## 2021-04-19 NOTE — Telephone Encounter (Signed)
Daughter called to report that patient has started having mild seizures.  Patient reports numbness/tingling in the mouth and lips tremble.  Episodes have happened approximately 2-3 times.  They are quickly resolved.  Patient hasn't missed any doses of Keppra 500 mg BID.    Encouraged daughter to get MRI scheduled since that hadn't been done yet.  Patient is due to see MD on 05/04/2021 and daughter wasn't sure if dose needed to be adjusted prior to that or if they needed to be seen sooner.  Routing to MD to advise.

## 2021-04-21 ENCOUNTER — Encounter: Payer: Self-pay | Admitting: Cardiology

## 2021-04-21 ENCOUNTER — Other Ambulatory Visit: Payer: Self-pay

## 2021-04-21 ENCOUNTER — Ambulatory Visit: Payer: PPO | Admitting: Cardiology

## 2021-04-21 VITALS — BP 150/76 | HR 76 | Ht 64.0 in | Wt 164.8 lb

## 2021-04-21 DIAGNOSIS — I420 Dilated cardiomyopathy: Secondary | ICD-10-CM

## 2021-04-21 DIAGNOSIS — C711 Malignant neoplasm of frontal lobe: Secondary | ICD-10-CM

## 2021-04-21 DIAGNOSIS — I2510 Atherosclerotic heart disease of native coronary artery without angina pectoris: Secondary | ICD-10-CM | POA: Diagnosis not present

## 2021-04-21 DIAGNOSIS — I10 Essential (primary) hypertension: Secondary | ICD-10-CM | POA: Diagnosis not present

## 2021-04-21 DIAGNOSIS — C9110 Chronic lymphocytic leukemia of B-cell type not having achieved remission: Secondary | ICD-10-CM

## 2021-04-21 NOTE — Patient Instructions (Signed)

## 2021-04-21 NOTE — Progress Notes (Signed)
Cardiology Office Note:    Date:  04/21/2021   ID:  Nathan Hall, DOB 07-Jun-1934, MRN [id number]  PCP:  Street, Sharon Mt, MD  Cardiologist:  Jenne Campus, MD    Referring MD: Street, Sharon Mt, *   Chief Complaint  Patient presents with   Follow-up  Doing fine considering radiation chemotherapy that he went through  History of Present Illness:    Nathan Hall is a 86 y.o. male   with past medical history significant for remote coronary artery disease, status post microinfarction 2000, dyslipidemia, cardiomyopathy with ejection fraction [place] 45%, essential hypertension, frequent PVCs successfully treated with beta-blocker and mexiletine.  Recently he was discovered to have a glioblastoma on his brain.  Apparently what triggered this was a seizures MRI CT was done and he was found to have tumor he did have surgery and going through chemotherapy and radiation therapy.  Cardiac wise seems to be doing well.  Echocardiogram repeated showed ejection fraction 4045%. Is coming today 2 months of follow-up.  I try to give him losartan however he was unable to tolerate it.  He did went through chemotherapy as well as radiation which was very tough on him but overall seems to be doing well.  He does have some cognitive problem according to his wife but otherwise he did not lose any functions.  Past Medical History:  Diagnosis Date   Abnormal nuclear stress test 10/07/2016   Aftercare following surgery 02/22/2018   Cancer ([facility])    Cardiomyopathy ([facility]) 12/09/2014   Overview:  Ejection fraction 30% May 2018   Cataract    were removed   Cataract    were removed   CHF (congestive heart failure) ([facility])    CLL (chronic lymphocytic leukemia) ([facility])    Coronary artery disease    Coronary artery disease involving native coronary artery of native heart without angina pectoris 12/09/2014   Formatting of this note might be different from the original. LAD and Cx stents 2018  Formatting of this note might be different from the original. PTCA and drug-eluting stent to left anterior descending artery and circumflex artery in May 2018   Dyslipidemia 12/09/2014   Essential hypertension 12/09/2014   GERD (gastroesophageal reflux disease)    Hyperlipidemia    Hypertension    Ingrown nail 02/15/2018   Intermittent lightheadedness 10/13/2016   LV dysfunction 04/11/2017   Pulmonary infiltrates on CXR 01/26/2017   Onset symptoms was late June 2018 and CT scan 12/05/16 c/w as dz LLL/ clear on f/u 01/26/2017  -        PVC's (premature ventricular contractions) 10/07/2016   Rhinitis, chronic 01/27/2017   Allergy profile 01/26/2017 >  Eos 0.1 /  IgE  53 RAST pos dust only     Past Surgical History:  Procedure Laterality Date   APPENDECTOMY     APPLICATION OF CRANIAL NAVIGATION Right 11/16/2020   Procedure: APPLICATION OF CRANIAL NAVIGATION;  Surgeon: Judith Part, MD;  Location: [facility];  Service: Neurosurgery;  Laterality: Right;   BASAL CELL CARCINOMA EXCISION  10/31/2019   cardiac stents     CRANIOTOMY Right 11/16/2020   Procedure: CRANIOTOMY FOR TUMOR EXCISION;  Surgeon: Judith Part, MD;  Location: [facility];  Service: Neurosurgery;  Laterality: Right;   EYE SURGERY     HERNIA REPAIR      Current Medications: Current Meds  Medication Sig   aspirin EC 81 MG tablet Take 1 tablet (81 mg total) by mouth daily. Restart on  11/23/20   atorvastatin (LIPITOR) 20 MG tablet Take 1 tablet (20 mg total) by mouth daily.   busPIRone (BUSPAR) 10 MG tablet Take 10 mg by mouth 2 (two) times daily as needed (anxiety attacks).   carvedilol (COREG) 6.25 MG tablet Take 1 tablet (6.25 mg total) by mouth 2 (two) times daily with a meal.   CO ENZYME Q-10 PO Take 200 mg by mouth daily. Unknown strength   furosemide (LASIX) 40 MG tablet Take 1 tablet (40 mg total) by mouth daily as needed for edema.   levETIRAcetam (KEPPRA) 750 MG tablet Take 1 tablet (750 mg total) by mouth 2 (two) times  daily.   mexiletine (MEXITIL) 150 MG capsule TAKE 1 CAPSULE BY MOUTH TWICE A DAY (Patient taking differently: Take 150 mg by mouth 2 (two) times daily.)   nitroGLYCERIN (NITROSTAT) 0.4 MG SL tablet Place 1 tablet (0.4 mg total) under the tongue as needed for chest pain (For chest pain).   Omega-3 Fatty Acids (FISH OIL) 1000 MG CAPS Take 1 capsule (1,000 mg total) by mouth daily.   ondansetron (ZOFRAN) 8 MG tablet Take 1 tablet by mouth 2 times daily as needed (nausea and vomiting). May take 30 - 60 minutes prior to Temodar administration if nausea/vomiting occurs. (Patient taking differently: Take 8 mg by mouth 2 (two) times daily as needed for nausea or vomiting (nausea and vomiting).)   pantoprazole (PROTONIX) 40 MG tablet Take 40 mg by mouth daily.   potassium chloride SA (KLOR-CON) 20 MEQ tablet Take 1 tablet (20 mEq total) by mouth daily.   spironolactone (ALDACTONE) 25 MG tablet Take 0.5 tablets (12.5 mg total) by mouth daily.   temozolomide (TEMODAR) 140 MG capsule Take 2 capsules (280 mg total) by mouth daily. May take on an empty stomach to decrease nausea & vomiting.     Allergies:   Lisinopril, Niaspan [niacin er], and Simvastatin   Social History   Socioeconomic History   Marital status: Married    Spouse name: Not on file   Number of children: Not on file   Years of education: Not on file   Highest education level: Not on file  Occupational History   Not on file  Tobacco Use   Smoking status: Never   Smokeless tobacco: Never   Tobacco comments:    States years ago/did not inhale/brief period/can't remeber dates  Vaping Use   Vaping Use: Never used  Substance and Sexual Activity   Alcohol use: No   Drug use: No   Sexual activity: Not Currently  Other Topics Concern   Not on file  Social History Narrative   Not on file   Social Determinants of Health   Financial Resource Strain: Not on file  Food Insecurity: Not on file  Transportation Needs: Not on file   Physical Activity: Not on file  Stress: Not on file  Social Connections: Not on file     Family History: The patient's family history includes Alzheimer's disease in his maternal grandfather; Cancer in his paternal grandmother; Congestive Heart Failure in his father; Heart Problems in his sister; Heart attack in his maternal grandmother; Pneumonia in his mother; Stomach cancer in his father. ROS:   Please see the history of present illness.    All 14 point review of systems negative except as described per history of present illness  EKGs/Labs/Other Studies Reviewed:      Recent Labs: 03/29/2021: ALT 13; BUN 14; Creatinine 0.98; Hemoglobin 13.4; Platelet Count 187; Potassium 4.7; Sodium  140  Recent Lipid Panel    Component Value Date/Time   CHOL 99 11/13/2020 1029   CHOL 152 11/05/2019 0813   TRIG 46 11/13/2020 1029   HDL 40 (L) 11/13/2020 1029   HDL 48 11/05/2019 0813   CHOLHDL 2.5 11/13/2020 1029   VLDL 9 11/13/2020 1029   LDLCALC 50 11/13/2020 1029   LDLCALC 88 11/05/2019 0813    Physical Exam:    VS:  BP (!) 150/76 (BP Location: Left Arm, Patient Position: Sitting)   Pulse 76   Ht 5\' 4"  (1.626 m)   Wt 164 lb 12.8 oz (74.8 kg)   SpO2 95%   BMI 28.29 kg/m     Wt Readings from Last 3 Encounters:  04/21/21 164 lb 12.8 oz (74.8 kg)  03/29/21 158 lb 2 oz (71.7 kg)  03/10/21 157 lb 12.8 oz (71.6 kg)     GEN:  Well nourished, well developed in no acute distress HEENT: Normal NECK: No JVD; No carotid bruits LYMPHATICS: No lymphadenopathy CARDIAC: RRR, no murmurs, no rubs, no gallops RESPIRATORY:  Clear to auscultation without rales, wheezing or rhonchi  ABDOMEN: Soft, non-tender, non-distended MUSCULOSKELETAL:  No edema; No deformity  SKIN: Warm and dry LOWER EXTREMITIES: no swelling NEUROLOGIC:  Alert and oriented x 3 PSYCHIATRIC:  Normal affect   ASSESSMENT:    1. Coronary artery disease involving native coronary artery of native heart without angina  pectoris   2. Dilated cardiomyopathy ([facility])   3. Primary hypertension   4. Glioblastoma multiforme of frontal lobe ([facility])   5. CLL (chronic lymphocytic leukemia) ([facility])    PLAN:    In order of problems listed above:  Coronary disease stable from that point review we will continue present management. Dilated cardiomyopathy with ejection fraction 40 to 45%, unable to tolerate Entresto, losartan.  We will continue present management. Frequent ventricular ectopy suppressed with cardiac as well as mexiletine which I will continue. Glioblastoma multiforme and that being follow-up excellently by neurology team. CLL: Stable followed by oncology.   Medication Adjustments/Labs and Tests Ordered: Current medicines are reviewed at length with the patient today.  Concerns regarding medicines are outlined above.  No orders of the defined types were placed in this encounter.  Medication changes: No orders of the defined types were placed in this encounter.   Signed, Park Liter, MD, [facility] 04/21/2021 3:56 PM    [place]

## 2021-04-29 ENCOUNTER — Other Ambulatory Visit: Payer: Self-pay

## 2021-04-29 ENCOUNTER — Ambulatory Visit (HOSPITAL_COMMUNITY)
Admission: RE | Admit: 2021-04-29 | Discharge: 2021-04-29 | Disposition: A | Discharge status: Home / Self Care | Payer: PPO | Source: Ambulatory Visit | Attending: Internal Medicine | Admitting: Internal Medicine

## 2021-04-29 DIAGNOSIS — G9389 Other specified disorders of brain: Secondary | ICD-10-CM | POA: Diagnosis not present

## 2021-04-29 DIAGNOSIS — C711 Malignant neoplasm of frontal lobe: Secondary | ICD-10-CM | POA: Diagnosis not present

## 2021-04-29 MED ORDER — GADOBUTROL 1 MMOL/ML IV SOLN
8.0000 mL | Freq: Once | INTRAVENOUS | Status: AC | PRN
  Administered 2021-04-29: 8 mL via INTRAVENOUS

## 2021-05-04 ENCOUNTER — Inpatient Hospital Stay: Payer: PPO | Attending: Internal Medicine | Admitting: Internal Medicine

## 2021-05-04 ENCOUNTER — Other Ambulatory Visit: Payer: Self-pay

## 2021-05-04 VITALS — BP 140/76 | HR 70 | Temp 97.6°F | Resp 18 | Ht 64.0 in | Wt 165.9 lb

## 2021-05-04 DIAGNOSIS — C711 Malignant neoplasm of frontal lobe: Secondary | ICD-10-CM | POA: Insufficient documentation

## 2021-05-04 DIAGNOSIS — R569 Unspecified convulsions: Secondary | ICD-10-CM

## 2021-05-04 DIAGNOSIS — C9110 Chronic lymphocytic leukemia of B-cell type not having achieved remission: Secondary | ICD-10-CM | POA: Insufficient documentation

## 2021-05-04 NOTE — Progress Notes (Signed)
[name] [place], [place] [address] [phone]   Interval Evaluation  Date of Service: 05/04/21 Patient Name: Nathan Hall Patient MRN: [id number] Patient DOB: Apr 27, 1935 Provider: Ventura Sellers, MD  Identifying Statement:  Nathan Hall is a 86 y.o. male with right temporal glioblastoma   Oncologic History: Oncology History  CLL (chronic lymphocytic leukemia) ([place])  05/30/2008 Cancer Staging   Staging form: Chronic Lymphocytic Leukemia / Small Lymphocytic Lymphoma, AJCC 8th Edition - Clinical stage from 05/30/2008: Modified Rai Stage 0 (Modified Rai risk: Low, Binet: Stage A, Lymphocytosis: Present, Adenopathy: Absent, Organomegaly: Absent, Anemia: Absent, Thrombocytopenia: Absent) - Signed by Derwood Kaplan, MD on 03/15/2021 Histopathologic type: B-cell lymphocytic leukemia/small lymphocytic lymphoma (see also [id number]/3) Stage prefix: Initial diagnosis Stage used in treatment planning: Yes National guidelines used in treatment planning: Yes Type of national guideline used in treatment planning: NCCN    05/01/2020 Initial Diagnosis   CLL (chronic lymphocytic leukemia) ([place])   Glioblastoma multiforme of frontal lobe ([place])  11/16/2020 Surgery   Craniotomy, resection of R temporal mass by Dr. Jearl Klinefelter; path demonstrates glioblastoma   11/16/2020 Cancer Staging   Staging form: Brain and Spinal Cord, AJCC 8th Edition - Pathologic stage from 11/16/2020: WHO Grade IV - Signed by Ventura Sellers, MD on 12/10/2020 Stage prefix: Initial diagnosis Histologic grading system: 4 grade system Extent of surgical resection: Complete (gross) resection Solitary (s) or multifocal (m) tumors in the primary site: Solitary Tumor location in brain: Noneloquent brain area Karnofsky performance status: Score 80 Seizures at presentation: Present Duration of symptoms before diagnosis: Short IDH1 mutation: Negative IDH2  mutation: Unknown 1p loss of heterozygosity (LOH): Unknown 19q loss of heterozygosity (LOH): Unknown MGMT methylation: Absent    12/21/2020 - 12/21/2020 Chemotherapy   Patient is on Treatment Plan : BRAIN GLIOBLASTOMA Radiation Therapy With Concurrent Temozolomide 75 mg/m2 Daily Followed By Sequential Maintenance Temozolomide x 6-12 cycles     02/28/2021 -  Chemotherapy   Patient is on Treatment Plan : BRAIN GLIOBLASTOMA Consolidation Temozolomide Days 1-5 q28 Days        Biomarkers:  MGMT Unmethylated.  IDH 1/2 Unknown.  EGFR Unknown  TERT Unknown   Interval History: Nathan Hall presents today for follow up, now 1 month removed from first cycle of Temodar.  He describes overall stability in terms of energy, maybe slightly improved from last month.  Overall is happier without having to take the chemo.  Continues to get out on his riding mower without disability.  No further seizures.  H+P (12/01/20) Patient presented to medical attention on 11/13/20 with first ever seizure.  He describes involuntary nocturnal twitching of the left side of his face accompanied by slurred speech.  This resolved prior to arriving at the ED later the next morning, more or less at his baseline.  CNS imaging demonstrated enhancing mass in the right temporal lobe consistent with tumor, this was resected by Dr. Zada Finders on 11/16/20; path was glioblastoma.  Today he feels more or less at baseline, but complains of excess fatigue compared to prior to surgery.  He likes to cut grass on his riding mower.  Medications: Current Outpatient Medications on File Prior to Visit  Medication Sig Dispense Refill   aspirin EC 81 MG tablet Take 1 tablet (81 mg total) by mouth daily. Restart on 11/23/20 90 tablet 3   atorvastatin (LIPITOR) 20 MG tablet Take 1 tablet (20 mg total) by mouth daily. [place]  tablet 3   busPIRone (BUSPAR) 10 MG tablet Take 10 mg by mouth 2 (two) times daily as needed (anxiety attacks).     carvedilol  (COREG) 6.25 MG tablet Take 1 tablet (6.25 mg total) by mouth 2 (two) times daily with a meal. 180 tablet 3   CO ENZYME Q-10 PO Take 200 mg by mouth daily. Unknown strength     furosemide (LASIX) 40 MG tablet Take 1 tablet (40 mg total) by mouth daily as needed for edema. 90 tablet 3   levETIRAcetam (KEPPRA) 750 MG tablet Take 1 tablet (750 mg total) by mouth 2 (two) times daily. 60 tablet 3   mexiletine (MEXITIL) 150 MG capsule TAKE 1 CAPSULE BY MOUTH TWICE A DAY (Patient taking differently: Take 150 mg by mouth 2 (two) times daily.) 180 capsule 2   nitroGLYCERIN (NITROSTAT) 0.4 MG SL tablet Place 1 tablet (0.4 mg total) under the tongue as needed for chest pain (For chest pain). 30 tablet 3   Omega-3 Fatty Acids (FISH OIL) 1000 MG CAPS Take 1 capsule (1,000 mg total) by mouth daily. 90 capsule 3   ondansetron (ZOFRAN) 8 MG tablet Take 1 tablet by mouth 2 times daily as needed (nausea and vomiting). May take 30 - 60 minutes prior to Temodar administration if nausea/vomiting occurs. (Patient taking differently: Take 8 mg by mouth 2 (two) times daily as needed for nausea or vomiting (nausea and vomiting).) 30 tablet 1   pantoprazole (PROTONIX) 40 MG tablet Take 40 mg by mouth daily.     potassium chloride SA (KLOR-CON) 20 MEQ tablet Take 1 tablet (20 mEq total) by mouth daily. 90 tablet 3   spironolactone (ALDACTONE) 25 MG tablet Take 0.5 tablets (12.5 mg total) by mouth daily. 45 tablet 3   temozolomide (TEMODAR) 140 MG capsule Take 2 capsules (280 mg total) by mouth daily. May take on an empty stomach to decrease nausea & vomiting. 10 capsule 0   No current facility-administered medications on file prior to visit.    Allergies:  Allergies  Allergen Reactions   Lisinopril Itching   Niaspan [Niacin Er] Itching   Simvastatin Itching   Past Medical History:  Past Medical History:  Diagnosis Date   Abnormal nuclear stress test 10/07/2016   Aftercare following surgery 02/22/2018   Cancer ([place])     Cardiomyopathy ([place]) 12/09/2014   Overview:  Ejection fraction 30% May 2018   Cataract    were removed   Cataract    were removed   CHF (congestive heart failure) ([place])    CLL (chronic lymphocytic leukemia) ([place])    Coronary artery disease    Coronary artery disease involving native coronary artery of native heart without angina pectoris 12/09/2014   Formatting of this note might be different from the original. LAD and Cx stents 2018 Formatting of this note might be different from the original. PTCA and drug-eluting stent to left anterior descending artery and circumflex artery in May 2018   Dyslipidemia 12/09/2014   Essential hypertension 12/09/2014   GERD (gastroesophageal reflux disease)    Hyperlipidemia    Hypertension    Ingrown nail 02/15/2018   Intermittent lightheadedness 10/13/2016   LV dysfunction 04/11/2017   Pulmonary infiltrates on CXR 01/26/2017   Onset symptoms was late June 2018 and CT scan 12/05/16 c/w as dz LLL/ clear on f/u 01/26/2017  -        PVC's (premature ventricular contractions) 10/07/2016   Rhinitis, chronic 01/27/2017   Allergy profile 01/26/2017 >  Eos 0.1 /  IgE  53 RAST pos dust only    Past Surgical History:  Past Surgical History:  Procedure Laterality Date   APPENDECTOMY     APPLICATION OF CRANIAL NAVIGATION Right 11/16/2020   Procedure: APPLICATION OF CRANIAL NAVIGATION;  Surgeon: Judith Part, MD;  Location: [facility];  Service: Neurosurgery;  Laterality: Right;   BASAL CELL CARCINOMA EXCISION  10/31/2019   cardiac stents     CRANIOTOMY Right 11/16/2020   Procedure: CRANIOTOMY FOR TUMOR EXCISION;  Surgeon: Judith Part, MD;  Location: [facility];  Service: Neurosurgery;  Laterality: Right;   EYE SURGERY     HERNIA REPAIR     Social History:  Social History   Socioeconomic History   Marital status: Married    Spouse name: Not on file   Number of children: Not on file   Years of education: Not on file   Highest education level: Not on file   Occupational History   Not on file  Tobacco Use   Smoking status: Never   Smokeless tobacco: Never   Tobacco comments:    States years ago/did not inhale/brief period/can't remeber dates  Vaping Use   Vaping Use: Never used  Substance and Sexual Activity   Alcohol use: No   Drug use: No   Sexual activity: Not Currently  Other Topics Concern   Not on file  Social History Narrative   Not on file   Social Determinants of Health   Financial Resource Strain: Not on file  Food Insecurity: Not on file  Transportation Needs: Not on file  Physical Activity: Not on file  Stress: Not on file  Social Connections: Not on file  Intimate Partner Violence: Not on file   Family History:  Family History  Problem Relation Age of Onset   Pneumonia Mother    Stomach cancer Father    Congestive Heart Failure Father    Heart attack Maternal Grandmother    Alzheimer's disease Maternal Grandfather    Cancer Paternal Grandmother    Heart Problems Sister     Review of Systems: Constitutional: Doesn't report fevers, chills or abnormal weight loss Eyes: Doesn't report blurriness of vision Ears, nose, mouth, throat, and face: Doesn't report sore throat Respiratory: Doesn't report cough, dyspnea or wheezes Cardiovascular: Doesn't report palpitation, chest discomfort  Gastrointestinal:  Doesn't report nausea, constipation, diarrhea GU: Doesn't report incontinence Skin: Doesn't report skin rashes Neurological: Per HPI Musculoskeletal: Doesn't report joint pain Behavioral/Psych: Doesn't report anxiety  Physical Exam: Vitals:   05/04/21 0930  BP: 140/76  Pulse: 70  Resp: 18  Temp: 97.6 F (36.4 C)  SpO2: 98%    KPS: 80. General: Alert, cooperative, pleasant, in no acute distress Head: Normal EENT: No conjunctival injection or scleral icterus.  Lungs: Resp effort normal Cardiac: Regular rate Abdomen: Non-distended abdomen Skin: No rashes cyanosis or petechiae. Extremities: No  clubbing or edema  Neurologic Exam: Mental Status: Awake, alert, attentive to examiner. Oriented to self and environment. Language is fluent with intact comprehension.  Cranial Nerves: Visual acuity is grossly normal. Visual fields are full. Extra-ocular movements intact. No ptosis. Face is symmetric Motor: Tone and bulk are normal. Power is full in both arms and legs. Reflexes are symmetric, no pathologic reflexes present.  Sensory: Intact to light touch Gait: Normal.   Labs: I have reviewed the data as listed    Component Value Date/Time   NA 140 03/29/2021 0857   NA 139 03/10/2021 0000   K  4.7 03/29/2021 0857   CL 105 03/29/2021 0857   CO2 30 03/29/2021 0857   GLUCOSE 95 03/29/2021 0857   BUN 14 03/29/2021 0857   BUN 13 03/10/2021 0000   CREATININE 0.98 03/29/2021 0857   CALCIUM 9.1 03/29/2021 0857   PROT 6.0 (L) 03/29/2021 0857   PROT 6.0 03/29/2018 1103   ALBUMIN 3.6 03/29/2021 0857   ALBUMIN 4.1 03/29/2018 1103   AST 17 03/29/2021 0857   ALT 13 03/29/2021 0857   ALKPHOS 68 03/29/2021 0857   BILITOT 0.9 03/29/2021 0857   GFRNONAA >60 03/29/2021 0857   GFRAA 63 05/04/2020 0000   Lab Results  Component Value Date   WBC 6.5 03/29/2021   NEUTROABS 3.3 03/29/2021   HGB 13.4 03/29/2021   HCT 39.8 03/29/2021   MCV 96.1 03/29/2021   PLT 187 03/29/2021    Imaging:  [place] Clinician Interpretation: I have personally reviewed the CNS images as listed.  My interpretation, in the context of the patient's clinical presentation, is stable disease  MR BRAIN W WO CONTRAST  Result Date: 05/01/2021 CLINICAL DATA:  Glioblastoma, assess treatment response EXAM: MRI HEAD WITHOUT AND WITH CONTRAST TECHNIQUE: Multiplanar, multiecho pulse sequences of the brain and surrounding structures were obtained without and with intravenous contrast. CONTRAST:  60m GADAVIST GADOBUTROL 1 MMOL/ML IV SOLN COMPARISON:  02/19/2021 FINDINGS: Brain: No acute infarct, mass effect or extra-axial collection.  No acute or chronic hemorrhage. Hyperintense T2-weighted signal is moderately widespread throughout the white matter. Generalized volume loss without a clear lobar predilection. 9 mm contrast enhancing lesion of the right frontal operculum is unchanged (series 16, image 77). There is slightly worsened hyperintense T2-weighted signal within right temporal lobe series 11, image 24) the midline structures are normal. Vascular: Major flow voids are preserved. Skull and upper cervical spine: Normal calvarium and skull base. Visualized upper cervical spine and soft tissues are normal. Sinuses/Orbits:No paranasal sinus fluid levels or advanced mucosal thickening. No mastoid or middle ear effusion. Normal orbits. IMPRESSION: 1. Unchanged 9 mm contrast enhancing focus of the right frontal operculum, at the operative site, favored to be residual or recurrent tumor. 2. Slight worsening of hyperintense T2-weighted signal within the right temporal lobe. Electronically Signed   By: KUlyses JarredM.D.   On: 05/01/2021 00:38     Assessment/Plan Glioblastoma multiforme of frontal lobe ([place] [C71.1]  WRosendo Grosis clinically and radiographically stable today, now one month removed from cycle #1 of adjuvant Temodar.  Because of poor tolerance and quality of life issues, we will continue holding the chemo.  Should continue Keppra 501mBID, remain off dexamethasone as prior.  We ask that Nathan Groseturn to clinic in 3 months with MRI for evluation.  All questions were answered. The patient knows to call the clinic with any problems, questions or concerns. No barriers to learning were detected.  The total time spent in the encounter was 30 minutes and more than 50% was on counseling and review of test results   ZaVentura SellersMD Medical Director of Neuro-Oncology [facility]2/06/22 9:27 AM

## 2021-05-05 ENCOUNTER — Telehealth: Payer: Self-pay | Admitting: Internal Medicine

## 2021-05-05 ENCOUNTER — Ambulatory Visit: Payer: PPO | Admitting: Cardiology

## 2021-05-05 ENCOUNTER — Encounter: Payer: Self-pay | Admitting: Cardiology

## 2021-05-05 VITALS — BP 160/80 | HR 79 | Ht 64.0 in | Wt 166.0 lb

## 2021-05-05 DIAGNOSIS — I493 Ventricular premature depolarization: Secondary | ICD-10-CM | POA: Diagnosis not present

## 2021-05-05 NOTE — Progress Notes (Signed)
Electrophysiology Office Note   Date:  05/05/2021   ID:  Mckay, Tegtmeyer Feb 19, 1935, MRN [id number]  PCP:  Street, Sharon Mt, MD  Cardiologist:  Agustin Cree Primary Electrophysiologist:  Theodora Lalanne Meredith Leeds, MD    Chief Complaint  Patient presents with   Follow-up      History of Present Illness: Torien Ramroop is a 86 y.o. male who is being seen today for the evaluation of PVCs at the request of [name]. Presenting today for electrophysiology evaluation.    He has a history significant for chronic systolic heart failure, CLL, coronary artery disease status post circumflex and LAD stents, hypertension, hyperlipidemia.  He was noted to have frequent PVCs.  He was started on mexiletine with an improvement in his PVC burden.  Unfortunately, he was recently diagnosed with a glioblastoma.  He has had surgery and is now undergoing chemotherapy and radiation.  Today, denies symptoms of palpitations, chest pain, shortness of breath, orthopnea, PND, lower extremity edema, claudication, dizziness, presyncope, syncope, bleeding, or neurologic sequela. [name].  Since being seen he has done well.  He has noted no further PVCs and feels that his heart health is good.  Unfortunately he was diagnosed with glioblastoma.  He is now done with radiation and chemotherapy until February.  He is feeling well.   Past Medical History:  Diagnosis Date   Abnormal nuclear stress test 10/07/2016   Aftercare following surgery 02/22/2018   Cancer ([place])    Cardiomyopathy ([place]) 12/09/2014   Overview:  Ejection fraction 30% May 2018   Cataract    were removed   Cataract    were removed   CHF (congestive heart failure) ([place])    CLL (chronic lymphocytic leukemia) ([place])    Coronary artery disease    Coronary artery disease involving native coronary artery of native heart without angina pectoris 12/09/2014   Formatting of this note might be  different from the original. LAD and Cx stents 2018 Formatting of this note might be different from the original. PTCA and drug-eluting stent to left anterior descending artery and circumflex artery in May 2018   Dyslipidemia 12/09/2014   Essential hypertension 12/09/2014   GERD (gastroesophageal reflux disease)    Hyperlipidemia    Hypertension    Ingrown nail 02/15/2018   Intermittent lightheadedness 10/13/2016   LV dysfunction 04/11/2017   Pulmonary infiltrates on CXR 01/26/2017   Onset symptoms was late June 2018 and CT scan 12/05/16 c/w as dz LLL/ clear on f/u 01/26/2017  -        PVC's (premature ventricular contractions) 10/07/2016   Rhinitis, chronic 01/27/2017   Allergy profile 01/26/2017 >  Eos 0.1 /  IgE  53 RAST pos dust only    Past Surgical History:  Procedure Laterality Date   APPENDECTOMY     APPLICATION OF CRANIAL NAVIGATION Right 11/16/2020   Procedure: APPLICATION OF CRANIAL NAVIGATION;  Surgeon: Judith Part, MD;  Location: [facility];  Service: Neurosurgery;  Laterality: Right;   BASAL CELL CARCINOMA EXCISION  10/31/2019   cardiac stents     CRANIOTOMY Right 11/16/2020   Procedure: CRANIOTOMY FOR TUMOR EXCISION;  Surgeon: Judith Part, MD;  Location: [facility];  Service: Neurosurgery;  Laterality: Right;   EYE SURGERY     HERNIA REPAIR       Current Outpatient Medications  Medication Sig Dispense Refill   aspirin EC 81 MG tablet Take 1 tablet (81 mg total) by  mouth daily. Restart on 11/23/20 90 tablet 3   atorvastatin (LIPITOR) 20 MG tablet Take 1 tablet (20 mg total) by mouth daily. 90 tablet 3   busPIRone (BUSPAR) 10 MG tablet Take 10 mg by mouth 2 (two) times daily as needed (anxiety attacks).     carvedilol (COREG) 25 MG tablet Take 25 mg by mouth 2 (two) times daily.     CO ENZYME Q-10 PO Take 200 mg by mouth daily. Unknown strength     furosemide (LASIX) 40 MG tablet Take 1 tablet (40 mg total) by mouth daily as needed for edema. 90 tablet 3   levETIRAcetam  (KEPPRA) 750 MG tablet Take 1 tablet (750 mg total) by mouth 2 (two) times daily. 60 tablet 3   mexiletine (MEXITIL) 150 MG capsule TAKE 1 CAPSULE BY MOUTH TWICE A DAY 180 capsule 2   nitroGLYCERIN (NITROSTAT) 0.4 MG SL tablet Place 1 tablet (0.4 mg total) under the tongue as needed for chest pain (For chest pain). 30 tablet 3   Omega-3 Fatty Acids (FISH OIL) 1000 MG CAPS Take 1 capsule (1,000 mg total) by mouth daily. 90 capsule 3   pantoprazole (PROTONIX) 40 MG tablet Take 40 mg by mouth daily.     potassium chloride SA (KLOR-CON) 20 MEQ tablet Take 1 tablet (20 mEq total) by mouth daily. 90 tablet 3   spironolactone (ALDACTONE) 25 MG tablet Take 0.5 tablets (12.5 mg total) by mouth daily. 45 tablet 3   No current facility-administered medications for this visit.    Allergies:   Lisinopril, Niaspan [niacin er], and Simvastatin   Social History:  The patient  reports that he has never smoked. He has never used smokeless tobacco. He reports that he does not drink alcohol and does not use drugs.   Family History:  The patient's family history includes Alzheimer's disease in his maternal grandfather; Cancer in his paternal grandmother; Congestive Heart Failure in his father; Heart Problems in his sister; Heart attack in his maternal grandmother; Pneumonia in his mother; Stomach cancer in his father.   ROS:  Please see the history of present illness.   Otherwise, review of systems is positive for none.   All other systems are reviewed and negative.   PHYSICAL EXAM: VS:  BP (!) 160/80 (BP Location: Right Arm, Patient Position: Sitting, Cuff Size: Normal)   Pulse 79   Ht 5\' 4"  (1.626 m)   Wt 166 lb (75.3 kg)   SpO2 95%   BMI 28.49 kg/m  , BMI Body mass index is 28.49 kg/m. GEN: Well nourished, well developed, in no acute distress  HEENT: normal  Neck: no JVD, carotid bruits, or masses Cardiac: RRR; no murmurs, rubs, or gallops,no edema  Respiratory:  clear to auscultation bilaterally,  normal work of breathing GI: soft, nontender, nondistended, + BS MS: no deformity or atrophy  Skin: warm and dry Neuro:  Strength and sensation are intact Psych: euthymic mood, full affect  EKG:  EKG is ordered today. Personal review of the ekg ordered shows sinus rhythm, LVH, nonspecific T wave changes  Recent Labs: 03/29/2021: ALT 13; BUN 14; Creatinine 0.98; Hemoglobin 13.4; Platelet Count 187; Potassium 4.7; Sodium 140    Lipid Panel     Component Value Date/Time   CHOL 99 11/13/2020 1029   CHOL 152 11/05/2019 0813   TRIG 46 11/13/2020 1029   HDL 40 (L) 11/13/2020 1029   HDL 48 11/05/2019 0813   CHOLHDL 2.5 11/13/2020 1029   VLDL 9 11/13/2020  [place] 11/13/2020 1029   LDLCALC 88 11/05/2019 0813     Wt Readings from Last 3 Encounters:  05/05/21 166 lb (75.3 kg)  05/04/21 165 lb 14.4 oz (75.3 kg)  04/21/21 164 lb 12.8 oz (74.8 kg)      Other studies Reviewed: Additional studies/ records that were reviewed today include: TTE  11/13/20  1. Left ventricular ejection fraction, by estimation, is 35 to 40%. The  left ventricle has moderately decreased function. The left ventricle  demonstrates regional wall motion abnormalities (see scoring  diagram/findings for description). The left  ventricular internal cavity size was mildly dilated. Left ventricular  diastolic parameters are consistent with Grade I diastolic dysfunction  (impaired relaxation). There is moderate dyskinesis of the left  ventricular, basal-mid inferior wall.   2. Right ventricular systolic function is normal. The right ventricular  size is normal. There is normal pulmonary artery systolic pressure.   3. The mitral valve is grossly normal. Mild mitral valve regurgitation.   4. The aortic valve is tricuspid. Aortic valve regurgitation is moderate.    ASSESSMENT AND PLAN:  1.  PVCs: Currently on mexiletine 150 mg twice daily.  High risk medication monitoring.  Fortunately his ejection  fraction has normalized.  We Armonte Tortorella continue with current management.  2.  Chronic systolic heart failure: Ejection fraction has improved on most recent echo.  We Sherley Leser continue with current management.  3.  Coronary artery disease: No current chest pain  4.  Hypertension: Blood pressure is elevated today.  It has been well controlled in the past.  He has close follow-up with oncology and primary physician.  We Angelle Isais allow them to make adjustments to his medications.   Current medicines are reviewed at length with the patient today.   The patient does not have concerns regarding his medicines.  The following changes were made today: None  Labs/ tests ordered today include:  Orders Placed This Encounter  Procedures   EKG 12-Lead      Disposition:   FU with Pandora Mccrackin 6 months  Signed, Wilson Dusenbery Meredith Leeds, MD  05/05/2021 1:57 PM     [address] [place] [address] [phone] (office) [phone] (fax)

## 2021-05-05 NOTE — Telephone Encounter (Signed)
Scheduled per 12/6 los, message has been left with pt and calender also mailed

## 2021-05-10 ENCOUNTER — Ambulatory Visit: Payer: PPO | Admitting: Cardiology

## 2021-05-18 ENCOUNTER — Telehealth: Payer: Self-pay | Admitting: Cardiology

## 2021-05-18 NOTE — Telephone Encounter (Signed)
°*  STAT* If patient is at the pharmacy, call can be transferred to refill team.   1. Which medications need to be refilled? (please list name of each medication and dose if known) atorvastatin (LIPITOR) 20 MG tablet; carvedilol (COREG) 25 MG tablet; furosemide (LASIX) 40 MG tablet; mexiletine (MEXITIL) 150 MG capsule; potassium chloride SA (KLOR-CON) 20 MEQ tablet; spironolactone (ALDACTONE) 25 MG tablet  2. Which pharmacy/location (including street and city if local pharmacy) is medication to be sent to? [facility] - [place], [place] - [place] [address]  3. Do they need a 30 day or 90 day supply? [place]

## 2021-05-31 ENCOUNTER — Other Ambulatory Visit: Payer: Self-pay | Admitting: Cardiology

## 2021-06-02 DIAGNOSIS — I700 Atherosclerosis of aorta: Secondary | ICD-10-CM | POA: Diagnosis not present

## 2021-06-02 DIAGNOSIS — Z515 Encounter for palliative care: Secondary | ICD-10-CM | POA: Diagnosis not present

## 2021-06-02 DIAGNOSIS — D692 Other nonthrombocytopenic purpura: Secondary | ICD-10-CM | POA: Diagnosis not present

## 2021-06-02 DIAGNOSIS — I10 Essential (primary) hypertension: Secondary | ICD-10-CM | POA: Diagnosis not present

## 2021-06-02 DIAGNOSIS — Z923 Personal history of irradiation: Secondary | ICD-10-CM | POA: Diagnosis not present

## 2021-06-02 DIAGNOSIS — C712 Malignant neoplasm of temporal lobe: Secondary | ICD-10-CM | POA: Diagnosis not present

## 2021-06-02 DIAGNOSIS — Z9221 Personal history of antineoplastic chemotherapy: Secondary | ICD-10-CM | POA: Diagnosis not present

## 2021-06-02 DIAGNOSIS — C9111 Chronic lymphocytic leukemia of B-cell type in remission: Secondary | ICD-10-CM | POA: Diagnosis not present

## 2021-06-09 DIAGNOSIS — R0600 Dyspnea, unspecified: Secondary | ICD-10-CM | POA: Diagnosis not present

## 2021-06-09 DIAGNOSIS — Z6824 Body mass index (BMI) 24.0-24.9, adult: Secondary | ICD-10-CM | POA: Diagnosis not present

## 2021-06-09 DIAGNOSIS — K2960 Other gastritis without bleeding: Secondary | ICD-10-CM | POA: Diagnosis not present

## 2021-06-09 DIAGNOSIS — I420 Dilated cardiomyopathy: Secondary | ICD-10-CM | POA: Diagnosis not present

## 2021-06-22 ENCOUNTER — Other Ambulatory Visit: Payer: Self-pay | Admitting: Radiation Therapy

## 2021-06-29 DIAGNOSIS — C719 Malignant neoplasm of brain, unspecified: Secondary | ICD-10-CM | POA: Diagnosis not present

## 2021-06-29 DIAGNOSIS — I10 Essential (primary) hypertension: Secondary | ICD-10-CM | POA: Diagnosis not present

## 2021-06-29 DIAGNOSIS — E782 Mixed hyperlipidemia: Secondary | ICD-10-CM | POA: Diagnosis not present

## 2021-07-02 ENCOUNTER — Other Ambulatory Visit: Payer: Self-pay

## 2021-07-02 ENCOUNTER — Ambulatory Visit (HOSPITAL_COMMUNITY)
Admission: RE | Admit: 2021-07-02 | Discharge: 2021-07-02 | Disposition: A | Discharge status: Home / Self Care | Payer: PPO | Source: Ambulatory Visit | Attending: Internal Medicine | Admitting: Internal Medicine

## 2021-07-02 DIAGNOSIS — C711 Malignant neoplasm of frontal lobe: Secondary | ICD-10-CM | POA: Insufficient documentation

## 2021-07-02 DIAGNOSIS — C719 Malignant neoplasm of brain, unspecified: Secondary | ICD-10-CM | POA: Diagnosis not present

## 2021-07-02 MED ORDER — GADOBUTROL 1 MMOL/ML IV SOLN
7.0000 mL | Freq: Once | INTRAVENOUS | Status: AC | PRN
  Administered 2021-07-02: 7 mL via INTRAVENOUS

## 2021-07-05 ENCOUNTER — Other Ambulatory Visit (HOSPITAL_COMMUNITY): Payer: Self-pay

## 2021-07-05 ENCOUNTER — Other Ambulatory Visit: Payer: Self-pay

## 2021-07-05 ENCOUNTER — Inpatient Hospital Stay: Payer: PPO

## 2021-07-05 ENCOUNTER — Inpatient Hospital Stay: Payer: PPO | Attending: Internal Medicine | Admitting: Internal Medicine

## 2021-07-05 VITALS — BP 128/72 | HR 71 | Temp 97.7°F | Resp 16 | Ht 64.0 in | Wt 163.5 lb

## 2021-07-05 DIAGNOSIS — C9110 Chronic lymphocytic leukemia of B-cell type not having achieved remission: Secondary | ICD-10-CM | POA: Diagnosis not present

## 2021-07-05 DIAGNOSIS — Z79899 Other long term (current) drug therapy: Secondary | ICD-10-CM | POA: Diagnosis not present

## 2021-07-05 DIAGNOSIS — R569 Unspecified convulsions: Secondary | ICD-10-CM

## 2021-07-05 DIAGNOSIS — C711 Malignant neoplasm of frontal lobe: Secondary | ICD-10-CM | POA: Insufficient documentation

## 2021-07-05 NOTE — Progress Notes (Signed)
[facility] at [address] [place], [place] [address] [phone]   Interval Evaluation  Date of Service: 07/05/21 Patient Name: Nathan Hall Patient MRN: [id number] Patient DOB: [birth year] Provider: Ventura Sellers, MD  Identifying Statement:  Nathan Hall is a 87 y.o. male with right temporal glioblastoma   Oncologic History: Oncology History  CLL (chronic lymphocytic leukemia) ([place])  05/30/2008 Cancer Staging   Staging form: Chronic Lymphocytic Leukemia / Small Lymphocytic Lymphoma, AJCC 8th Edition - Clinical stage from 05/30/2008: Modified Rai Stage 0 (Modified Rai risk: Low, Binet: Stage A, Lymphocytosis: Present, Adenopathy: Absent, Organomegaly: Absent, Anemia: Absent, Thrombocytopenia: Absent) - Signed by Derwood Kaplan, MD on 03/15/2021 Histopathologic type: B-cell lymphocytic leukemia/small lymphocytic lymphoma (see also M-9670/3) Stage prefix: Initial diagnosis Stage used in treatment planning: Yes National guidelines used in treatment planning: Yes Type of national guideline used in treatment planning: NCCN    05/01/2020 Initial Diagnosis   CLL (chronic lymphocytic leukemia) ([place])   Glioblastoma multiforme of frontal lobe ([place])  11/16/2020 Surgery   Craniotomy, resection of R temporal mass by Dr. Jearl Klinefelter; path demonstrates glioblastoma   11/16/2020 Cancer Staging   Staging form: Brain and Spinal Cord, AJCC 8th Edition - Pathologic stage from 11/16/2020: WHO Grade IV - Signed by Ventura Sellers, MD on 12/10/2020 Stage prefix: Initial diagnosis Histologic grading system: 4 grade system Extent of surgical resection: Complete (gross) resection Solitary (s) or multifocal (m) tumors in the primary site: Solitary Tumor location in brain: Noneloquent brain area Karnofsky performance status: Score 80 Seizures at presentation: Present Duration of symptoms before diagnosis: Short IDH1 mutation: Negative IDH2  mutation: Unknown 1p loss of heterozygosity (LOH): Unknown 19q loss of heterozygosity (LOH): Unknown MGMT methylation: Absent    12/21/2020 - 12/21/2020 Chemotherapy   Patient is on Treatment Plan : BRAIN GLIOBLASTOMA Radiation Therapy With Concurrent Temozolomide 75 mg/m2 Daily Followed By Sequential Maintenance Temozolomide x 6-12 cycles     02/28/2021 -  Chemotherapy   Patient is on Treatment Plan : BRAIN GLIOBLASTOMA Consolidation Temozolomide Days 1-5 q28 Days        Biomarkers:  MGMT Unmethylated.  IDH 1/2 Unknown.  EGFR Unknown  TERT Unknown   Interval History: Nathan Hall presents today for follow up after recent MRI brain.  He describes no changes today in terms of energy, continues on an overall trajectory of improvement in function.  Staying active and appetite is strong. No further seizures.  H+P (12/01/20) Patient presented to medical attention on 11/13/20 with first ever seizure.  He describes involuntary nocturnal twitching of the left side of his face accompanied by slurred speech.  This resolved prior to arriving at the ED later the next morning, more or less at his baseline.  CNS imaging demonstrated enhancing mass in the right temporal lobe consistent with tumor, this was resected by Dr. Zada Finders on 11/16/20; path was glioblastoma.  Today he feels more or less at baseline, but complains of excess fatigue compared to prior to surgery.  He likes to cut grass on his riding mower.  Medications: Current Outpatient Medications on File Prior to Visit  Medication Sig Dispense Refill   aspirin EC 81 MG tablet Take 1 tablet (81 mg total) by mouth daily. Restart on 11/23/20 90 tablet 3   atorvastatin (LIPITOR) 20 MG tablet Take 1 tablet (20 mg total) by mouth daily. 90 tablet 3   busPIRone (BUSPAR) 10 MG tablet Take 10 mg by mouth 2 (two)  times daily as needed (anxiety attacks).     carvedilol (COREG) 25 MG tablet Take 25 mg by mouth 2 (two) times daily.     CO ENZYME Q-10  PO Take 200 mg by mouth daily. Unknown strength     furosemide (LASIX) 40 MG tablet Take 1 tablet (40 mg total) by mouth daily as needed for edema. 90 tablet 3   levETIRAcetam (KEPPRA) 750 MG tablet Take 1 tablet (750 mg total) by mouth 2 (two) times daily. 60 tablet 3   mexiletine (MEXITIL) 150 MG capsule TAKE 1 CAPSULE BY MOUTH TWICE A DAY 180 capsule 2   nitroGLYCERIN (NITROSTAT) 0.4 MG SL tablet Place 1 tablet (0.4 mg total) under the tongue as needed for chest pain (For chest pain). 30 tablet 3   Omega-3 Fatty Acids (FISH OIL) 1000 MG CAPS Take 1 capsule (1,000 mg total) by mouth daily. 90 capsule 3   pantoprazole (PROTONIX) 40 MG tablet Take 40 mg by mouth daily.     potassium chloride SA (KLOR-CON) 20 MEQ tablet Take 1 tablet (20 mEq total) by mouth daily. 90 tablet 3   spironolactone (ALDACTONE) 25 MG tablet Take 0.5 tablets (12.5 mg total) by mouth daily. 45 tablet 3   No current facility-administered medications on file prior to visit.    Allergies:  Allergies  Allergen Reactions   Lisinopril Itching   Niaspan [Niacin Er] Itching   Simvastatin Itching   Past Medical History:  Past Medical History:  Diagnosis Date   Abnormal nuclear stress test 10/07/2016   Aftercare following surgery 02/22/2018   Cancer ([place])    Cardiomyopathy ([place]) 12/09/2014   Overview:  Ejection fraction 30% May 2018   Cataract    were removed   Cataract    were removed   CHF (congestive heart failure) ([place])    CLL (chronic lymphocytic leukemia) ([place])    Coronary artery disease    Coronary artery disease involving native coronary artery of native heart without angina pectoris 12/09/2014   Formatting of this note might be different from the original. LAD and Cx stents 2018 Formatting of this note might be different from the original. PTCA and drug-eluting stent to left anterior descending artery and circumflex artery in May 2018   Dyslipidemia 12/09/2014   Essential hypertension 12/09/2014   GERD  (gastroesophageal reflux disease)    Hyperlipidemia    Hypertension    Ingrown nail 02/15/2018   Intermittent lightheadedness 10/13/2016   LV dysfunction 04/11/2017   Pulmonary infiltrates on CXR 01/26/2017   Onset symptoms was late June 2018 and CT scan 12/05/16 c/w as dz LLL/ clear on f/u 01/26/2017  -        PVC's (premature ventricular contractions) 10/07/2016   Rhinitis, chronic 01/27/2017   Allergy profile 01/26/2017 >  Eos 0.1 /  IgE  53 RAST pos dust only    Past Surgical History:  Past Surgical History:  Procedure Laterality Date   APPENDECTOMY     APPLICATION OF CRANIAL NAVIGATION Right 11/16/2020   Procedure: APPLICATION OF CRANIAL NAVIGATION;  Surgeon: Judith Part, MD;  Location: [facility];  Service: Neurosurgery;  Laterality: Right;   BASAL CELL CARCINOMA EXCISION  10/31/2019   cardiac stents     CRANIOTOMY Right 11/16/2020   Procedure: CRANIOTOMY FOR TUMOR EXCISION;  Surgeon: Judith Part, MD;  Location: [facility];  Service: Neurosurgery;  Laterality: Right;   EYE SURGERY     HERNIA REPAIR     Social History:  Social History  Socioeconomic History   Marital status: Married    Spouse name: Not on file   Number of children: Not on file   Years of education: Not on file   Highest education level: Not on file  Occupational History   Not on file  Tobacco Use   Smoking status: Never   Smokeless tobacco: Never   Tobacco comments:    States years ago/did not inhale/brief period/can't remeber dates  Vaping Use   Vaping Use: Never used  Substance and Sexual Activity   Alcohol use: No   Drug use: No   Sexual activity: Not Currently  Other Topics Concern   Not on file  Social History Narrative   Not on file   Social Determinants of Health   Financial Resource Strain: Not on file  Food Insecurity: Not on file  Transportation Needs: Not on file  Physical Activity: Not on file  Stress: Not on file  Social Connections: Not on file  Intimate Partner Violence: Not  on file   Family History:  Family History  Problem Relation Age of Onset   Pneumonia Mother    Stomach cancer Father    Congestive Heart Failure Father    Heart attack Maternal Grandmother    Alzheimer's disease Maternal Grandfather    Cancer Paternal Grandmother    Heart Problems Sister     Review of Systems: Constitutional: Doesn't report fevers, chills or abnormal weight loss Eyes: Doesn't report blurriness of vision Ears, nose, mouth, throat, and face: Doesn't report sore throat Respiratory: Doesn't report cough, dyspnea or wheezes Cardiovascular: Doesn't report palpitation, chest discomfort  Gastrointestinal:  Doesn't report nausea, constipation, diarrhea GU: Doesn't report incontinence Skin: Doesn't report skin rashes Neurological: Per HPI Musculoskeletal: Doesn't report joint pain Behavioral/Psych: Doesn't report anxiety  Physical Exam: Vitals:   07/05/21 0917  BP: 128/72  Pulse: 71  Resp: 16  Temp: 97.7 F (36.5 C)  SpO2: 97%    KPS: 80. General: Alert, cooperative, pleasant, in no acute distress Head: Normal EENT: No conjunctival injection or scleral icterus.  Lungs: Resp effort normal Cardiac: Regular rate Abdomen: Non-distended abdomen Skin: No rashes cyanosis or petechiae. Extremities: No clubbing or edema  Neurologic Exam: Mental Status: Awake, alert, attentive to examiner. Oriented to self and environment. Language is fluent with intact comprehension.  Cranial Nerves: Visual acuity is grossly normal. Visual fields are full. Extra-ocular movements intact. No ptosis. Face is symmetric Motor: Tone and bulk are normal. Power is full in both arms and legs. Reflexes are symmetric, no pathologic reflexes present.  Sensory: Intact to light touch Gait: Normal.   Labs: I have reviewed the data as listed    Component Value Date/Time   NA 140 03/29/2021 0857   NA 139 03/10/2021 0000   K 4.7 03/29/2021 0857   CL 105 03/29/2021 0857   CO2 30 03/29/2021  0857   GLUCOSE 95 03/29/2021 0857   BUN 14 03/29/2021 0857   BUN 13 03/10/2021 0000   CREATININE 0.98 03/29/2021 0857   CALCIUM 9.1 03/29/2021 0857   PROT 6.0 (L) 03/29/2021 0857   PROT 6.0 03/29/2018 1103   ALBUMIN 3.6 03/29/2021 0857   ALBUMIN 4.1 03/29/2018 1103   AST 17 03/29/2021 0857   ALT 13 03/29/2021 0857   ALKPHOS 68 03/29/2021 0857   BILITOT 0.9 03/29/2021 0857   GFRNONAA >60 03/29/2021 0857   GFRAA 63 05/04/2020 0000   Lab Results  Component Value Date   WBC 6.5 03/29/2021   NEUTROABS 3.3 03/29/2021  HGB 13.4 03/29/2021   HCT 39.8 03/29/2021   MCV 96.1 03/29/2021   PLT 187 03/29/2021    Imaging:  [facility] Clinician Interpretation: I have personally reviewed the CNS images as listed.  My interpretation, in the context of the patient's clinical presentation, is treatment effect vs true progression  MR BRAIN W WO CONTRAST  Result Date: 07/02/2021 CLINICAL DATA:  Brain/CNS neoplasm, assess treatment response; glioblastoma EXAM: MRI HEAD WITHOUT AND WITH CONTRAST TECHNIQUE: Multiplanar, multiecho pulse sequences of the brain and surrounding structures were obtained without and with intravenous contrast. CONTRAST:  32m GADAVIST GADOBUTROL 1 MMOL/ML IV SOLN COMPARISON:  04/29/2021 FINDINGS: Brain: Small area of enhancement underlying the craniotomy in the far lateral perirolandic region has increased (see series 17, image 16 versus series 19, image 16 on prior). There is also new ill-defined enhancement extending more superiorly from this region (see series 16, image 99). Extent of T2 FLAIR hyperintensity in the right cerebral hemisphere has slightly increased. For example, see series 11, images 26, 37, and 38. These areas do not correspond to the above enhancement. There is no acute infarction or intracranial hemorrhage. Stable prominence of ventricles and sulci reflecting parenchymal volume loss. Additional patchy and confluent areas of T2 hyperintensity in the supratentorial  white matter may reflect chronic microvascular ischemic changes. Vascular: Major vessel flow voids at the skull base are preserved. Skull and upper cervical spine: Normal marrow signal is preserved. Right craniotomy. Sinuses/Orbits: Patchy mucosal thickening. Bilateral lens replacements. Other: Sella is unremarkable.  Mastoid air cells are clear. IMPRESSION: Small enhancement at the upper side has increased with new ill-defined enhancement extending more superiorly. Slight increase in extent of T2 FLAIR hyperintensity in the right cerebral hemisphere. Continue close follow-up to distinguish progression and treatment change. Electronically Signed   By: PMacy MisM.D.   On: 07/02/2021 10:16     Assessment/Plan Glioblastoma multiforme of frontal lobe ([place] [C71.1]  WRosendo Grosis clinically stable today.  Brain MRI demonstrates modest progression superior to resection site, best appreciated on coronal sequences.  There is no mass affect apparent.  Etiology may be pseudo-progression, given timing from completion of IMRT.    We discussed obtaining a shorter interval MRI (6 weeks) or resuming Temodar.   For now, given lack of symptoms and poor tolerance previously, he would prefer to remain off chemo; in that case we will repeat imaging shortly as discussed.  Should continue Keppra 5060mBID, remain off dexamethasone as prior.  We ask that Nathan Groseturn to clinic in 6 weeks with MRI for evluation.  All questions were answered. The patient knows to call the clinic with any problems, questions or concerns. No barriers to learning were detected.  The total time spent in the encounter was 40 minutes and more than 50% was on counseling and review of test results   ZaVentura SellersMD Medical Director of Neuro-Oncology [facility] [place]2/06/23 10:26 AM

## 2021-07-06 ENCOUNTER — Other Ambulatory Visit (HOSPITAL_COMMUNITY): Payer: Self-pay

## 2021-07-06 ENCOUNTER — Telehealth: Payer: Self-pay | Admitting: Internal Medicine

## 2021-07-06 NOTE — Telephone Encounter (Signed)
Scheduled per 2/6 los pt has been called and confirmed

## 2021-07-27 DIAGNOSIS — E782 Mixed hyperlipidemia: Secondary | ICD-10-CM | POA: Diagnosis not present

## 2021-07-27 DIAGNOSIS — I10 Essential (primary) hypertension: Secondary | ICD-10-CM | POA: Diagnosis not present

## 2021-07-27 DIAGNOSIS — C719 Malignant neoplasm of brain, unspecified: Secondary | ICD-10-CM | POA: Diagnosis not present

## 2021-07-29 ENCOUNTER — Other Ambulatory Visit: Payer: Self-pay | Admitting: *Deleted

## 2021-07-29 DIAGNOSIS — C9111 Chronic lymphocytic leukemia of B-cell type in remission: Secondary | ICD-10-CM | POA: Diagnosis not present

## 2021-07-29 DIAGNOSIS — I10 Essential (primary) hypertension: Secondary | ICD-10-CM | POA: Diagnosis not present

## 2021-07-29 DIAGNOSIS — C712 Malignant neoplasm of temporal lobe: Secondary | ICD-10-CM | POA: Diagnosis not present

## 2021-07-29 DIAGNOSIS — D692 Other nonthrombocytopenic purpura: Secondary | ICD-10-CM | POA: Diagnosis not present

## 2021-07-29 DIAGNOSIS — Z515 Encounter for palliative care: Secondary | ICD-10-CM | POA: Diagnosis not present

## 2021-07-29 DIAGNOSIS — Z923 Personal history of irradiation: Secondary | ICD-10-CM | POA: Diagnosis not present

## 2021-07-29 DIAGNOSIS — Z9221 Personal history of antineoplastic chemotherapy: Secondary | ICD-10-CM | POA: Diagnosis not present

## 2021-07-29 DIAGNOSIS — I700 Atherosclerosis of aorta: Secondary | ICD-10-CM | POA: Diagnosis not present

## 2021-07-29 MED ORDER — LEVETIRACETAM 750 MG PO TABS: 750.0000 mg | ORAL_TABLET | Freq: Two times a day (BID) | ORAL | 3 refills | Status: DC

## 2021-08-07 ENCOUNTER — Ambulatory Visit (HOSPITAL_COMMUNITY)
Admission: RE | Admit: 2021-08-07 | Discharge: 2021-08-07 | Disposition: A | Discharge status: Home / Self Care | Payer: PPO | Source: Ambulatory Visit | Attending: Internal Medicine | Admitting: Internal Medicine

## 2021-08-07 ENCOUNTER — Other Ambulatory Visit: Payer: Self-pay

## 2021-08-07 DIAGNOSIS — C711 Malignant neoplasm of frontal lobe: Secondary | ICD-10-CM | POA: Insufficient documentation

## 2021-08-07 DIAGNOSIS — R220 Localized swelling, mass and lump, head: Secondary | ICD-10-CM | POA: Diagnosis not present

## 2021-08-07 MED ORDER — GADOBUTROL 1 MMOL/ML IV SOLN
7.5000 mL | Freq: Once | INTRAVENOUS | Status: AC | PRN
  Administered 2021-08-07: 7.5 mL via INTRAVENOUS

## 2021-08-11 ENCOUNTER — Telehealth: Payer: Self-pay

## 2021-08-11 NOTE — Telephone Encounter (Signed)
Pt's wife called to say that on 08/08/21 during apt for MRI, and  at 2:30 AM today ,pt had "seizure-like" episode that lasted about 2 minutes. She reports "mouth trembling and his eyes were funny". On both occasions pt evaluated and vital signs stable and blood glucose normal. ? ?Patient has appointment with Dr. Mickeal Skinner on 08/16/21. She will call office if patient experiences any more symptoms. ?

## 2021-08-11 NOTE — Telephone Encounter (Signed)
Called wife to let her know that Dr. Mickeal Skinner aware of seizure like activity and no changes to medications needed at this time. ?

## 2021-08-16 ENCOUNTER — Other Ambulatory Visit: Payer: Self-pay

## 2021-08-16 ENCOUNTER — Inpatient Hospital Stay: Payer: PPO | Attending: Internal Medicine | Admitting: Internal Medicine

## 2021-08-16 VITALS — BP 143/78 | HR 83 | Temp 98.1°F | Resp 17 | Wt 166.8 lb

## 2021-08-16 DIAGNOSIS — R569 Unspecified convulsions: Secondary | ICD-10-CM

## 2021-08-16 DIAGNOSIS — C9110 Chronic lymphocytic leukemia of B-cell type not having achieved remission: Secondary | ICD-10-CM | POA: Diagnosis not present

## 2021-08-16 DIAGNOSIS — Z79899 Other long term (current) drug therapy: Secondary | ICD-10-CM | POA: Insufficient documentation

## 2021-08-16 DIAGNOSIS — Z7189 Other specified counseling: Secondary | ICD-10-CM | POA: Insufficient documentation

## 2021-08-16 DIAGNOSIS — C711 Malignant neoplasm of frontal lobe: Secondary | ICD-10-CM | POA: Insufficient documentation

## 2021-08-16 MED ORDER — LEVETIRACETAM 1000 MG PO TABS: 1000.0000 mg | ORAL_TABLET | Freq: Two times a day (BID) | ORAL | 3 refills | Status: AC

## 2021-08-16 NOTE — Progress Notes (Signed)
? ?[facility] [address]  ?[place], [place] [address] ?[phone] ? ? ?Interval Evaluation ? ?Date of Service: 08/16/21 ?Patient Name: Nathan Hall ?Patient MRN: [id number] ?Patient DOB: 1934/07/25 ?Provider: Ventura Sellers, MD ? ?Identifying Statement:  ?Nathan Hall is a 87 y.o. male with right temporal glioblastoma  ? ?Oncologic History: ?Oncology History  ?CLL (chronic lymphocytic leukemia) ([place])  ?05/30/2008 Cancer Staging  ? Staging form: Chronic Lymphocytic Leukemia / Small Lymphocytic Lymphoma, AJCC 8th Edition ?- Clinical stage from 05/30/2008: Modified Rai Stage 0 (Modified Rai risk: Low, Binet: Stage A, Lymphocytosis: Present, Adenopathy: Absent, Organomegaly: Absent, Anemia: Absent, Thrombocytopenia: Absent) - Signed by Derwood Kaplan, MD on 03/15/2021 ?Histopathologic type: B-cell lymphocytic leukemia/small lymphocytic lymphoma (see also M-9670/3) ?Stage prefix: Initial diagnosis ?Stage used in treatment planning: Yes ?National guidelines used in treatment planning: Yes ?Type of national guideline used in treatment planning: NCCN ? ?  ?05/01/2020 Initial Diagnosis  ? CLL (chronic lymphocytic leukemia) ([place]) ?  ?Glioblastoma multiforme of frontal lobe ([place])  ?11/16/2020 Surgery  ? Craniotomy, resection of R temporal mass by Dr. Jearl Klinefelter; path demonstrates glioblastoma ?  ?11/16/2020 Cancer Staging  ? Staging form: Brain and Spinal Cord, AJCC 8th Edition ?- Pathologic stage from 11/16/2020: WHO Grade IV - Signed by Ventura Sellers, MD on 12/10/2020 ?Stage prefix: Initial diagnosis ?Histologic grading system: 4 grade system ?Extent of surgical resection: Complete (gross) resection ?Solitary (s) or multifocal (m) tumors in the primary site: Solitary ?Tumor location in brain: Noneloquent brain area ?Karnofsky performance status: Score 80 ?Seizures at presentation: Present ?Duration of symptoms before diagnosis: Short ?IDH1 mutation: Negative ?IDH2  mutation: Unknown ?1p loss of heterozygosity (LOH): Unknown ?19q loss of heterozygosity (LOH): Unknown ?MGMT methylation: Absent ? ?  ?12/21/2020 - 12/21/2020 Chemotherapy  ? Patient is on Treatment Plan : BRAIN GLIOBLASTOMA Radiation Therapy With Concurrent Temozolomide 75 mg/m2 Daily Followed By Sequential Maintenance Temozolomide x 6-12 cycles  ?   ?02/28/2021 -  Chemotherapy  ? Patient is on Treatment Plan : BRAIN GLIOBLASTOMA Consolidation Temozolomide Days 1-5 q28 Days   ?   ? ? ?Biomarkers: ? ?MGMT Unmethylated.  ?IDH 1/2 Unknown.  ?EGFR Unknown  ?TERT Unknown  ? ?Interval History: ?Nathan Hall presents today for follow up after recent MRI brain.  Patient and family describe two breakthrough seizures this past 2 weeks, with no clear provocation.  Fatigue has increased, he sleeping heavily throughout much of the day.  Also describes some impairment with communication, expressive language.  No issues with gait, balance.  He has not been especially active physically in recent weeks with the colder weather. ? ?H+P (12/01/20) Patient presented to medical attention on 11/13/20 with first ever seizure.  He describes involuntary nocturnal twitching of the left side of his face accompanied by slurred speech.  This resolved prior to arriving at the ED later the next morning, more or less at his baseline.  CNS imaging demonstrated enhancing mass in the right temporal lobe consistent with tumor, this was resected by Dr. Zada Finders on 11/16/20; path was glioblastoma.  Today he feels more or less at baseline, but complains of excess fatigue compared to prior to surgery.  He likes to cut grass on his riding mower. ? ?Medications: ?Current Outpatient Medications on File Prior to Visit  ?Medication Sig Dispense Refill  ? aspirin EC 81 MG tablet Take 1 tablet (81 mg total) by mouth daily. Restart on 11/23/20 90 tablet 3  ? atorvastatin (LIPITOR)  20 MG tablet Take 1 tablet (20 mg total) by mouth daily. 90 tablet 3  ? busPIRone  (BUSPAR) 10 MG tablet Take 10 mg by mouth 2 (two) times daily as needed (anxiety attacks).    ? carvedilol (COREG) 25 MG tablet Take 25 mg by mouth 2 (two) times daily.    ? CO ENZYME Q-10 PO Take 200 mg by mouth daily. Unknown strength    ? furosemide (LASIX) 40 MG tablet Take 1 tablet (40 mg total) by mouth daily as needed for edema. 90 tablet 3  ? mexiletine (MEXITIL) 150 MG capsule TAKE 1 CAPSULE BY MOUTH TWICE A DAY 180 capsule 2  ? nitroGLYCERIN (NITROSTAT) 0.4 MG SL tablet Place 1 tablet (0.4 mg total) under the tongue as needed for chest pain (For chest pain). 30 tablet 3  ? Omega-3 Fatty Acids (FISH OIL) 1000 MG CAPS Take 1 capsule (1,000 mg total) by mouth daily. 90 capsule 3  ? pantoprazole (PROTONIX) 40 MG tablet Take 40 mg by mouth daily.    ? potassium chloride SA (KLOR-CON) 20 MEQ tablet Take 1 tablet (20 mEq total) by mouth daily. 90 tablet 3  ? spironolactone (ALDACTONE) 25 MG tablet Take 0.5 tablets (12.5 mg total) by mouth daily. 45 tablet 3  ? ?No current facility-administered medications on file prior to visit.  ? ? ?Allergies:  ?Allergies  ?Allergen Reactions  ? Lisinopril Itching  ? Niaspan [Niacin Er] Itching  ? Simvastatin Itching  ? ?Past Medical History:  ?Past Medical History:  ?Diagnosis Date  ? Abnormal nuclear stress test 10/07/2016  ? Aftercare following surgery 02/22/2018  ? Cancer [facility])   ? Cardiomyopathy ([place]) 12/09/2014  ? Overview:  Ejection fraction 30% May 2018  ? Cataract   ? were removed  ? Cataract   ? were removed  ? CHF (congestive heart failure) ([place])   ? CLL (chronic lymphocytic leukemia) ([place])   ? Coronary artery disease   ? Coronary artery disease involving native coronary artery of native heart without angina pectoris 12/09/2014  ? Formatting of this note might be different from the original. LAD and Cx stents 2018 Formatting of this note might be different from the original. PTCA and drug-eluting stent to left anterior descending artery and circumflex artery in May 2018   ? Dyslipidemia 12/09/2014  ? Essential hypertension 12/09/2014  ? GERD (gastroesophageal reflux disease)   ? Hyperlipidemia   ? Hypertension   ? Ingrown nail 02/15/2018  ? Intermittent lightheadedness 10/13/2016  ? LV dysfunction 04/11/2017  ? Pulmonary infiltrates on CXR 01/26/2017  ? Onset symptoms was late June 2018 and CT scan 12/05/16 c/w as dz LLL/ clear on f/u 01/26/2017  -       ? PVC's (premature ventricular contractions) 10/07/2016  ? Rhinitis, chronic 01/27/2017  ? Allergy profile 01/26/2017 >  Eos 0.1 /  IgE  53 RAST pos dust only   ? ?Past Surgical History:  ?Past Surgical History:  ?Procedure Laterality Date  ? APPENDECTOMY    ? APPLICATION OF CRANIAL NAVIGATION Right 11/16/2020  ? Procedure: APPLICATION OF CRANIAL NAVIGATION;  Surgeon: Judith Part, MD;  Location: [facility];  Service: Neurosurgery;  Laterality: Right;  ? BASAL CELL CARCINOMA EXCISION  10/31/2019  ? cardiac stents    ? CRANIOTOMY Right 11/16/2020  ? Procedure: CRANIOTOMY FOR TUMOR EXCISION;  Surgeon: Judith Part, MD;  Location: [facility];  Service: Neurosurgery;  Laterality: Right;  ? EYE SURGERY    ? HERNIA REPAIR    ? ?  Social History:  ?Social History  ? ?Socioeconomic History  ? Marital status: Married  ?  Spouse name: Not on file  ? Number of children: Not on file  ? Years of education: Not on file  ? Highest education level: Not on file  ?Occupational History  ? Not on file  ?Tobacco Use  ? Smoking status: Never  ? Smokeless tobacco: Never  ? Tobacco comments:  ?  States years ago/did not inhale/brief period/can't remeber dates  ?Vaping Use  ? Vaping Use: Never used  ?Substance and Sexual Activity  ? Alcohol use: No  ? Drug use: No  ? Sexual activity: Not Currently  ?Other Topics Concern  ? Not on file  ?Social History Narrative  ? Not on file  ? ?Social Determinants of Health  ? ?Financial Resource Strain: Not on file  ?Food Insecurity: Not on file  ?Transportation Needs: Not on file  ?Physical Activity: Not on file  ?Stress: Not on  file  ?Social Connections: Not on file  ?Intimate Partner Violence: Not on file  ? ?Family History:  ?Family History  ?Problem Relation Age of Onset  ? Pneumonia Mother   ? Stomach cancer Father   ? Conges

## 2021-08-19 ENCOUNTER — Telehealth: Payer: Self-pay | Admitting: *Deleted

## 2021-08-19 NOTE — Telephone Encounter (Signed)
Patients daughter called and wanted to let us know that they have discussed the options that were given to the patient from his visit on Monday. ? ?Surgery is not an option they want.  They have an additional question that they hope will help with deciding if chemo or hospice care is appropriate. ? ?She understands that this would be an educated guess but wants to know what % could this work for him or how many months could this prolong his life with a level of quality?   ? ?Routing to provider to advise.  I can call patient or he can discuss with daughter Blair Promise if he would like. ?

## 2021-08-23 DIAGNOSIS — C712 Malignant neoplasm of temporal lobe: Secondary | ICD-10-CM | POA: Diagnosis not present

## 2021-08-23 DIAGNOSIS — Z9889 Other specified postprocedural states: Secondary | ICD-10-CM | POA: Diagnosis not present

## 2021-08-23 DIAGNOSIS — Z923 Personal history of irradiation: Secondary | ICD-10-CM | POA: Diagnosis not present

## 2021-08-23 DIAGNOSIS — Z9221 Personal history of antineoplastic chemotherapy: Secondary | ICD-10-CM | POA: Diagnosis not present

## 2021-08-23 DIAGNOSIS — Z79630 Long term (current) use of alkylating agent: Secondary | ICD-10-CM | POA: Diagnosis not present

## 2021-08-27 ENCOUNTER — Telehealth: Payer: Self-pay

## 2021-08-27 DIAGNOSIS — I10 Essential (primary) hypertension: Secondary | ICD-10-CM | POA: Diagnosis not present

## 2021-08-27 DIAGNOSIS — E782 Mixed hyperlipidemia: Secondary | ICD-10-CM | POA: Diagnosis not present

## 2021-08-27 DIAGNOSIS — C719 Malignant neoplasm of brain, unspecified: Secondary | ICD-10-CM | POA: Diagnosis not present

## 2021-08-27 NOTE — Telephone Encounter (Signed)
Patient's daughter called. Pt had seizure that lasted 10 minutes today. Family will discuss hospice referral over the weekend and will call office on 08/30/21 with final decision to move forward.  ?

## 2021-08-30 ENCOUNTER — Telehealth: Payer: Self-pay | Admitting: *Deleted

## 2021-08-30 NOTE — Telephone Encounter (Signed)
Daughter called requesting that Dr Mickeal Skinner call patients wife to explain his options and explain in  more detail what Hospice care would entail and if that is the best recommendation. ? ?Best phone # to call [phone]  ? ?Routing to Dr Mickeal Skinner. ?

## 2021-08-30 NOTE — Telephone Encounter (Signed)
Dr Mickeal Skinner spoke with family.  Referral placed to [facility].   ?

## 2021-09-21 ENCOUNTER — Ambulatory Visit: Payer: PPO | Admitting: Cardiology

## 2021-09-27 DEATH — deceased

## 2022-03-10 ENCOUNTER — Other Ambulatory Visit: Payer: PPO

## 2022-03-10 ENCOUNTER — Ambulatory Visit: Payer: PPO | Admitting: Oncology

## 2023-05-29 IMAGING — MR MR HEAD WO/W CM
16 of 18 series · 42 of 48 positions shown · IV contrast (gadavist)
Comparison: Truncated brain MRI without contrast 11/13/2020. CT
head and neck that day.

CLINICAL DATA: 85-year-old male with seizure like activity. History
of hypertension and vascular disease.

EXAM:
MRI HEAD WITHOUT AND WITH CONTRAST
TECHNIQUE: Multiplanar, multiecho pulse sequences of the brain and surrounding
structures were obtained without and with intravenous contrast.
CONTRAST:  7.5mL GADAVIST GADOBUTROL 1 MMOL/ML IV SOLN

[Series 5: DWI · axial · 3.0mm · 0.88mm/px · z∈[-62,+84]mm · 7 of 104 slices shown (1 of 4)]
[im 1/104]
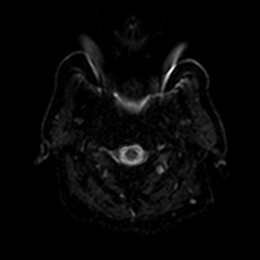
[im 18/104]
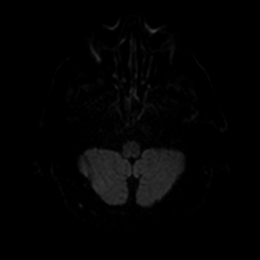
[im 35/104]
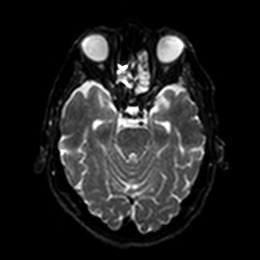
[im 52/104]
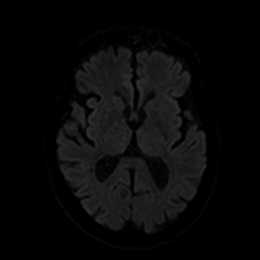
[im 69/104]
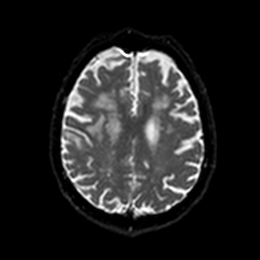
[im 86/104]
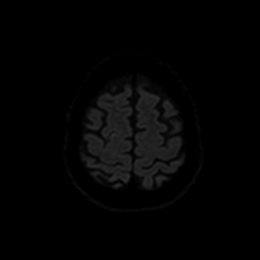
[im 104/104]
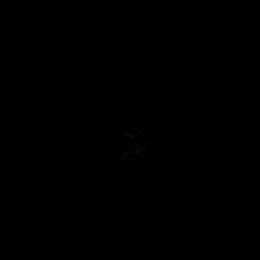

[Series 6: DWI · axial · 3.0mm · 0.88mm/px · z∈[-62,+84]mm · 4 of 52 slices shown (2 of 4)]
[im 1/52]
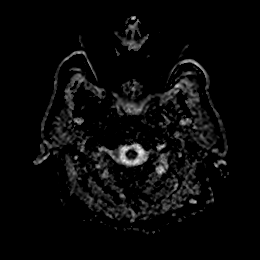
[im 18/52]
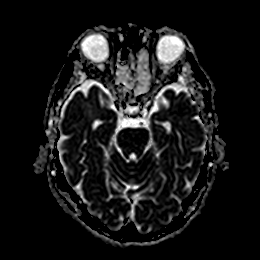
[im 35/52]
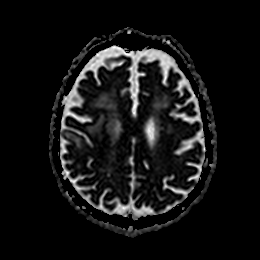
[im 52/52]
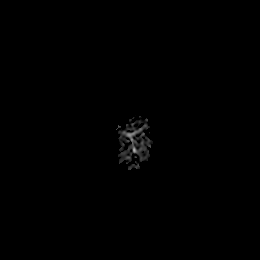

[Series 7: DWI · coronal · 4.0mm · 0.88mm/px · 5 of 74 slices shown (3 of 4)]
[im 1/74]
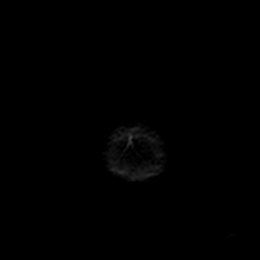
[im 19/74]
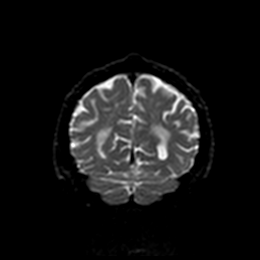
[im 37/74]
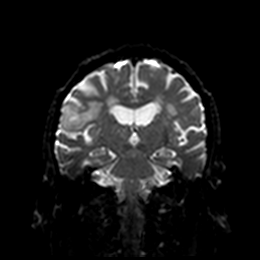
[im 55/74]
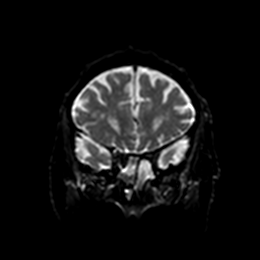
[im 74/74]
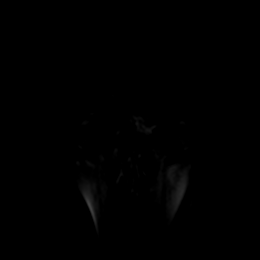

[Series 8: DWI · coronal · 4.0mm · 0.88mm/px · 2 of 37 slices shown (4 of 4)]
[im 1/37]
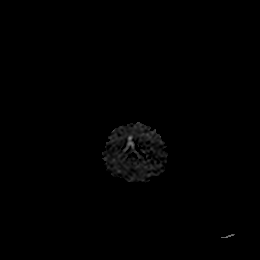
[im 37/37]
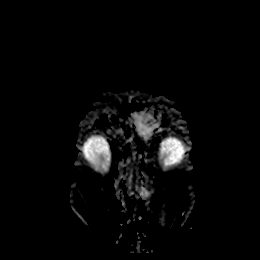

[Series 9: FLAIR · axial · 5.0mm · 0.45mm/px · 1 of 25 slices shown (1 of 2)]
[im 1/25]
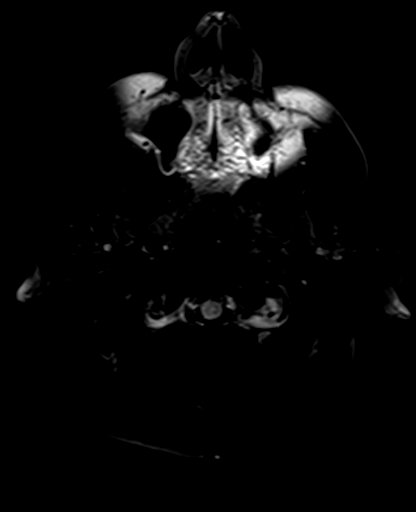

[Series 10: mag_images · axial · 3.0mm · 0.90mm/px · z∈[-67,+90]mm · 3 of 56 slices shown]
[im 1/56]
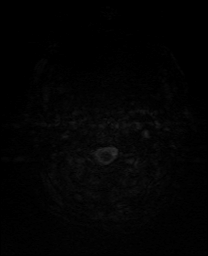
[im 28/56]
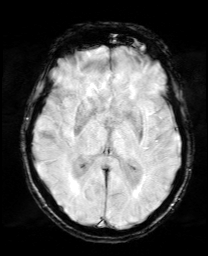
[im 56/56]
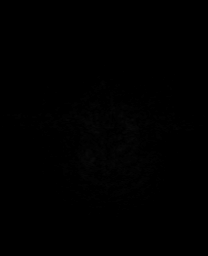

[Series 11: pha_images · axial · 3.0mm · 0.90mm/px · z∈[-67,+84]mm · 3 of 54 slices shown]
[im 1/54]
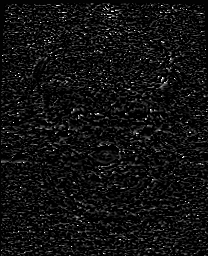
[im 27/54]
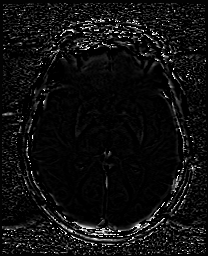
[im 54/54]
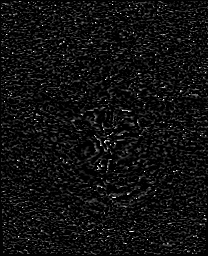

[Series 12: swi_images · axial · 3.0mm · 0.90mm/px · z∈[-67,+90]mm · 3 of 56 slices shown]
[im 1/56]
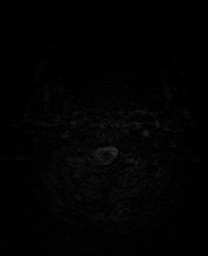
[im 28/56]
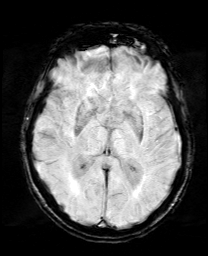
[im 56/56]
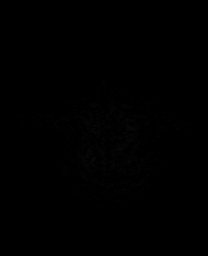

[Series 13: mip_images(sw) · axial · 24.0mm · 0.90mm/px · z∈[-57,+80]mm · 3 of 49 slices shown]
[im 1/49]
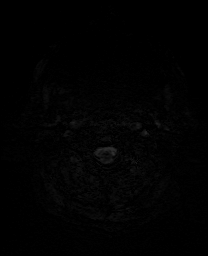
[im 25/49]
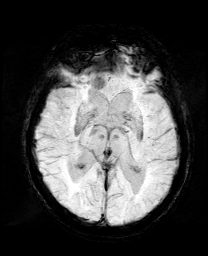
[im 49/49]
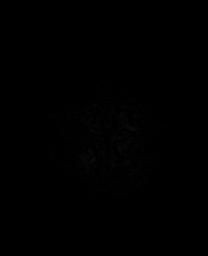

[Series 14: T1 · sagittal · 5.0mm · 0.75mm/px · 1 of 25 slices shown]
[im 1/25]
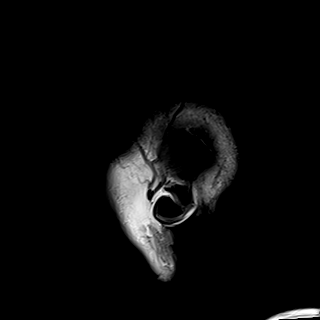

[Series 15: T2 · axial · 5.0mm · 0.72mm/px · 1 of 25 slices shown (1 of 2)]
[im 1/25]
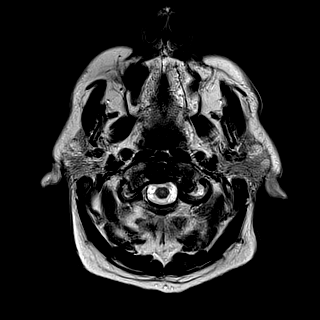

[Series 16: T2 · coronal · 3.0mm · 0.27mm/px · 2 of 32 slices shown (2 of 2)]
[im 1/32]
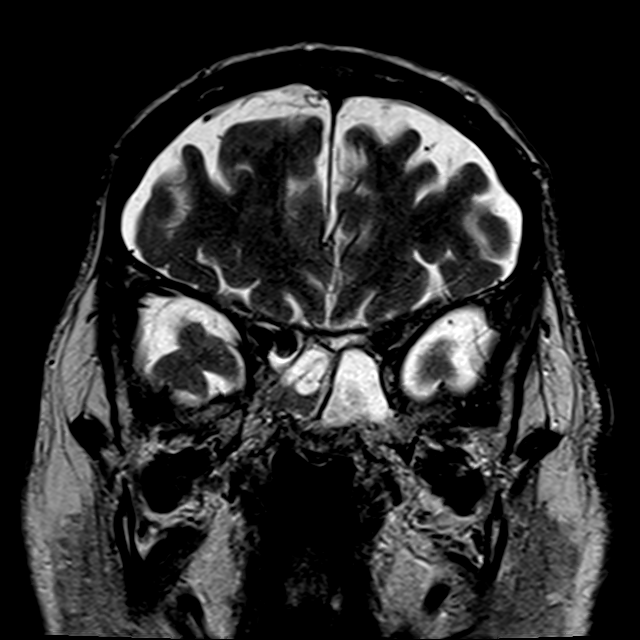
[im 32/32]
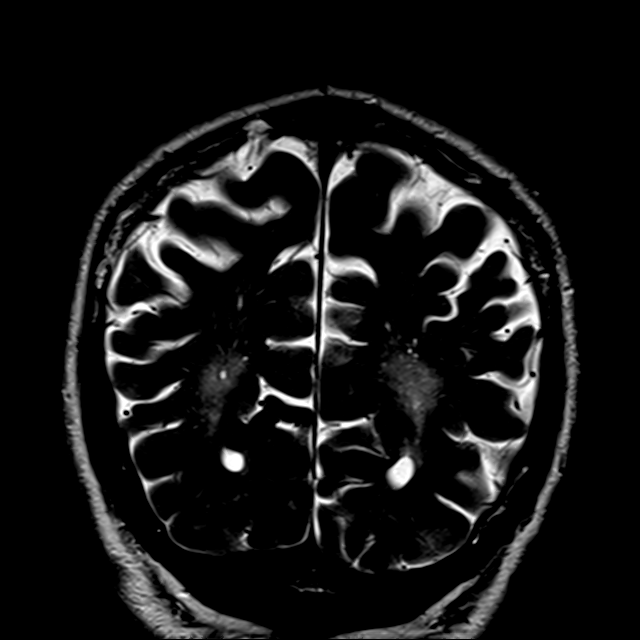

[Series 17: FLAIR · coronal · 3.0mm · 0.56mm/px · 2 of 32 slices shown (2 of 2)]
[im 1/32]
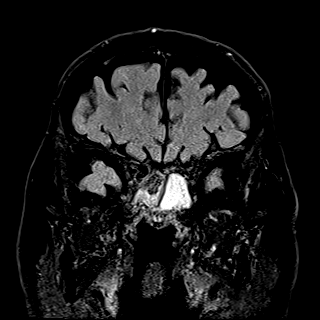
[im 32/32]
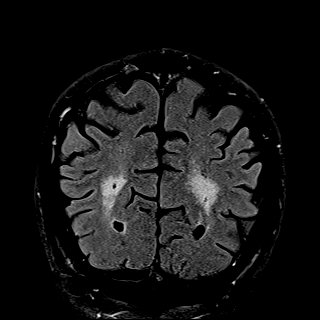

[Series 19: T2 post-contrast · coronal · 5.0mm · 0.72mm/px · 2 of 30 slices shown]
[im 1/30]
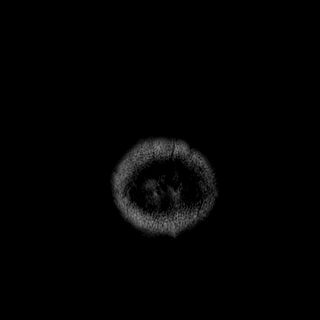
[im 30/30]
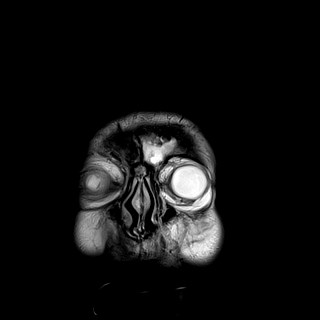

[Series 21: T1 post-contrast · coronal · 5.0mm · 0.34mm/px · 2 of 30 slices shown (1 of 2)]
[im 1/30]
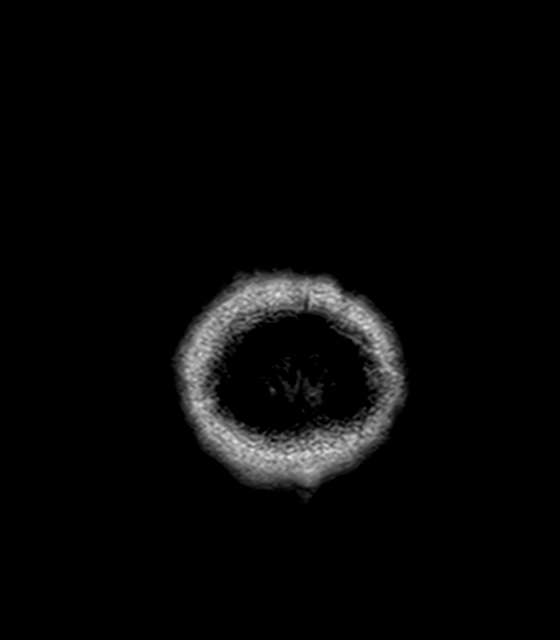
[im 30/30]
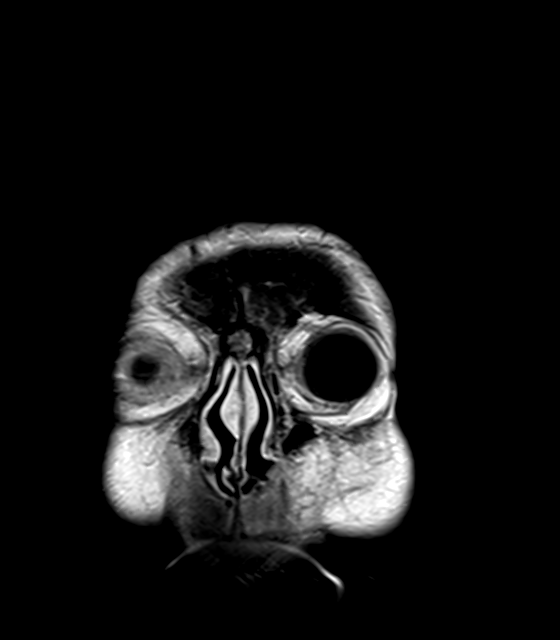

[Series 22: T1 post-contrast · sagittal · 5.0mm · 0.75mm/px · 1 of 25 slices shown (2 of 2)]
[im 1/25]
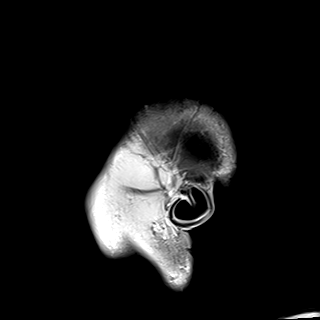

[42 of 48 positions shown; findings below may reference images not displayed]

FINDINGS: Brain: Solitary round rim enhancing mass measuring up to 17 mm long
axis is centered at the right frontal operculum (series 20, image
31) with T2 and FLAIR hyperintensity within the mass and regional
white matter T2 and FLAIR hyperintensity, mild gyral edema. Some of
the regional cortex is also abnormally T2 and FLAIR hyperintense.
But the pattern most resembles vasogenic edema. No associated
diffusion changes.

No other abnormal intracranial enhancement or similar mass lesion.

No restricted diffusion to suggest acute infarction. No midline
shift, ventriculomegaly, extra-axial collection or acute
intracranial hemorrhage. Cervicomedullary junction and pituitary are
within normal limits.

Outside of the right frontal operculum there is patchy and widely
scattered bilateral nonenhancing cerebral white matter T2 and FLAIR
hyperintensity, most resembling chronic small vessel disease. Mild
associated T2 heterogeneity in the right caudate nucleus. No
cortical encephalomalacia or chronic cerebral blood products are
identified.

Vascular: Major intracranial vascular flow voids are preserved. The
major dural venous sinuses are enhancing and appear to be patent.

Skull and upper cervical spine: Negative visible cervical spine and
spinal cord. Visualized bone marrow signal is within normal limits.

Sinuses/Orbits: Postoperative changes to both globes. Chronic
paranasal sinusitis with extensive bilateral ethmoid and sphenoid
sinus involvement. Associated mucosal hyperenhancement. No other
complicating features.

Other: Mastoids and visible internal auditory structures appear
negative. Negative visible scalp and face soft tissues.
IMPRESSION: 1. Solitary 17 mm rim enhancing mass in the right frontal operculum
with regional edema.
Favor a solitary Brain Metastasis over other differential
considerations such as High-grade Glioma.
Recommend follow-up CT Chest, Abdomen, and Pelvis to evaluate for a
primary malignancy.

2. No other acute intracranial abnormality or abnormal enhancement.
Additional signal changes in the bilateral white matter and right
caudate nucleus most commonly due to chronic small vessel disease.

3. Chronic paranasal sinusitis. No other complicating features.
